# Patient Record
Sex: Female | Born: 1970 | Race: White | Hispanic: No | Marital: Married | Smoking: Never smoker
Health system: Southern US, Academic
[De-identification: ages and names within clinical notes are randomized; demographics above are authoritative.]

## PROBLEM LIST (undated history)

## (undated) DIAGNOSIS — G2581 Restless legs syndrome: Secondary | ICD-10-CM

## (undated) DIAGNOSIS — F329 Major depressive disorder, single episode, unspecified: Secondary | ICD-10-CM

## (undated) HISTORY — DX: Major depressive disorder, single episode, unspecified: F32.9

## (undated) HISTORY — DX: Restless legs syndrome: G25.81

---

## 1993-10-09 ENCOUNTER — Other Ambulatory Visit (HOSPITAL_COMMUNITY): Payer: Self-pay

## 2015-08-21 IMAGING — MG 2D SCREENING MAMMO ONLY
1 series · 4 of 4 positions shown · non-contrast
Comparison: 

------------- REPORT [id number] -------------
TIGER, ALREEM

TIGER, AZETA
Exam:  
Screening digital mammogram with CAD
INDICATION: Annual screening.

[Series 441: R CC · oblique · right · 4 of 6 slices shown]
[im 1/6]
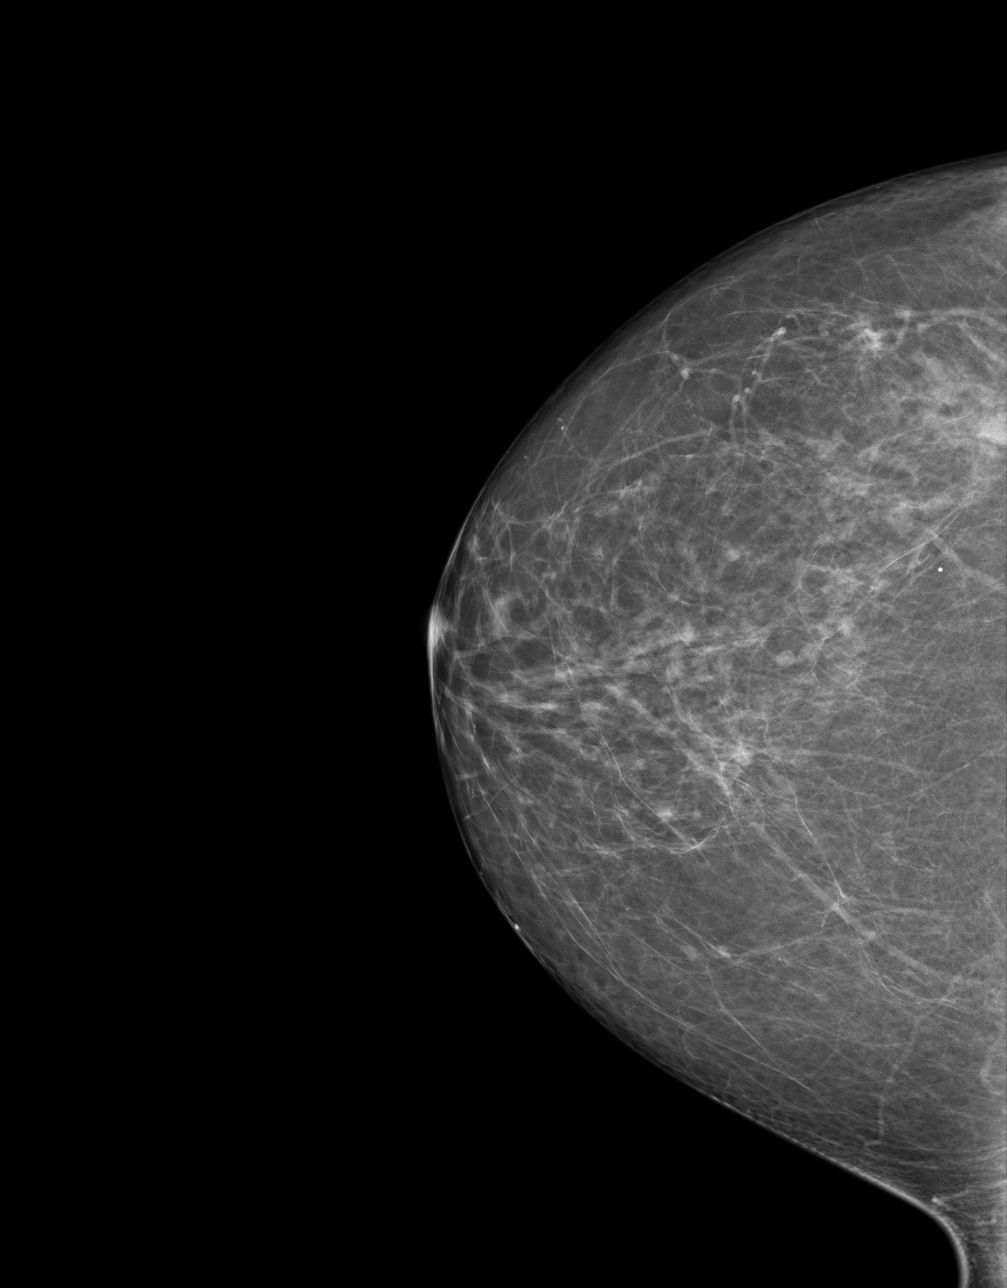
[im 2/6]
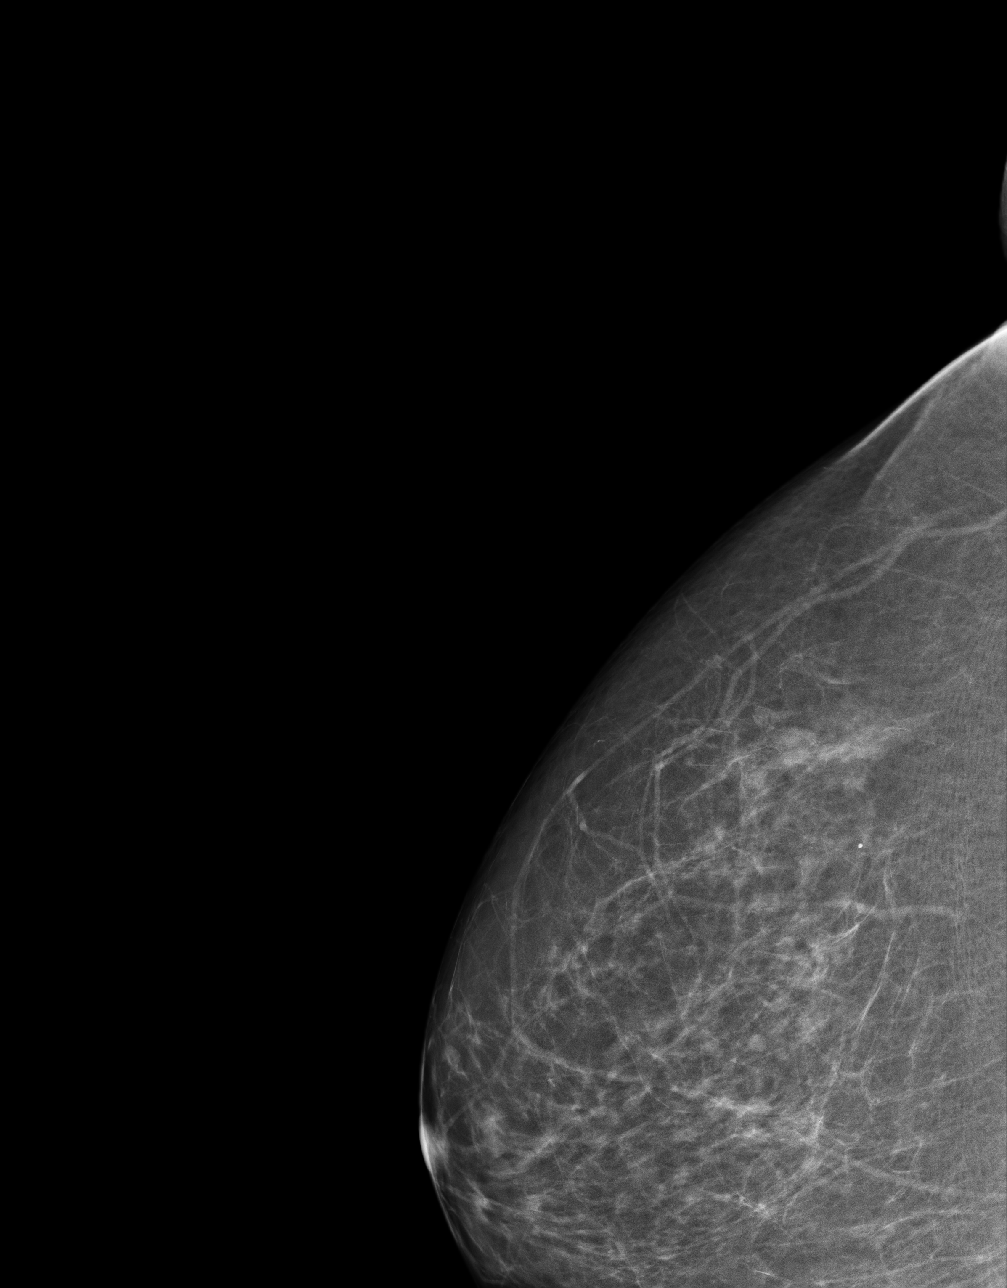
[im 4/6]
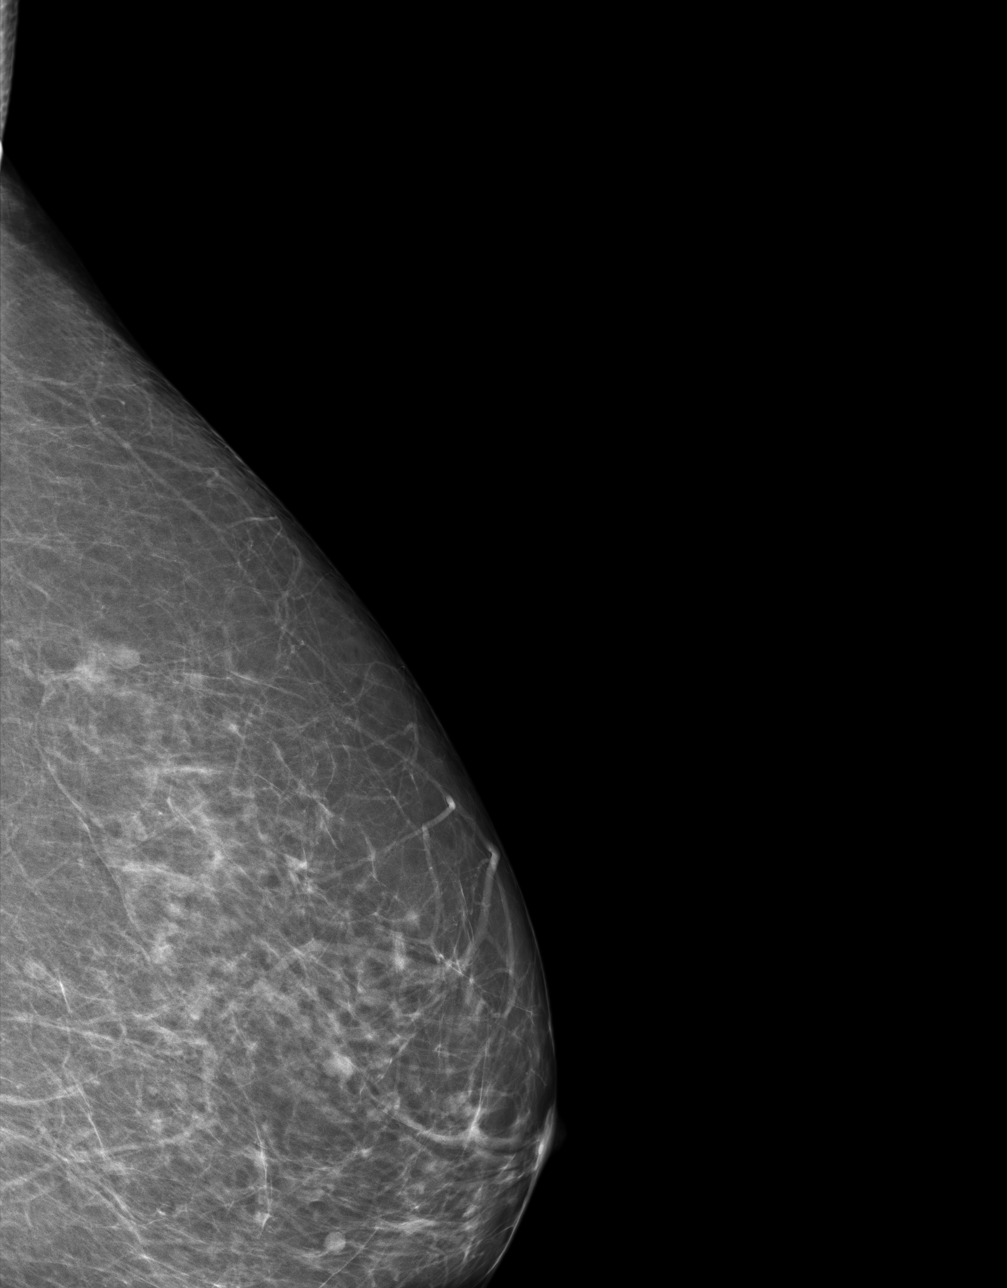
[im 6/6]
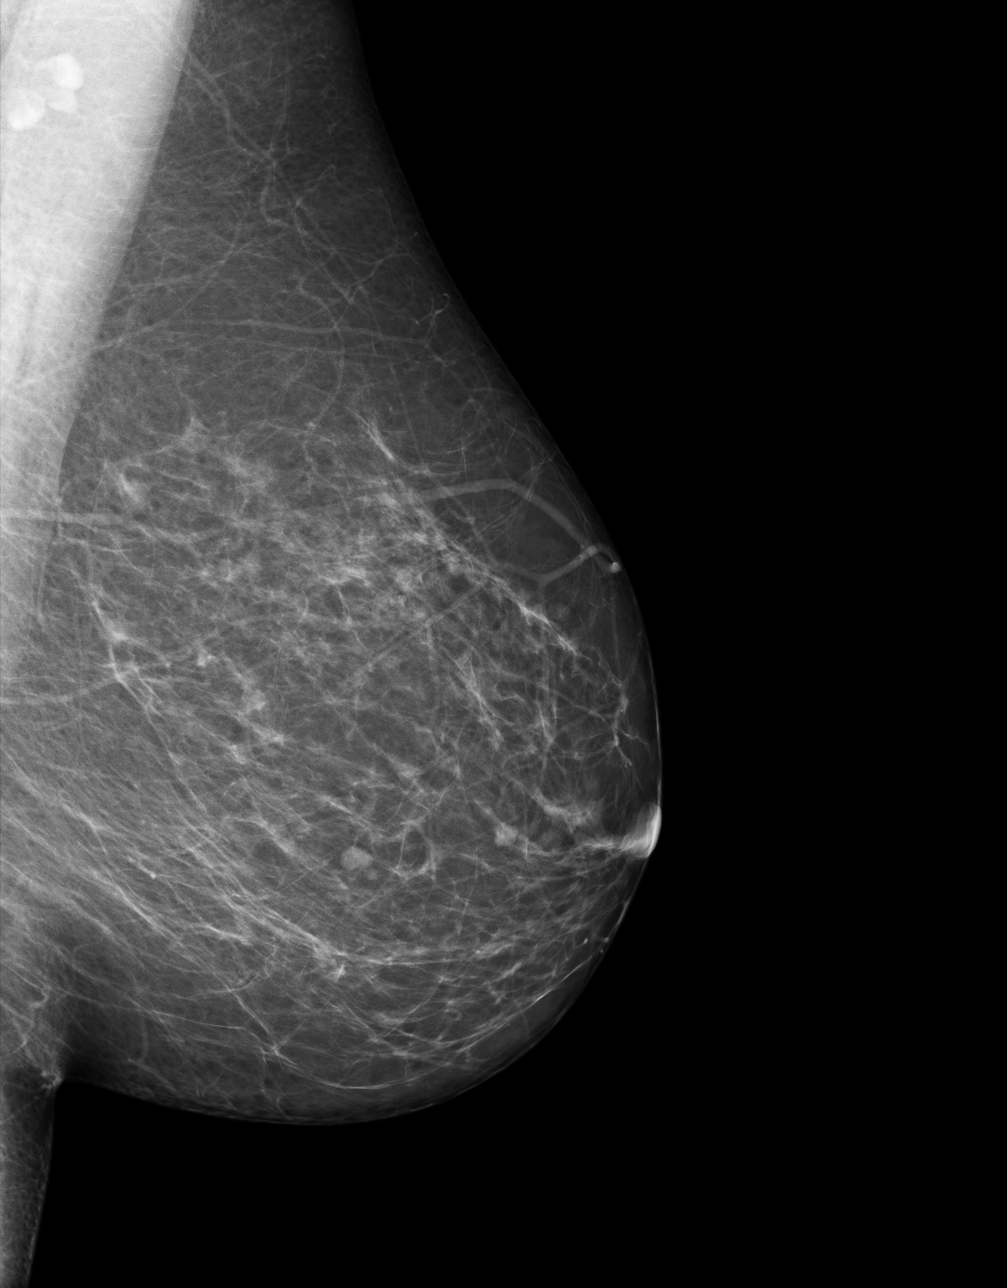

[4 of 4 positions shown; findings below may reference images not displayed]

FINDINGS: Breast parenchyma is heterogeneously dense. Benign appearing nodules within both breasts are again seen. There is no suspicious cluster of microcalcifications, architectural distortion, skin thickening or nipple retraction.
IMPRESSION: 1.
BIRADS 2-Benign findings. Patient has been added in a reminder system with a
target date for the next screening mammography.
2.
DENSITY CODE   C (Heterogeneously dense)
Final Assessment Code:
Bi-Rads 2 

BI-RADS 0
Need additional imaging evaluation
BI-RADS 1
Negative mammogram
BI-RADS 2
Benign finding
BI-RADS 3
Probably benign finding: short-interval follow-up suggested
BI-RADS 4
Suspicious abnormality:  biopsy should be considered
BI-RADS 5
Highly suggestive of malignancy; appropriate action should be taken
BI-RADS 6
Known Biopsy-proven Malignancy  Appropriate action should be taken
NOTE:
In compliance with Federal regulations, the results of this mammogram are being sent to the patient.

------------- REPORT [id number] -------------
[facility]
[address]
Daina Ms.MCCLENDON, TUAN:
We wish to report the following on your recent mammography examination. We are sending a report to your referring physician or other health care provider. 
(       Normal/Negative:
No evidence of cancer.
This statement is mandated by the [place], Department of Health.
Your examination was performed by one of our technologists, who are registered radiological technologists and also specially certified in mammography:
___
Parlak, Edaly (M)
___
Dang, Mcalex (M)

Your mammogram was interpreted by our radiologist.

( 
Sofeine Made, M.D.

(Annual Breast Examination by a physician or other health care provider
(Annual Mammography Screening beginning at age 40
(Monthly Breast Self Examination

## 2018-07-11 IMAGING — MG 3D SCREENING MAMMO BIL W/CAD
5 series · 7 of 24 positions shown · non-contrast
Comparison: 01/10/2017

------------- REPORT [id number] -------------
[facility]
[address]
Daina Ms.MCCLENDON, TUAN:
We wish to report the following on your recent mammography examination. We are sending a report to your referring physician or other health care provider. 
(       Normal/Negative:
No evidence of cancer.
This statement is mandated by the [place], Department of Health.
Your examination was performed by one of our technologists, who are registered radiological technologists and also specially certified in mammography:
___
Parlak, Edaly (M)
Dang, Mcalex (M)

Your mammogram was interpreted by our radiologist.
( 
Sofeine Made, M.D.
(Annual Breast Examination by a physician or other health care provider
(Annual Mammography Screening beginning at age 40
(Monthly Breast Self Examination
------------- REPORT [id number] -------------
SAMUEL GIHAHE, JEAN CLAUDE MANIRAKIZA
EXAM:  3D BILATERAL ANNUAL SCREENING DIGITAL MAMMOGRAM WITH TOMOSYNTHESIS AND CAD
INDICATION: Screening.

[R CC · right · 0.10mm/px · 2 of 2 slices shown]
[im 1/2]
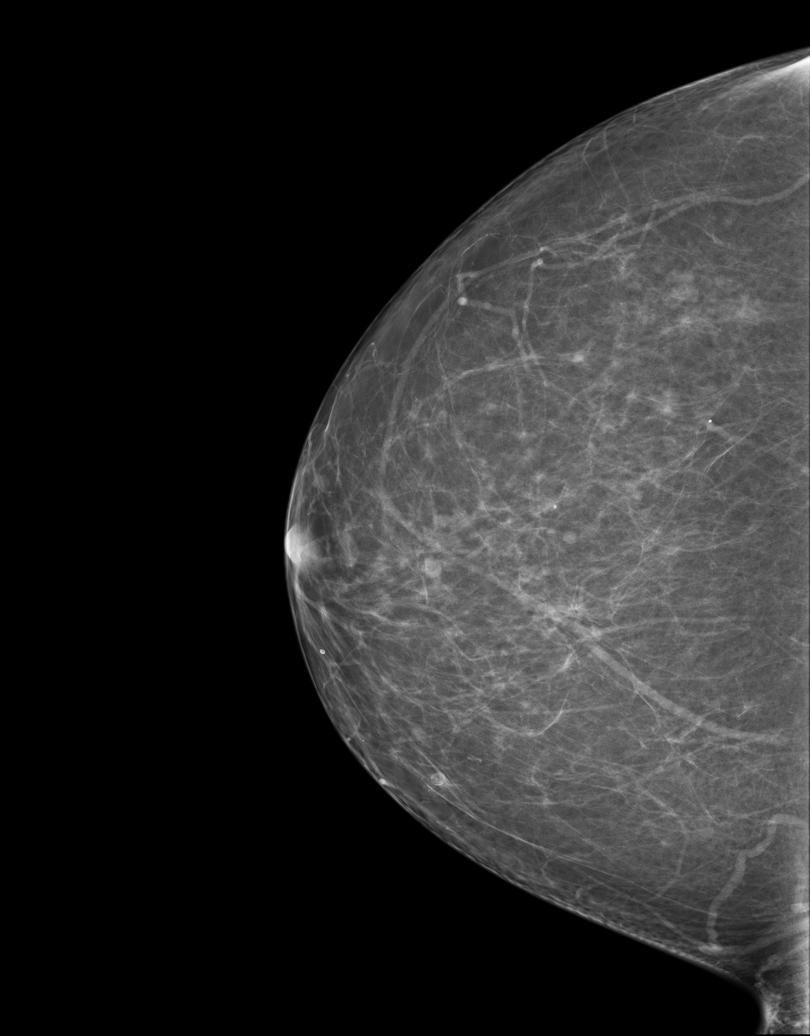
[im 2/2]
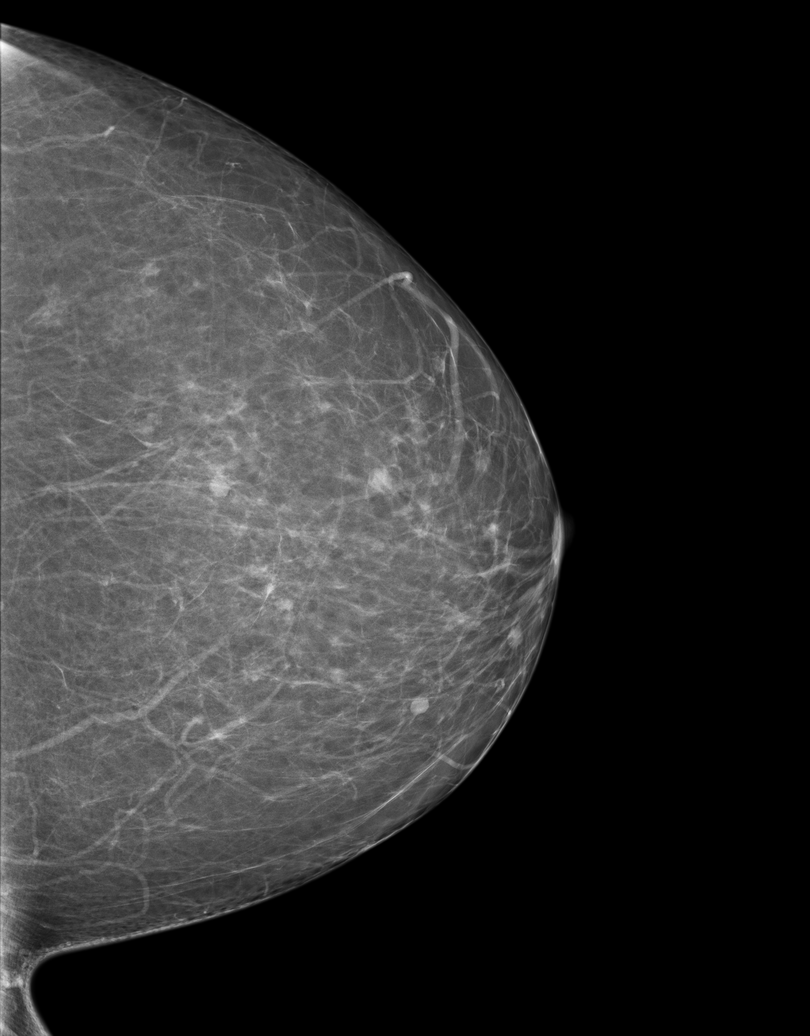

[3D SCREENING MAMMO BIL W/CAD · 2 acquisitions, 2 frames shown (1 of 2)]
[im 1/2]
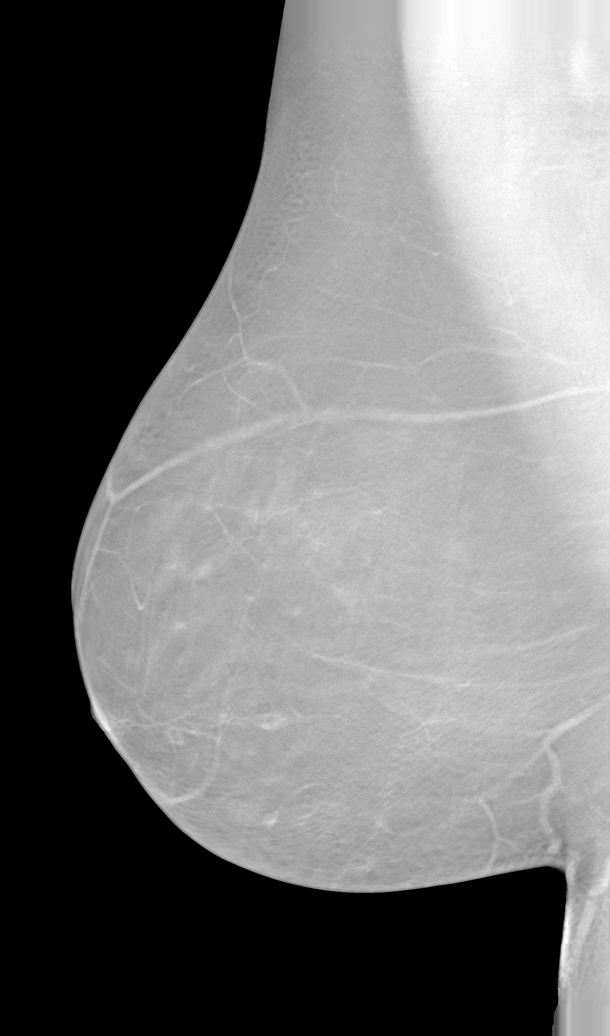
[im 2/2]
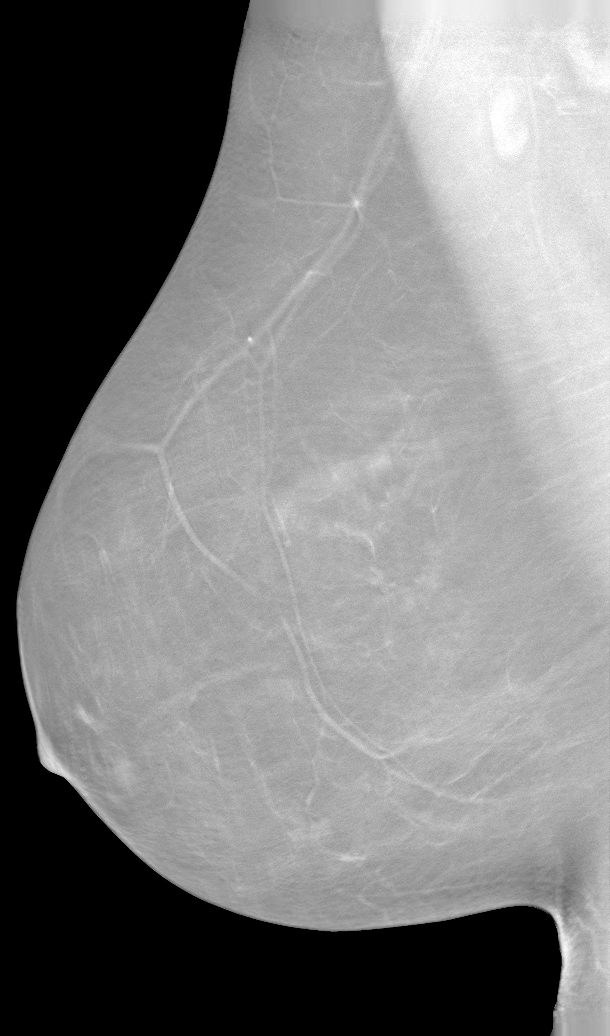

[R]
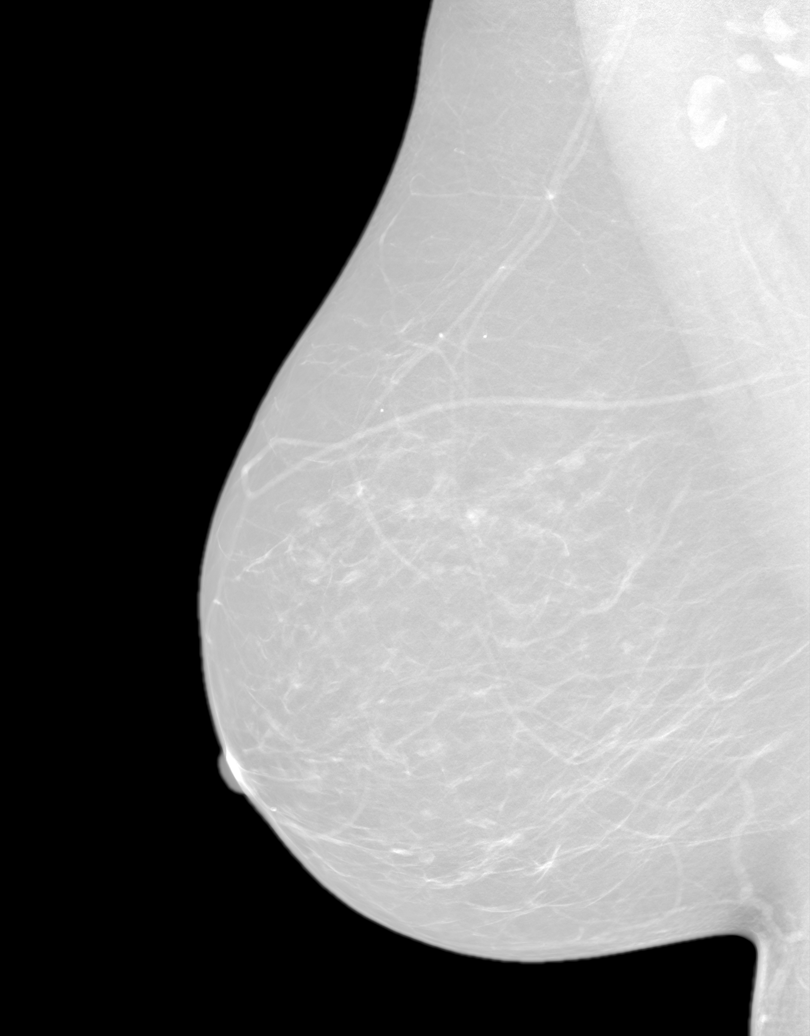

[3D SCREENING MAMMO BIL W/CAD (2 of 2) · tomo slice 15/91.0]
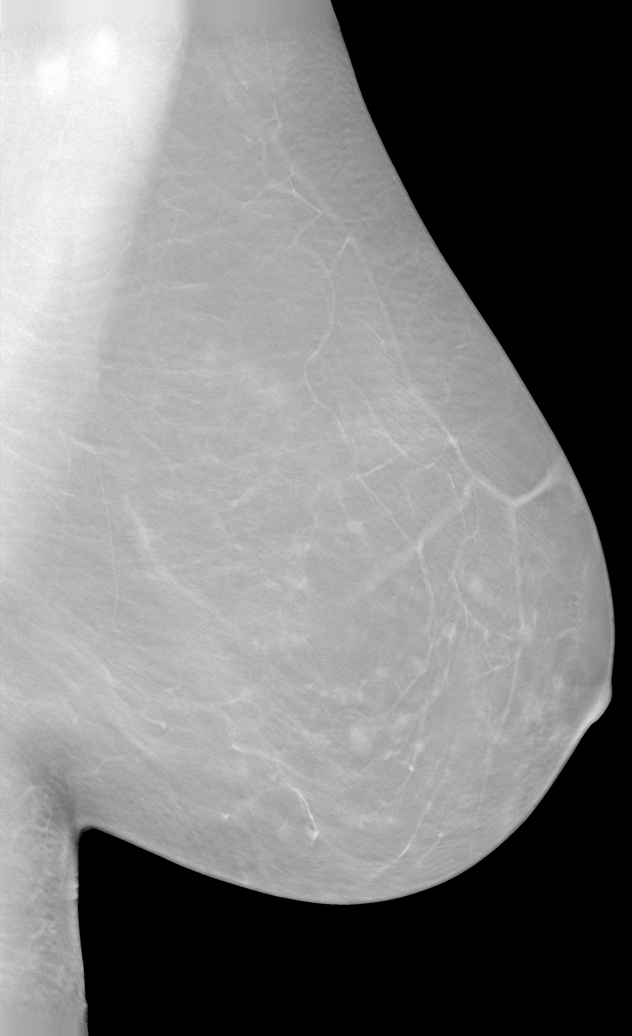

[L]
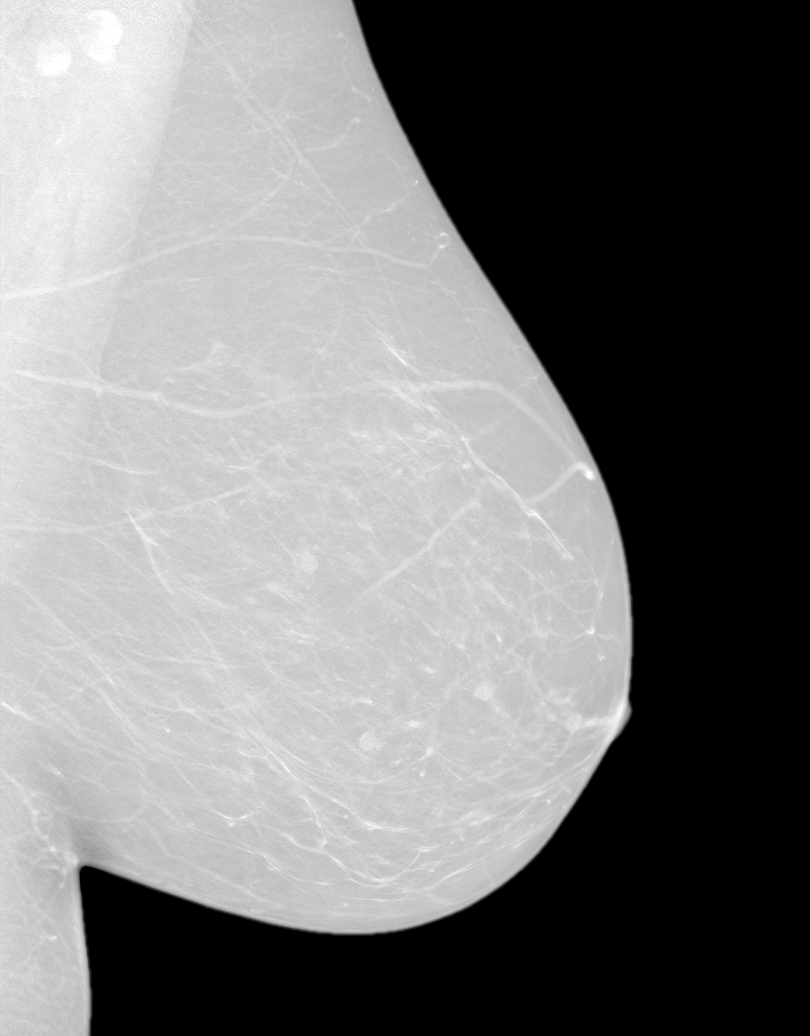

[7 of 24 positions shown; findings below may reference images not displayed]

FINDINGS: There are scattered fibroglandular elements.  There is no mass or suspicious cluster of microcalcifications.   There is no architectural distortion, skin thickening or nipple retraction.
IMPRESSION: 1.  BIRADS 2-Benign findings. Patient has been added in a reminder system with a target date for the next screening mammography.

2.  DENSITY CODE –  B (Scattered areas of fibroglandular density). 

Final Assessment Code:

Bi-Rads 2 

BI-RADS 0
Need additional imaging evaluation

BI-RADS 1
Negative mammogram

BI-RADS 2
Benign finding

BI-RADS 3
Probably benign finding: short-interval follow-up suggested

BI-RADS 4
Suspicious abnormality:  biopsy should be considered

BI-RADS 5
Highly suggestive of malignancy; appropriate action should be taken

BI-RADS 6
Known Biopsy-proven Malignancy – Appropriate action should be taken

NOTE:
In compliance with Federal regulations, the results of this mammogram are being sent to the patient.

## 2019-06-13 IMAGING — CT CT ABDOMEN & PELVIS WITHOUT DYE
2 of 4 series · 17 of 46 positions shown, 19 images · non-contrast
Comparison: None available.

EXAM:  CT ABDOMEN & PELVIS WITHOUT DYE
INDICATION: Severe left-sided back pain for 2 days.
TECHNIQUE: Axial CT imaging of the abdomen and pelvis was performed without oral or intravenous contrast as per renal stone protocol. Images were reviewed in multiple windows and projections. Exam was performed using 1 or more of the following dose reduction techniques: Automated exposure control, adjustment of the mA and/or kV according to patient size, or the use of iterative reconstruction technique.

[cor · coronal · 0.63mm/px · 3 of 118 slices shown]
[im 40/118  soft-tissue]
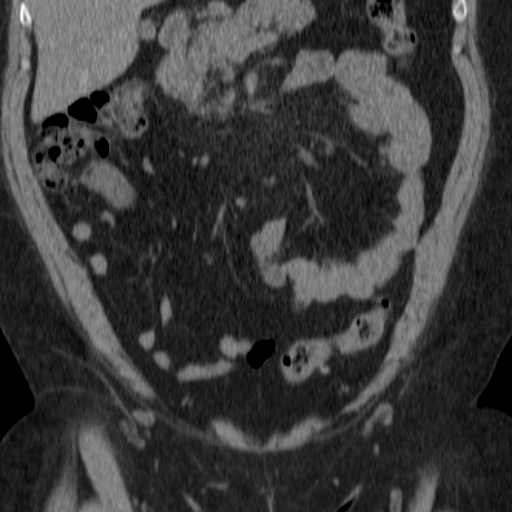
[im 53/118  soft-tissue]
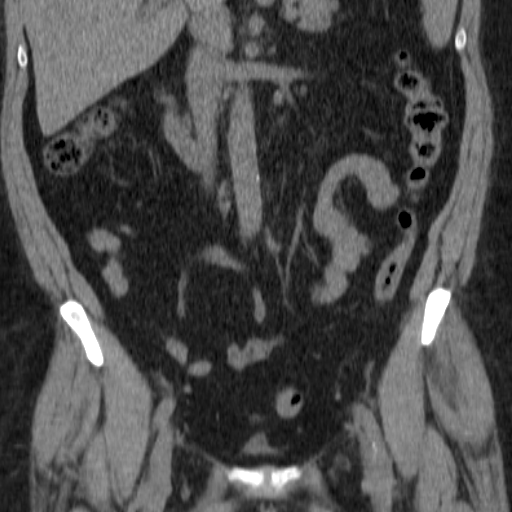
[im 66/118  soft-tissue]
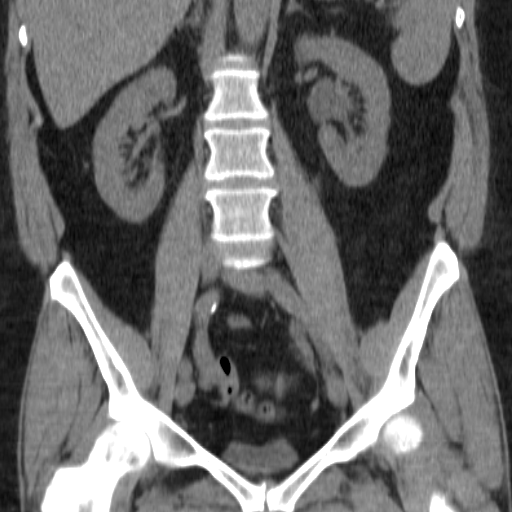

[axial · axial · 0.82mm/px · z∈[+616,+1016]mm · 14 of 226 slices shown, 16 images]
[im 13/226  soft-tissue]
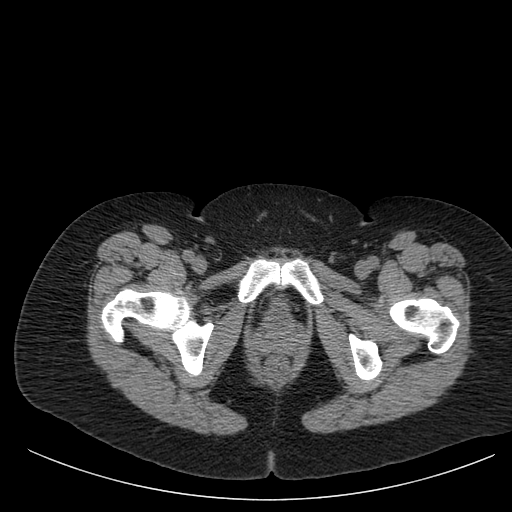
[im 13/226  bone]
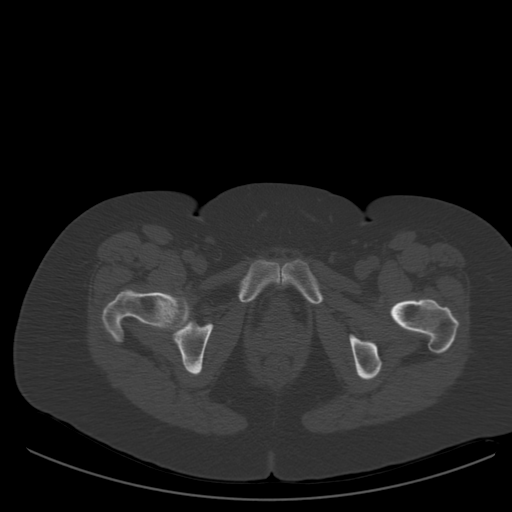
[im 26/226  soft-tissue]
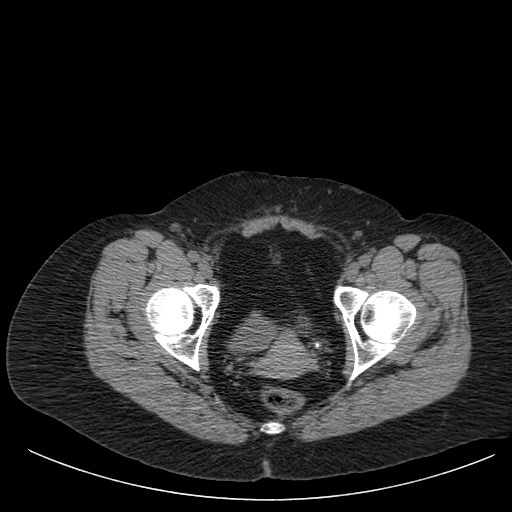
[im 51/226  soft-tissue]
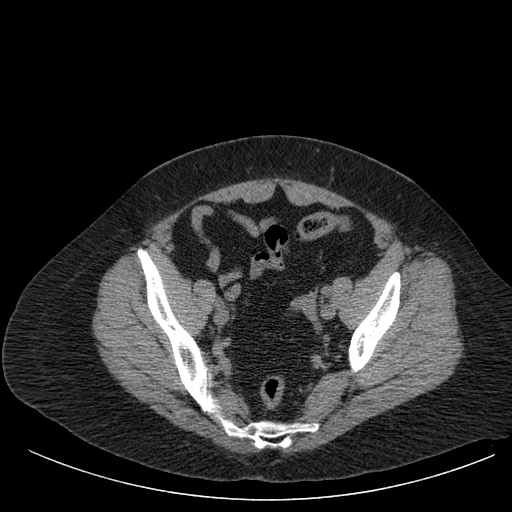
[im 63/226  soft-tissue]
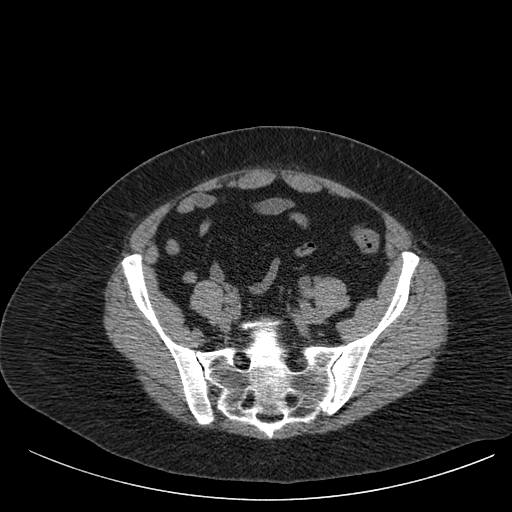
[im 76/226  soft-tissue]
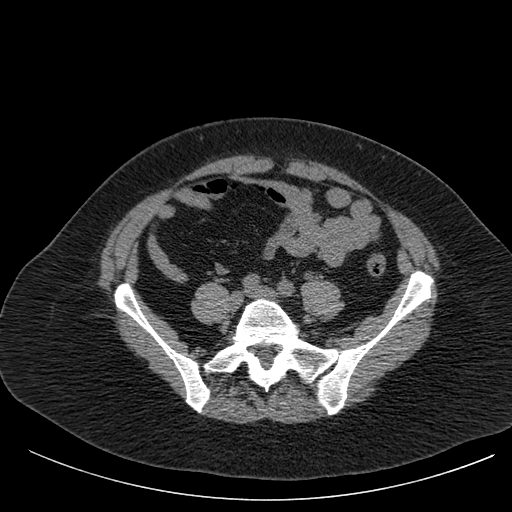
[im 88/226  soft-tissue]
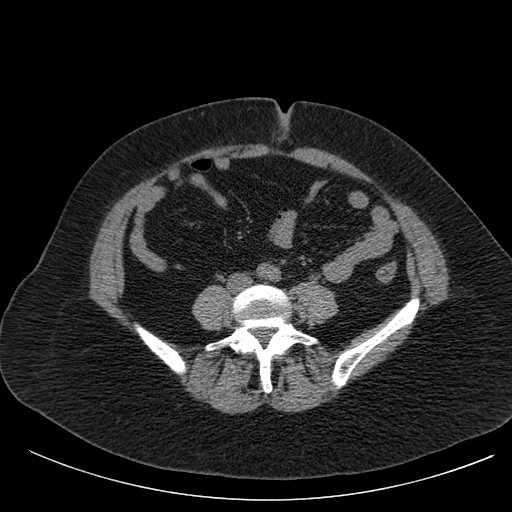
[im 101/226  soft-tissue]
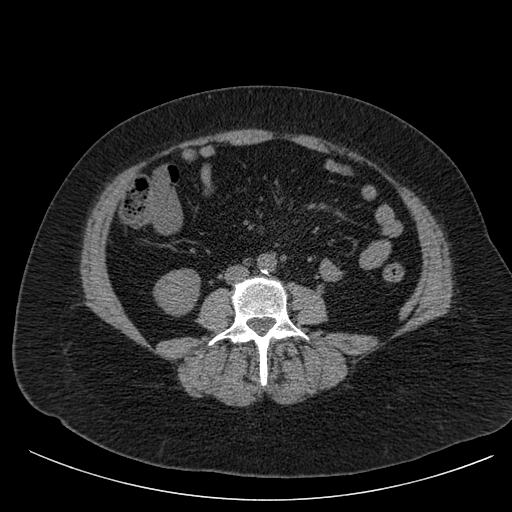
[im 126/226  soft-tissue]
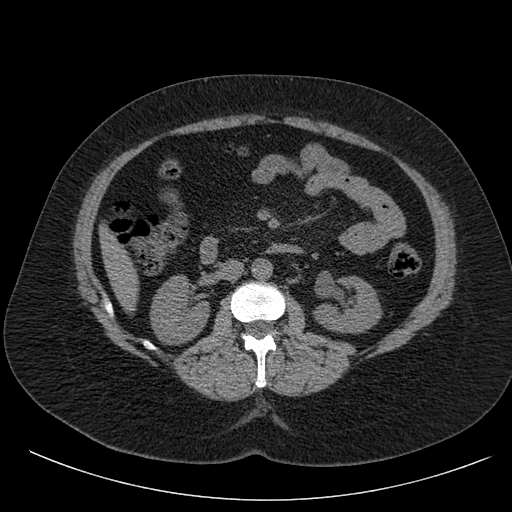
[im 138/226  soft-tissue]
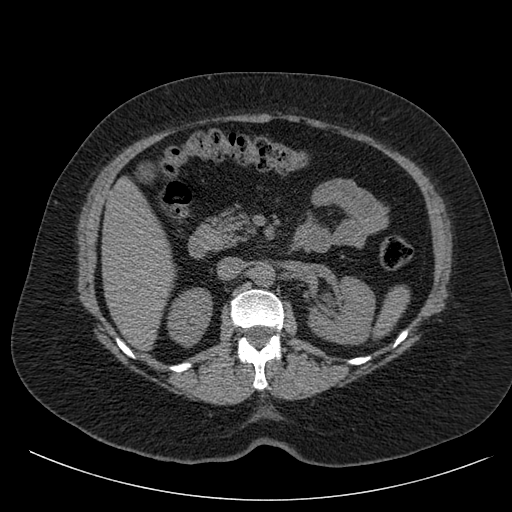
[im 138/226  bone]
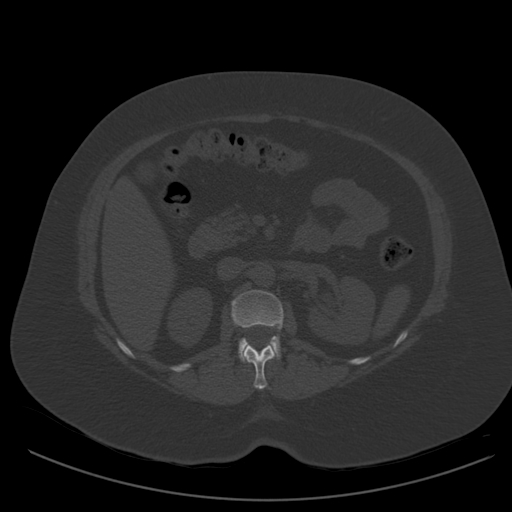
[im 151/226  soft-tissue]
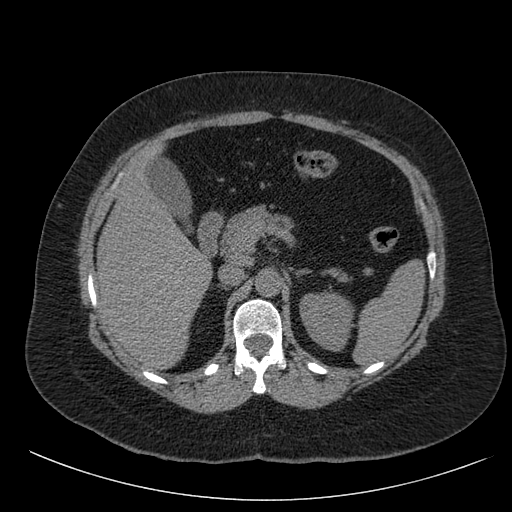
[im 163/226  soft-tissue]
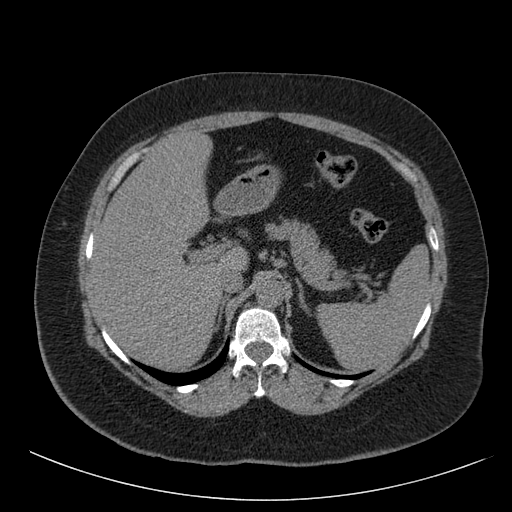
[im 176/226  soft-tissue]
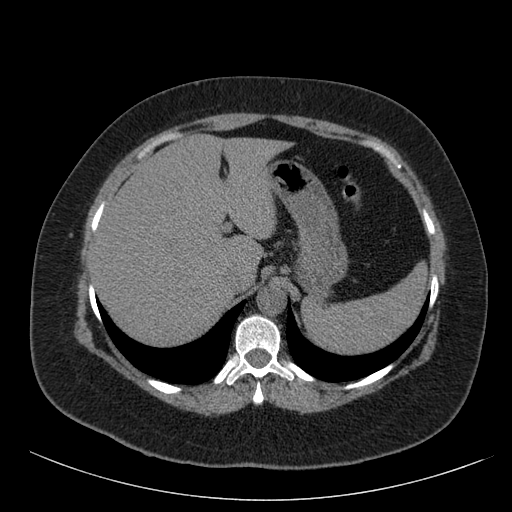
[im 201/226  soft-tissue]
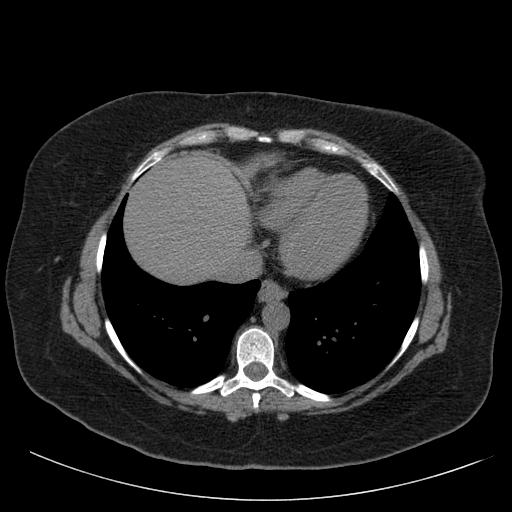
[im 213/226  soft-tissue]
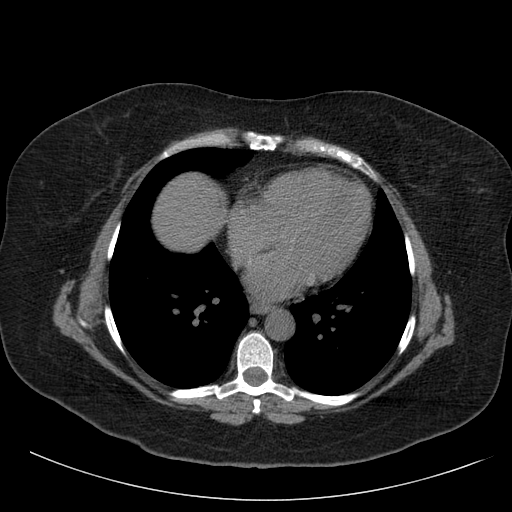

[17 of 46 positions shown; findings below may reference images not displayed]

FINDINGS: Limited visualization of lung bases is unremarkable. There is no pleural or pericardial effusion.

There is a 4.7 mm left distal ureteral calculus resulting in mild obstructive changes. This is very close to the ureterovesical junction. No radiopaque renal calculi are seen. Unenhanced liver, gallbladder, spleen, pancreas, adrenal glands and right kidney are normal.

Bowel loops are normal in course and caliber, there is no obstruction or free air. Appendix is normal. There is no ascites or adenopathy. There are scattered vascular calcifications. There is severe degenerative disc disease at L5-S1 level.
IMPRESSION: A 4.7 mm left distal ureteral calculus resulting in mild obstructive changes.

## 2021-01-09 IMAGING — MG 3D SCREENING MAMMO BIL W/CAD & TOMO
5 series · 7 of 24 positions shown · non-contrast
Comparison: 12/03/2020

------------- REPORT [id number] -------------
[facility]
[address]
Tiger Ms.LAUER, MARY LYNN:
We wish to report the following on your recent mammography examination. We are sending a report to your referring physician or other health care provider. 
(       Normal/Negative:
No evidence of cancer.
This statement is mandated by the [place], Department of Health.
Your examination was performed by one of our technologists, who are registered radiological technologists and also specially certified in mammography:
___
Markland, Marjuan (M)

Your mammogram was interpreted by our radiologist.
( 
Collette Sedman, M.D.
(Annual Breast Examination by a physician or other health care provider
(Annual Mammography Screening beginning at age 40
(Monthly Breast Self Examination
------------- REPORT [id number] -------------
﻿
HERSHMAN, SENAIDA
EXAM:  3D BILATERAL ANNUAL SCREENING DIGITAL MAMMOGRAM WITH CAD AND TOMOSYNTHESIS
INDICATION: Screening.

[R]
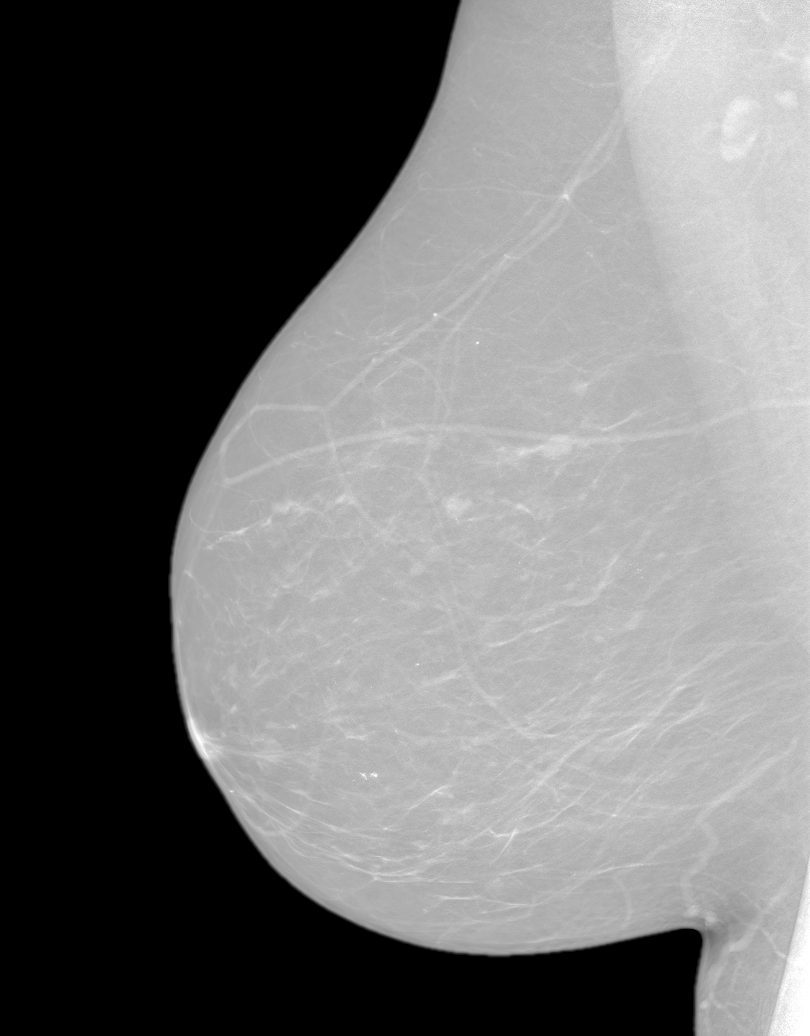

[L]
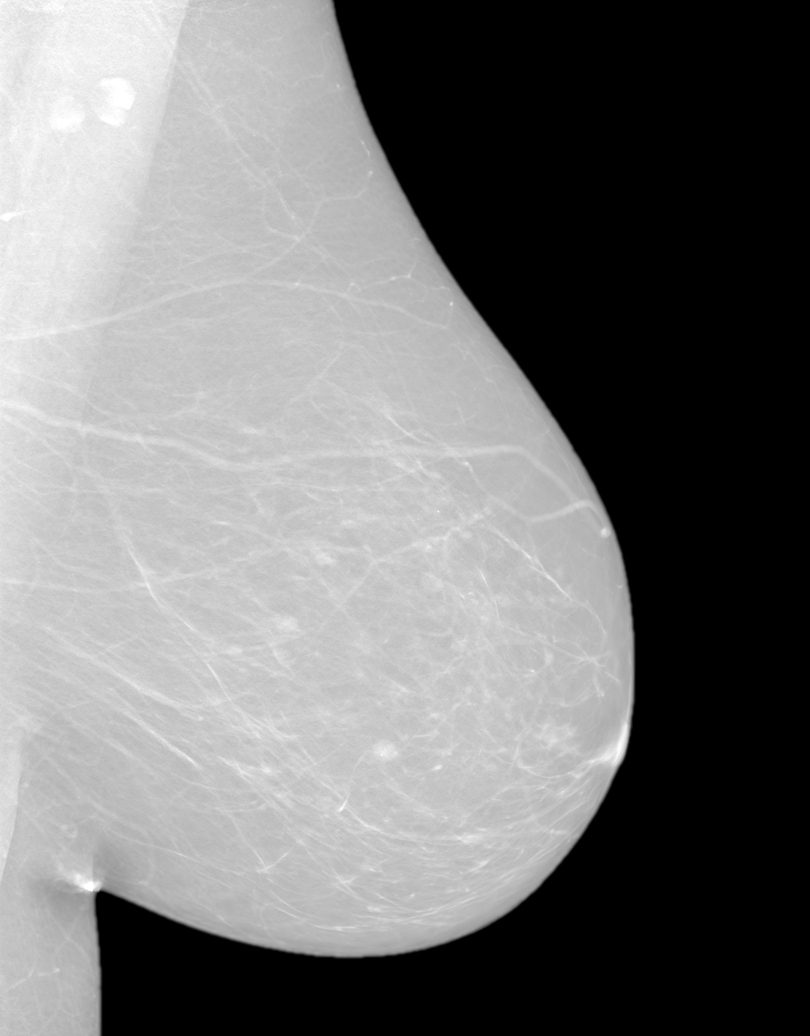

[R CC tomo · right · 0.10mm/px · 2 of 2 slices shown]
[im 1/2]
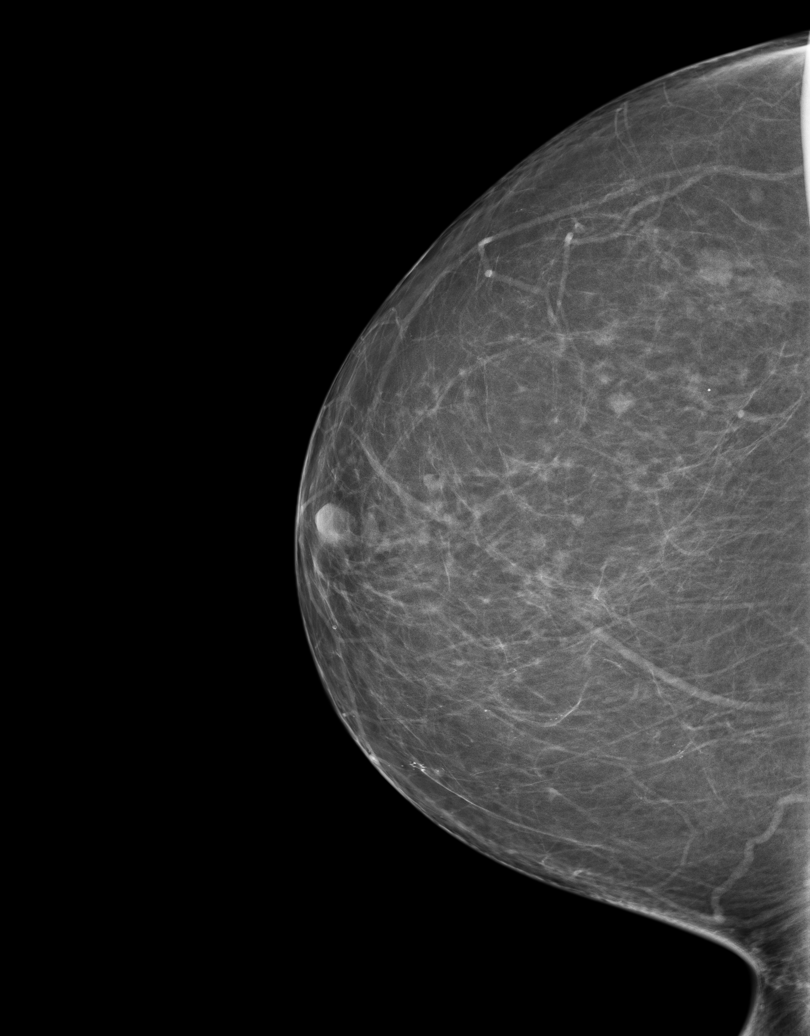
[im 2/2]
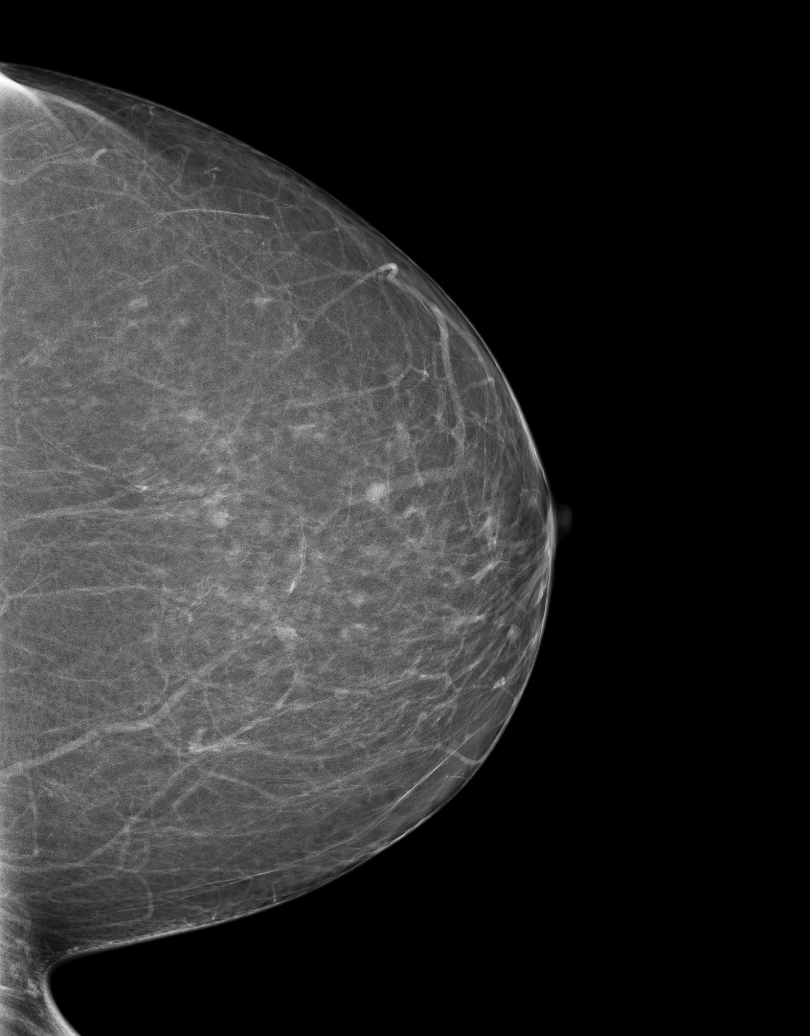

[3D SCREENING MAMMO BIL W/CAD & TOMO tomo · 2 acquisitions, 2 frames shown (1 of 2)]
[im 1/2]
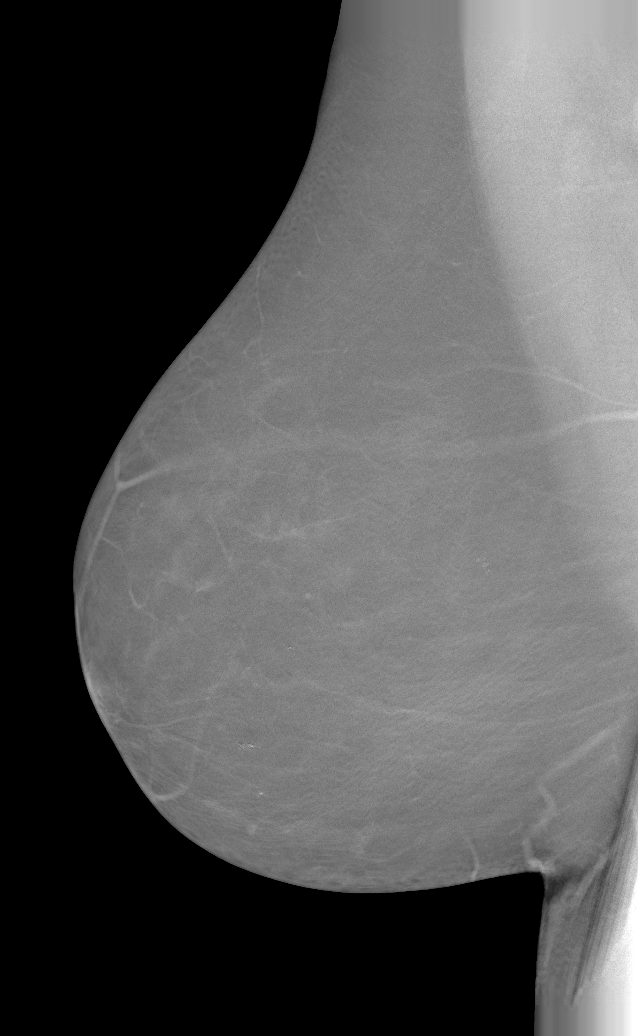
[im 2/2]
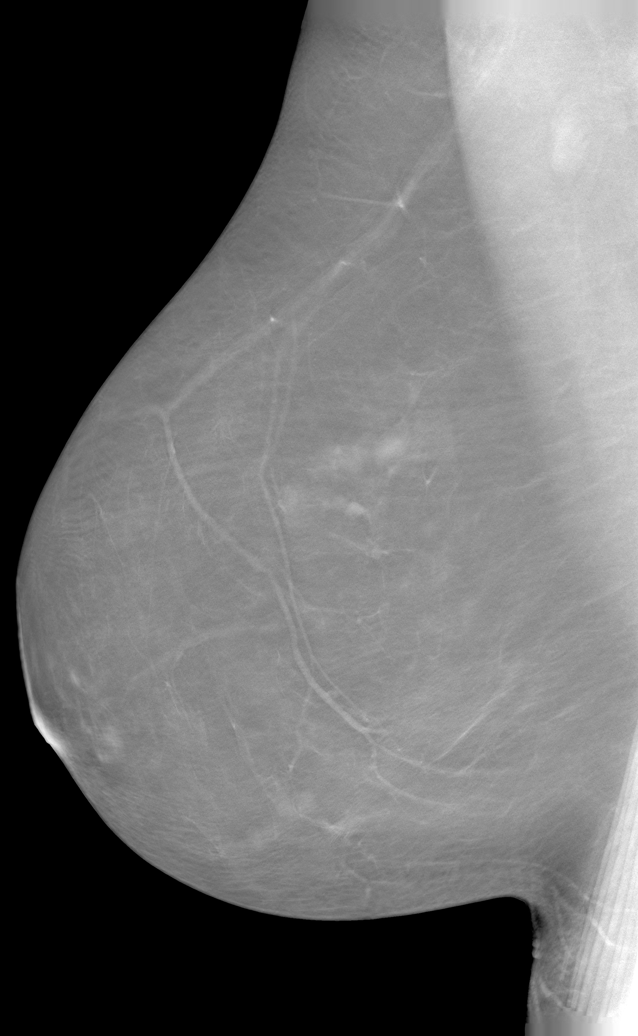

[3D SCREENING MAMMO BIL W/CAD & TOMO tomo (2 of 2) · tomo slice 16/99.0]
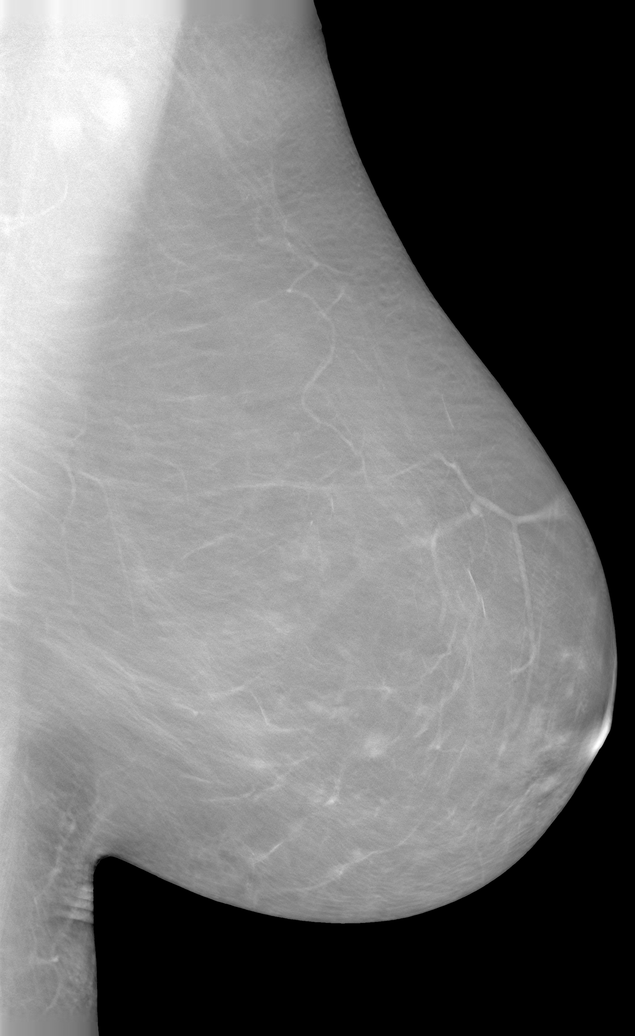

[7 of 24 positions shown; findings below may reference images not displayed]

FINDINGS: There are scattered fibroglandular elements.  There is no mass or suspicious cluster of microcalcifications.   There is no architectural distortion, skin thickening or nipple retraction.
IMPRESSION: 1.  BIRADS 2-Benign findings. Patient has been added in a reminder system with a target date for the next screening mammography.

2.  DENSITY CODE – B (Scattered areas of fibroglandular density). 

Final Assessment Code:

Bi-Rads 2 

BI-RADS 0
 Need additional imaging evaluation.

BI-RADS 1
 Negative mammogram.

BI-RADS 2
 Benign finding.

BI-RADS 3
 Probably benign finding; short-interval follow-up suggested.

BI-RADS 4
 Suspicious abnormality; biopsy should be considered.

BI-RADS 5
 Highly suggestive of malignancy; appropriate action should be taken.

BI-RADS 6
 Known biopsy-proven malignancy; appropriate action should be taken.

NOTE:
In compliance with Federal regulations, the results of this mammogram are being sent to the patient.

## 2021-05-29 ENCOUNTER — Ambulatory Visit (HOSPITAL_COMMUNITY)
Admission: RE | Admit: 2021-05-29 | Discharge: 2021-05-29 | Disposition: A | Discharge status: Home / Self Care | Payer: Self-pay | Source: Ambulatory Visit

## 2021-12-02 ENCOUNTER — Other Ambulatory Visit (HOSPITAL_COMMUNITY): Payer: Self-pay | Admitting: PSYCHIATRY AND NEUROLOGY-NEUROLOGY

## 2021-12-02 DIAGNOSIS — R260 Ataxic gait: Secondary | ICD-10-CM

## 2021-12-05 ENCOUNTER — Ambulatory Visit (HOSPITAL_COMMUNITY)
Admission: RE | Admit: 2021-12-05 | Discharge: 2021-12-05 | Disposition: A | Discharge status: Home / Self Care | Payer: Self-pay | Source: Ambulatory Visit

## 2021-12-05 IMAGING — MR MRI BRAIN W/ IAC W & W/O CONTRAST
11 of 12 series · 41 of 48 positions shown · IV contrast (gadavist)
Comparison: Brain MRI dated 05/29/2021.

﻿EXAM:  MRI.[facility]   MRI BRAIN W/ IAC W & W/O CONTRAST
INDICATION: R27.0.
TECHNIQUE: Multiplanar multisequential MRI of the brain and internal auditory canals was performed without and with 5 mL of Gadavist.

[Series 6: DWI · axial · 5.0mm · 1.35mm/px · z∈[-75,+51]mm · 13 of 88 slices shown (1 of 3)]
[im 1/88]
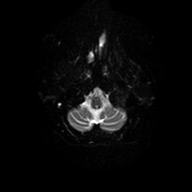
[im 8/88]
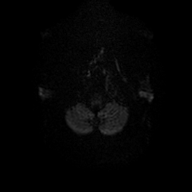
[im 15/88]
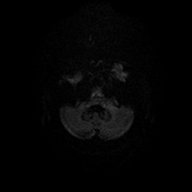
[im 22/88]
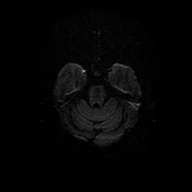
[im 30/88]
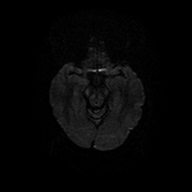
[im 37/88]
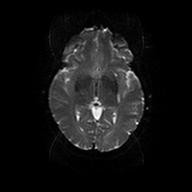
[im 44/88]
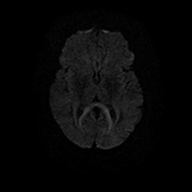
[im 51/88]
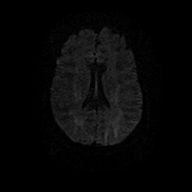
[im 59/88]
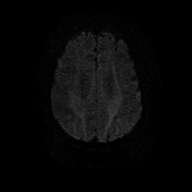
[im 66/88]
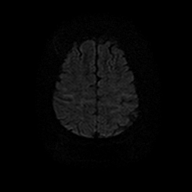
[im 73/88]
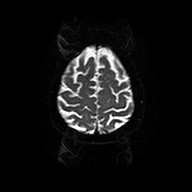
[im 80/88]
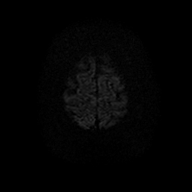
[im 88/88]
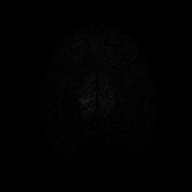

[Series 7: DWI · axial · 5.0mm · 1.35mm/px · z∈[-75,+51]mm · 3 of 22 slices shown (2 of 3)]
[im 1/22]
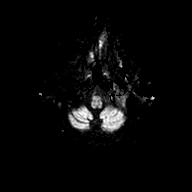
[im 11/22]
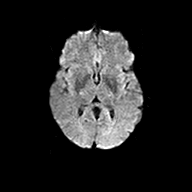
[im 22/22]
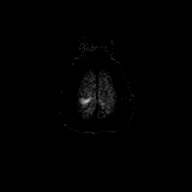

[Series 8: DWI · axial · 5.0mm · 1.35mm/px · z∈[-75,+51]mm · 3 of 22 slices shown (3 of 3)]
[im 1/22]
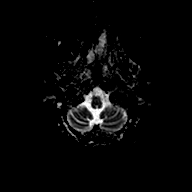
[im 11/22]
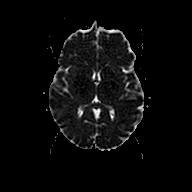
[im 22/22]
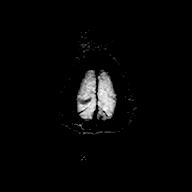

[Series 9: FLAIR · sagittal · 4.0mm · 0.75mm/px · 3 of 26 slices shown (1 of 2)]
[im 1/26]
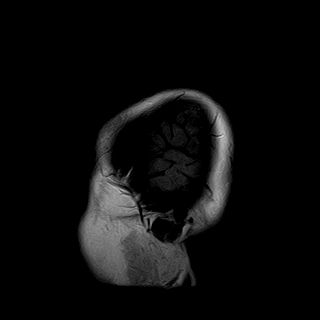
[im 13/26]
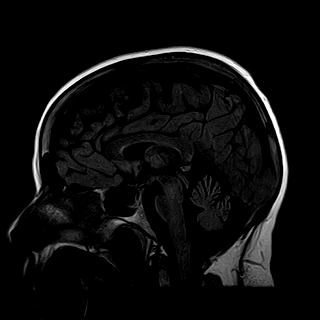
[im 26/26]
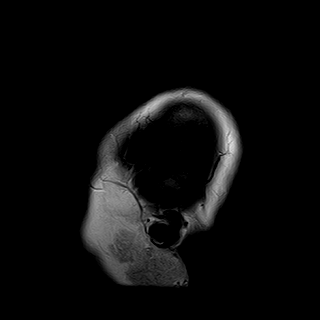

[Series 10: T2 · axial · 4.0mm · 0.43mm/px · z∈[-80,+60]mm · 5 of 36 slices shown]
[im 1/36]
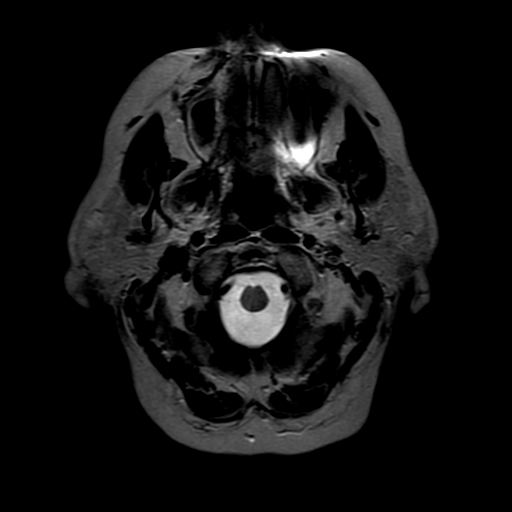
[im 9/36]
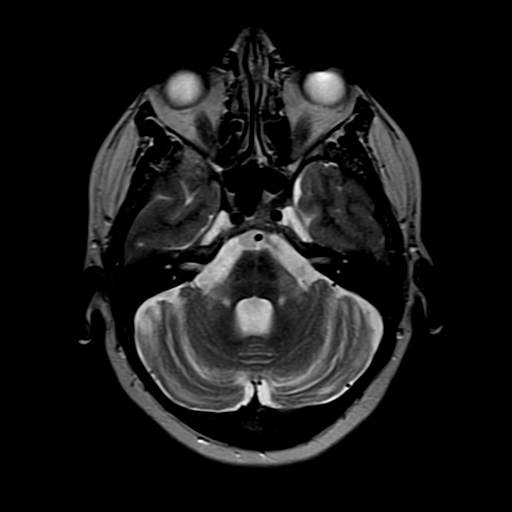
[im 18/36]
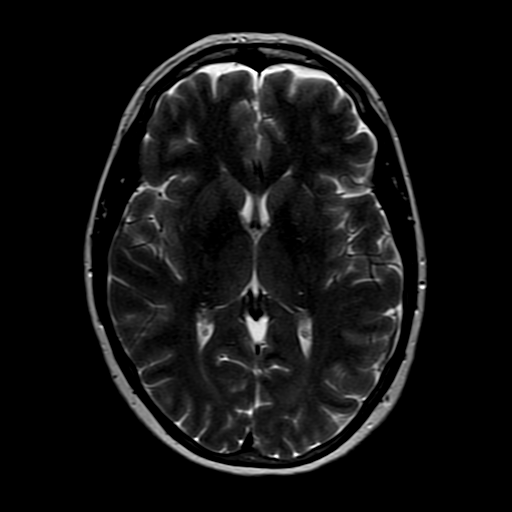
[im 27/36]
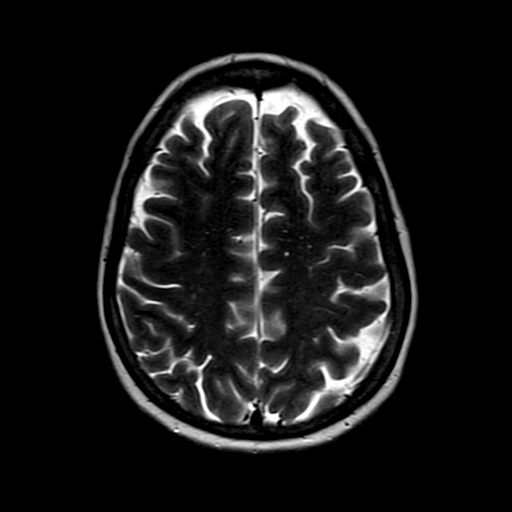
[im 36/36]
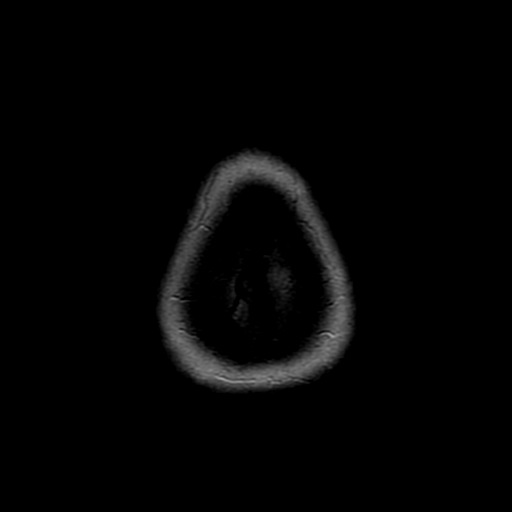

[Series 11: FLAIR · axial · 5.0mm · 0.69mm/px · z∈[-73,+53]mm · 3 of 22 slices shown (2 of 2)]
[im 1/22]
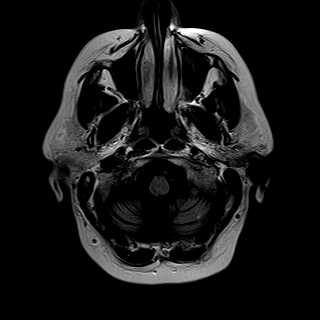
[im 11/22]
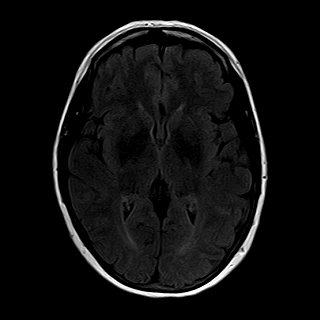
[im 22/22]
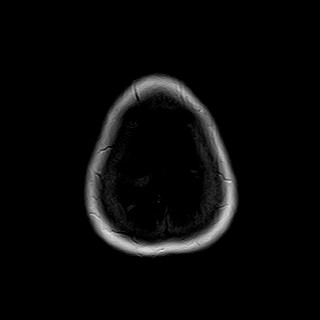

[Series 12: T1 · axial · 5.0mm · 0.69mm/px · z∈[-73,+53]mm · 3 of 22 slices shown (1 of 2)]
[im 1/22]
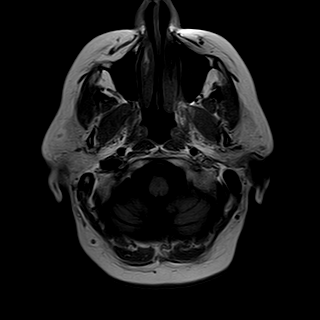
[im 11/22]
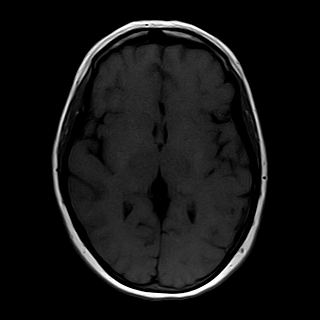
[im 22/22]
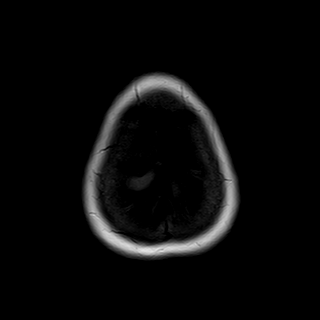

[Series 14: T1 · axial · 3.0mm · 0.47mm/px · 1 of 11 slices shown (2 of 2)]
[im 1/11]
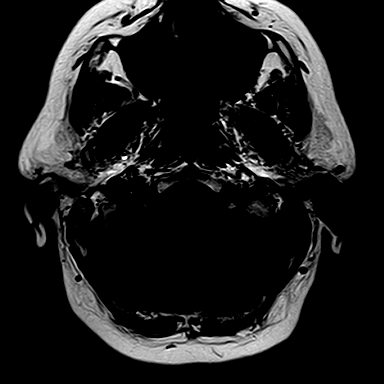

[Series 15: T1 post-contrast · axial · 3.0mm · 0.47mm/px · 1 of 11 slices shown (1 of 3)]
[im 1/11]
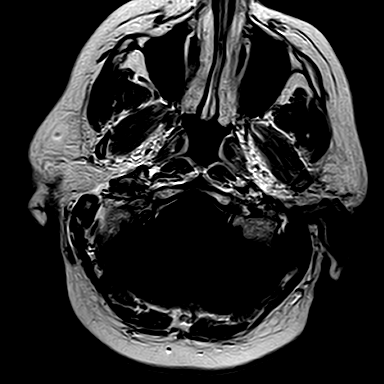

[Series 16: T1 post-contrast · axial · 4.0mm · 0.43mm/px · z∈[-80,+60]mm · 5 of 36 slices shown (2 of 3)]
[im 1/36]
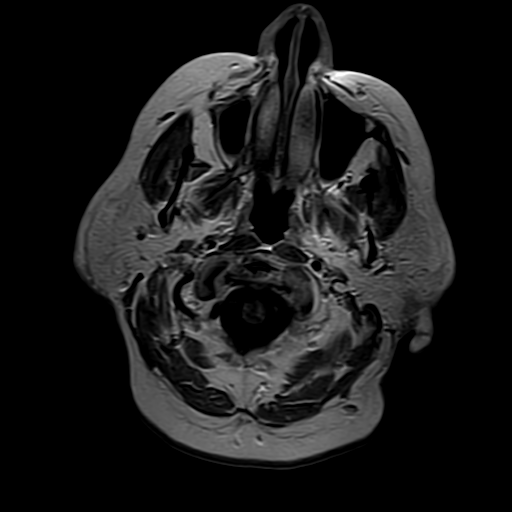
[im 9/36]
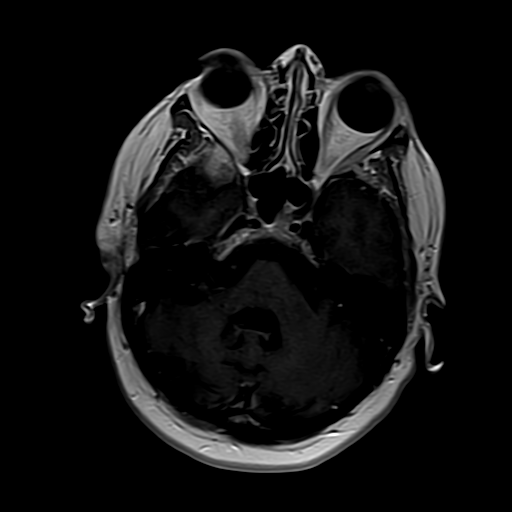
[im 18/36]
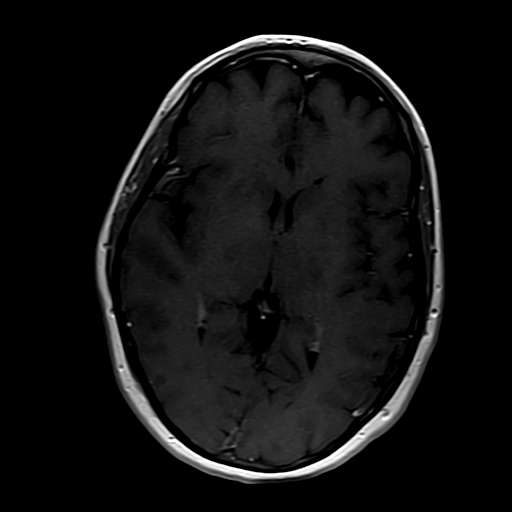
[im 27/36]
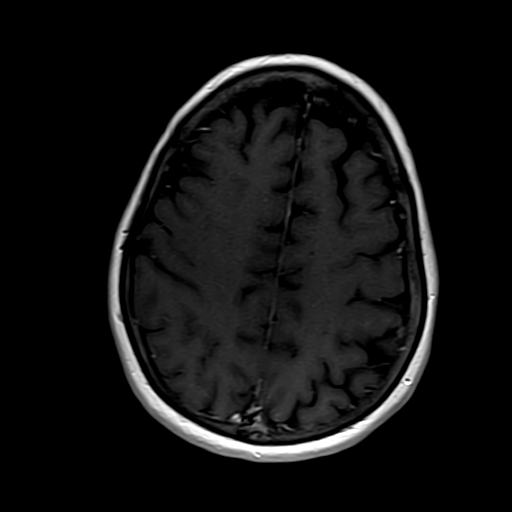
[im 36/36]
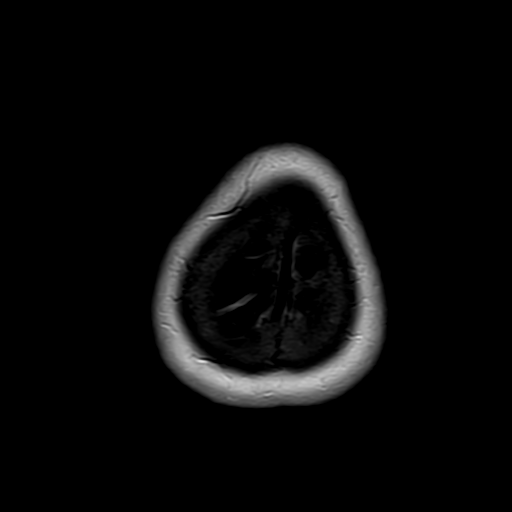

[Series 17: T1 post-contrast · coronal · 3.0mm · 0.47mm/px · 1 of 11 slices shown (3 of 3)]
[im 1/11]
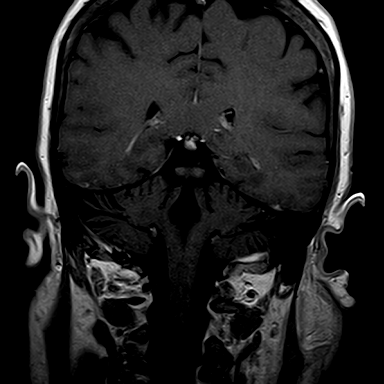

[41 of 48 positions shown; findings below may reference images not displayed]

FINDINGS: Ventricular and sulcal size is normal for the patient's age.  There is significant abnormal T2/FLAIR signal hyperintensity within the pons, medulla and cerebellum.  There is no mass effect, midline shift or intracranial hemorrhage.  There is no evidence of acute infarction. Skull base flow voids and basal cisterns are patent. Sagittal survey of midline structures is unremarkable. Following intravenous contrast administration, there is no abnormal parenchymal or leptomeningeal enhancement.  There are no extra-axial fluid collections.  Visualized paranasal sinuses, mastoid air cells and orbital contents are unremarkable.
IMPRESSION: 1. No acute intracranial abnormality.  

2. Significant abnormal T2/FLAIR signal hyperintensity within the pons, medulla and cerebellum without abnormal enhancement.  This is concerning for a neurodegenerative disorder.  Continued close clinical follow-up is recommended.

## 2022-01-04 ENCOUNTER — Other Ambulatory Visit: Payer: Self-pay

## 2022-01-06 ENCOUNTER — Encounter (INDEPENDENT_AMBULATORY_CARE_PROVIDER_SITE_OTHER): Payer: Self-pay | Admitting: Neurology

## 2022-01-06 ENCOUNTER — Other Ambulatory Visit: Payer: Self-pay

## 2022-01-06 ENCOUNTER — Ambulatory Visit: Payer: 59 | Attending: Neurology | Admitting: Neurology

## 2022-01-06 ENCOUNTER — Telehealth (INDEPENDENT_AMBULATORY_CARE_PROVIDER_SITE_OTHER): Payer: Self-pay | Admitting: Neurology

## 2022-01-06 VITALS — BP 143/88 | HR 79 | Temp 98.2°F | Ht 65.0 in | Wt 207.9 lb

## 2022-01-06 DIAGNOSIS — R41842 Visuospatial deficit: Secondary | ICD-10-CM | POA: Insufficient documentation

## 2022-01-06 DIAGNOSIS — R4189 Other symptoms and signs involving cognitive functions and awareness: Secondary | ICD-10-CM | POA: Insufficient documentation

## 2022-01-06 DIAGNOSIS — R260 Ataxic gait: Secondary | ICD-10-CM | POA: Insufficient documentation

## 2022-01-06 NOTE — Progress Notes (Deleted)
Attending Addendum:     Patient with 2 years of progressive worsening visuospatial difficulties. Although initially referred to me for ataxia, does not actually have ataxia on exam. Instead, she appears to have coordination problems due to a visuospatial processing deficit. She has good strength, no sensory deficit to pinprick or proprioception, however, her gait is antalgic with a wide base and stiff arm posture and she has significant fall anxiety. Unable to stand with feet together and eyes closed, unable to perform tandem walking. Has PBA during exam. Had MRI brain in December 2022 (on [facility]) that was overall normal without significant FLAIR changes but there was cerebellar atrophy that I appreciated in this scan. Had repeat MRI brain in June 2023 which showed significant FLAIR changes, particularly in the brainstem, cerebellum and posterior aspect of the cortex, which would explain the visuospatial processing issues. Her MOCA was 20/30, she lost points for recall, visuospatial, fluency, and orientation (thought it was the 17th but it was the 18th).

## 2022-01-06 NOTE — Telephone Encounter (Signed)
Hello:    I wanted to let you know I ran your case by my colleague and she agrees with what we had discussed in clinic: the next step would be to do a lumbar puncture/spinal tap to look at your cerebrospinal fluid (CSF), repeat an MRI brain scan, and potentially do a trial of a medication we use for autoimmune processes called IVIG. In addition, I have ordered a CT of your chest, abdomen, and pelvis to check if there is anything that could be contributing to the findings we see on your brain scan. You will be called to schedule the MRI brain, CT CAP, and neuropsych testing.     Please let me know:  1.If you would like me to place an order to have the spinal tap done   2.If you would prefer to do an elective hospital admission to coordinate the workup above and do the IVIG treatment trial     My colleague would be happy to see you in clinic, so we will get that scheduled as well. Please let me know if you have questions in the meantime.     Best,  JF

## 2022-01-06 NOTE — H&P (Signed)
Date: 01/06/2022  Name: [name]  MRN: [id number]  AGE: 51 y.o.  REFERRING PHYSICIAN: Dorinda Hill, MD  [address]  [address]  [place],  [place] [address]    PCP: Ferdinand Lango, MD    CC: Ataxic gait    History Obtained From: patient and husband    HPI: This is a 24 y.o. year old female with PMH significant for depression and restless leg syndrome who presents for evaluation of ataxia    Patient had COVID last August (2022) and afterwards started having minor falls and problems with balance and depth preception. Did not notice changes at first. Symptoms became a more evident after choking on food and falling 4 months ago. Handwriting has worsened, hand jerks make it hard for her to write. Has stopped driving due to depth perceptions Words are slurring and word finding difficulties. Cannot stand up by herself, needs support. Falls weekly, no head injuries. Has started to affect work but has been off work for the summer. Roaring in both ears, "like tinnitus constantly."  Has not effected overall hearing. Last several months, has started to drop things due to depth perception problems.No history of scoliosis, retinopathy, or neuropathy. Increase in urination, but no incontinence.    Having ankle weights on feet help her feel more "stable." Has to wear tennis shoes, will trip and fall in sandals.     Increased fatigue, will have to rest after taking showers. Has to take 1 hour rest between tasks. Past month fatigue has worsened significantly. Restless legs keep her awake, which is not new to her. Will act out dreams, punching and screaming- has had this issue for years, but has worsened last several months.     Inappropriate emotional responses started 2 year prior. Will laugh at inappropriate times and having crying spells throughout the days. Lost mother 2/1.5 years ago.     No head traumas, car accidents, or concussion.     Past Medical History: Depression     Past Medical History:   Diagnosis Date   . MDD  (major depressive disorder)    . Restless leg syndrome          Medications: sertraline (ZOLOFT) 50 mg Oral Tablet, Take 1 Tablet (50 mg total) by mouth Once a day    No facility-administered medications prior to visit.        Allergies:   Allergies   Allergen Reactions   . Penicillins Itching, Rash and Swelling       Family History:   Family Medical History:    None     Mother: heart problems   Father: heart problems  No family history neurological disease or seizures      Surgical History: Oral Surgery 12/2021        Social History:   Social History     Socioeconomic History   . Marital status: Married       Review of Systems   Constitutional- No fever   Eyes- No visual change   ENT- b/l tinnitus   Cv- no chest pain   Resp- No shortness of breath   Gi- no diarrhea   Gu- increased urinary frequency    Ms- no arthritis   Skin- no rash   Psych- emotional outburst   Heme- no nodes    Physical Exam:    BP (!) 143/88   Pulse 79   Temp 36.8 C (98.2 F)   Ht 1.651 m (5\' 5" )  Wt 94.3 kg (207 lb 14.4 oz)   SpO2 100%   BMI 34.60 kg/m         Appearance: Appropriately dressed, several crying spells throughout interview  Orientation: awake, alert, and oriented x3  Mental status:  Knowledge: appropriate  Language: no aphasia  Speech: no dysarthria  Cranial nerves:   2: No visual defect on confrontation   3,4,6: EOMI, no nystagmus   5: facial sensation intact   7: no facial asymmetry   8: hearing grossly intact   9,10: palate symmetric   11: good shoulder shrug   12: tongue midline  Gait: Unstable, wide-based gait, not able to tandem walk. Unable to stand with feet together and eyes closed  Coordination: no ataxia with infer to nose testing   Sensory: intact, symmetric to pinprick, light touch, vibration, and joint position  Muscle Tone: Normal    Muscle Exam:   Deltoid 5  Biceps 5  Triceps 5  Wrist Extension 5  Wrist Flexion 5  Interossei 5  APB 5    Iliopsoas 5  Quads 5  Hamstrings 5  Ankle dorsi flexion 5  Ankle  plantar flexion 5  Ankle eversion 5  Ankle inversion 5    Reflexes: slightly hyper reflexive     MOCA was 20/30    Outside records: See neuro referral in media tab  -12/05/2021: MRI Brain w/ IAC w/ and w/o contrast: Significant abnormal T2/FLAIR signal hyperintensity within the pons, medulla, and cerebellum without abnormal enhancement. 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      ICD-10-CM    1. Ataxic gait  R26.0         Orders Placed This Encounter   . PARANEOPLASTIC AB EVAL W/REFL TITER/LB, BASIC   . VITAMIN E, SERUM     * No order type specified *     Assessment/plan: This is a 52 y.o. year old female with PMH significant for depression and restless leg syndrome who presents for evaluation of ataxic gait that developed 1 year prior. Over the past year, patient has experienced progressive loss of depth perception and balance and increased fatigue. She had prior workup with MRI of spine and brain. MRI of brain in 11/2021 showed significant abnormal T2/FLAIR signal hyperintensity within the pons medulla and cerebellum which was not seen in previous MRIs. During exam, patient demonstrated cerebellar findings. Patient was advised that the next diagnostic step would be a lumbar puncture, but she declined at this time. Patient agreed to further MRI imaging for Memory protocol and would have more focused imaging of the cerebellum. It was discussed that patient could be admitted to the hospital voluntarily for MRI, lumbar puncture, and IVIG infusion in the case the cause is autoimmune/paraneoplastic. She did not want to be hospitalized at this time but was advised this might be the best option if symptoms worsen.    Plan  - Further MRI with Memory protocol to be performed with focus on cerebellum   -Referred for neuropysch testing  -Collect Vit. E (serum) and paraneoplastic panel   -Patient dicussed with Dr. Elesa Massed who is willing to take over her care and per Dr. Jolyn Nap advice CT of chest, abdomen, and pelvis  was ordered for paraneoplastic workup   - Elective hospitalization discussed with patient if symptoms acutely worsen. Also discussed that lumbar puncture may be neccessary in the future for further diagnosis   Percell Boston, MD, PGY-1 01/06/2022 12:57    [facility]  I saw and examined the patient.  I reviewed the resident's note.  I agree with the findings and plan of care as documented in the resident's note.  Any exceptions/additions are edited/noted.    Patient with 2 years of progressive worsening visuospatial difficulties. Although initially referred to me for ataxia, does not actually have ataxia with FNF or mirror trace on exam. Instead, she appears to have coordination problems due to a visuospatial processing deficit. She has good strength, no sensory deficit to pinprick or proprioception, however, her gait is antalgic with a wide base and stiff arm posture and she has significant fall anxiety. Unable to stand with feet together and eyes closed, unable to perform tandem walking. Has PBA during exam. Had MRI brain in December 2022 (on [facility]) that was overall normal without significant FLAIR changes but there was cerebellar atrophy that I appreciated in this scan. Had repeat MRI brain in June 2023 which showed significant FLAIR changes, particularly in the brainstem, cerebellum and posterior aspect of the cortex, which would explain the visuospatial processing issues. Her MOCA was 20/30, she lost points for recall, visuospatial, fluency, and orientation (thought it was the 17th but it was the 18th). It is still unclear at this point what is causing the FLAIR changes and cerebellar atrophy, but autoimmune and paraneoplastic processes are high on the differential.     Discussed at length with patient and husband: would highly recommend a LP to evaluate her CSF. Patient declined this as she has had difficulty even tolerating an EMG and is concerned about being able to tolerate the LP. Given  that there was such rapid progression of radiographic findings within 6 months and her symptoms have significantly worsened in the last month, would like to start with repeating MRI brain imaging with special sequences dedicated to memory. Will place referral for neuropsych testing, and obtained serum paraneoplastic panel. Will also get CT CAP to assess for occult malignancy/paraneoplastic, but lower suspicion given the longer timeline.     Offered patient elective admission to expedite workup including MRI brain w/wo contrast (memory protocol), CT CAP, LP and a trial of IVIG, but patient declined at this time. Advised if she wishes to do this to please give me a call back so that we could set this up.     On the day of the encounter, a total of 90 minutes was spent on this patient encounter, including review of historical information, examination, documentation, and post-visit activities.       Ferdinand Lango, MD  Movement Disorders  Assistant Professor of Neurology

## 2022-01-07 ENCOUNTER — Encounter (HOSPITAL_COMMUNITY): Payer: Self-pay

## 2022-01-08 ENCOUNTER — Encounter (INDEPENDENT_AMBULATORY_CARE_PROVIDER_SITE_OTHER): Payer: Self-pay

## 2022-01-08 NOTE — Progress Notes (Signed)
Patient sent MyChart message and left message by phone to contact the Neuropsychology Clinic to schedule their referred evaluation.     [facility]  [facility]   [address]  [place], [place]  [address]  Tel: [phone]

## 2022-01-16 ENCOUNTER — Encounter (INDEPENDENT_AMBULATORY_CARE_PROVIDER_SITE_OTHER): Payer: Self-pay | Admitting: Neurology

## 2022-01-16 NOTE — Nursing Note (Signed)
Lab orders faxed to [facility] at number provided per request.

## 2022-01-26 ENCOUNTER — Encounter (INDEPENDENT_AMBULATORY_CARE_PROVIDER_SITE_OTHER): Payer: Self-pay

## 2022-01-26 ENCOUNTER — Ambulatory Visit: Payer: 59 | Attending: Neurology | Admitting: Neurology

## 2022-01-26 DIAGNOSIS — R9389 Abnormal findings on diagnostic imaging of other specified body structures: Secondary | ICD-10-CM | POA: Insufficient documentation

## 2022-01-26 NOTE — H&P (Signed)
[facility]   [place] [facility] of Neurology      Operated by [facility]  Outpatient Telemedicine H&P  Date:  01/26/2022  Name: [name]  MRN: [id number]  Age:  51 y.o.  Referring Physician:No referring provider defined for this encounter.    Consult: Yes    PCP:  Ferdinand Lango, MD    CC:  No chief complaint on file.      TELEMEDICINE DOCUMENTATION:    Patient Location:  MyChart video visit from home address: [address]  [place] [address]    Patient/family aware of provider location:  yes  Patient/family consent for telemedicine:  yes  Examination observed and performed by:  Rosalyn Gess, MD      History obtained from: patient, review of medical record     SUBJECTIVE  HPI  X1 year: Walking declinig, muscles weak, falls, can get up stairs, but difficult to get back down, walking is shuffling, feels like she is barely picking her legs up. Spatial judgement/depth perception, no driving since June. Driving was making her anxious.  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  -Words slur a little bit.  -Has to hold onto bar in the shower.  -Easily fatigued.   -No night sweats, no unexplained weight loss.   -Acts out her dreams.  -Daughter has noticed her waking up screaming/jumping out of bed.    Pertinent studies:  -MRI brain: See screenshots below. Bilateral cerebellar/cerebellar peduncle/brainstem FLAIR signal changes. No enhancement.    Past Medical History  Past Medical History:   Diagnosis Date    MDD (major depressive disorder)     Restless leg syndrome        Medications  No outpatient medications have been marked as taking for the 01/26/22 encounter (Appointment) with Johnice Riebe, Shawna Orleans, MD.   Zoloft 50 mg      Allergies  Allergies   Allergen Reactions    Penicillins Itching, Rash and Swelling     Family History  Family Medical History:    None     No FH of neurologic issues.    Surgical History:  Tubal ligation      Social History  Social History     Socioeconomic History     Marital status: Married   Occupational History    Occupation: Ecologist   No smoking. Hardly any EtOH. No other drugs. 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    Review of Systems  Aside from above, no pertinent positive review of systems.    OBJECTIVE  Objective: (limited exam via tele)  General: Appears well, no distress.  Neurologic Exam:  Ophthalomscopic: Unable to evaluate over tele.  Mental status:  Level of Consciousness: Alert.  Orientations: Alert and oriented x3.  Memory, Registration, Recall, and Following of commands is normal to conversation.  AttentionsAttention and Concentration are normal.  Knowledge: Good.  Language: Normal.  Speech: Normal.  Cranial nerves: Eyes midline, face symmetric.  Muscle tone: Unable to test via tele.  Muscle exam: Moves bilateral UE against gravity.  Reflexes: Unable to test via tele.  Sensory: Unable to test via tele.  Coordination: Unable to fully assess via tele, no obvious tremor.  Gait: Did not ambulate during this encounter          IMAGING & REPORTS  Outside records: See neuro referral in media tab  -12/05/2021: MRI Brain w/ IAC w/ and w/o contrast: Significant abnormal T2/FLAIR signal hyperintensity within the pons,  medulla, and cerebellum without abnormal enhancement. 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    Independent Review of Images and Reports  MRI Brain: Dated 11/2021: Per personal review on synapse. There is significant cerebellar atrophy with diffuse hyperintensity on Flair. Review of prior MRI in 05/2021 reveals similar atrophy of the cerebellum.           ASSESSMENT & PLAN    ICD-10-CM    1. Abnormal MRI  R93.89         Clinical history and imaging definitely raise concern for an autoimmune etiology for her symptoms. Repeat MRI and neuropsych testing are scheduled for later this month. She had her paraneoplastic panel and vitamin E drawn at [facility], will request records. Discussed that pending above she may need LP and trial of IVIG.  Will arrange follow up once above completed.    Rosalyn Gess, MD  Assistant Professor, Neurology

## 2022-01-27 ENCOUNTER — Encounter (INDEPENDENT_AMBULATORY_CARE_PROVIDER_SITE_OTHER): Payer: Self-pay | Admitting: Neurology

## 2022-01-28 ENCOUNTER — Encounter (INDEPENDENT_AMBULATORY_CARE_PROVIDER_SITE_OTHER): Payer: Self-pay | Admitting: Neurology

## 2022-01-28 NOTE — Telephone Encounter (Signed)
From: [name]  To: Ferdinand Lango, MD  Sent: 01/27/2022 9:43 PM EDT  Subject: Aram Beecham ~ Thank you!    Thanks, again, for consulting with Dr Elesa Massed about my case. I really am looking forward to finding a diagnosis and hopefully a treatment plan.   Did you get the results from your order for the bloodwork?

## 2022-01-28 NOTE — Telephone Encounter (Signed)
Yes, I received the records. It looks like the vitamin E levels were in the range of normal and the paraneoplastic panel did not show any antibodies in the blood. Given your symptoms and the findings on the MRI, I still think it is reasonable to pursue a lumbar puncture (spinal tap). It looks like you have neuropsych testing and repeat MRI Brain scheduled for 8/16. Would you like to complete that first, or would you like me to place a referral to get the lumbar puncture completed? Alternatively, I can get you scheduled for an elective hospital admission in which we do the lumbar puncture and a trial of IVIG. Let me know your preference.     Best,  JF

## 2022-02-02 ENCOUNTER — Ambulatory Visit (HOSPITAL_COMMUNITY)
Admission: RE | Admit: 2022-02-02 | Discharge: 2022-02-02 | Disposition: A | Discharge status: Home / Self Care | Payer: Self-pay | Source: Ambulatory Visit

## 2022-02-02 IMAGING — CT CT ABDOMEN & PELVIS WITH CONTRAST
2 of 4 series · 15 of 46 positions shown, 17 images · IV contrast (ultravist)
Comparison: CT abdomen and pelvis dated 10/04/2021..
COMPARISON: CT abdomen and pelvis dated 10/04/2021.

------------- REPORT [id number] -------------
﻿EXAM:  CT THORAX WITH CONTRAST,CT ABDOMEN & PELVIS WITH CONTRAST
INDICATION: 51-year-old female with unsteady gait, bilateral lower extremity weakness, slurred speech.  Clinical concern for possible paraneoplastic syndrome.  Rule out occult malignancy..  Smoking history not available.
TECHNIQUE: CT was performed after oral contrast and 100 mL Ultravist 320 and reviewed in multiple projections. Radiation dose 7012 M Gy cm.. Images were reviewed in multiple windows and projections. Exam was performed using 1 or more of the following dose reduction techniques: Automated exposure control, adjustment of the mA and/or kV according to patient size, or the use of iterative reconstruction technique.
INDICATION: 51-year-old female with unsteady gait, bilateral lower extremity weakness, slurred speech.  Clinical concern for possible paraneoplastic syndrome.  Rule out occult malignancy.  Smoking history not available.
TECHNIQUE: CT was performed after oral contrast and 100 mL Ultravist 320 and reviewed in multiple projections. Radiation dose 1130 mGy cm.  Images were reviewed in multiple windows and projections. Exam was performed using 1 or more of the following dose reduction techniques: Automated exposure control, adjustment of the mA and/or kV according to patient size, or the use of iterative reconstruction technique.

[ax post · axial · 0.81mm/px · z∈[-593,-146]mm · 12 of 169 slices shown, 14 images]
[im 10/169  soft-tissue]
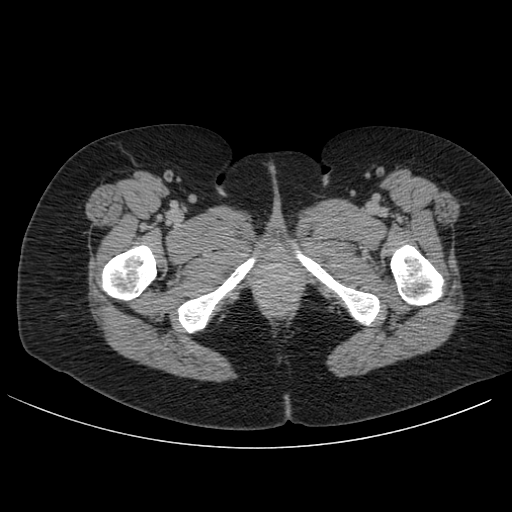
[im 10/169  bone]
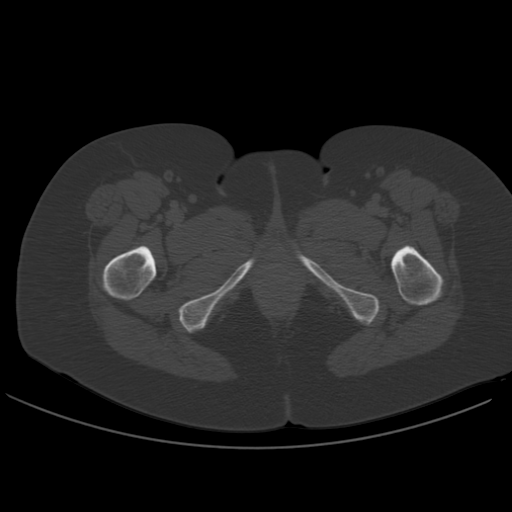
[im 29/169  soft-tissue]
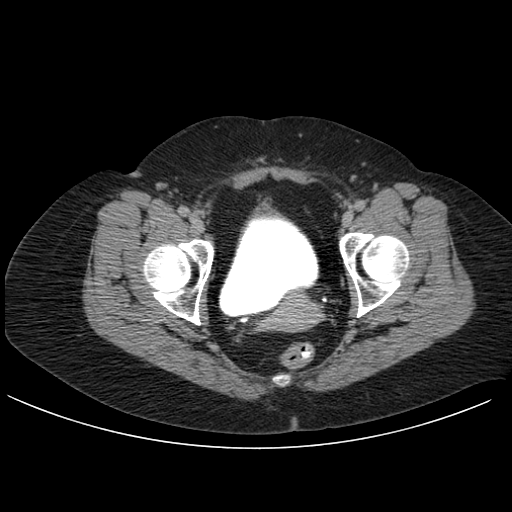
[im 38/169  soft-tissue]
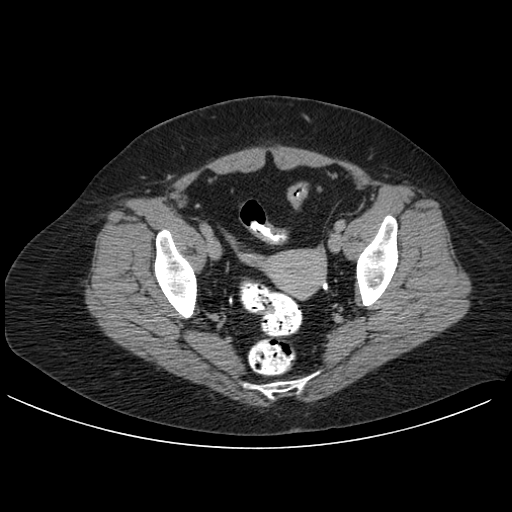
[im 47/169  soft-tissue]
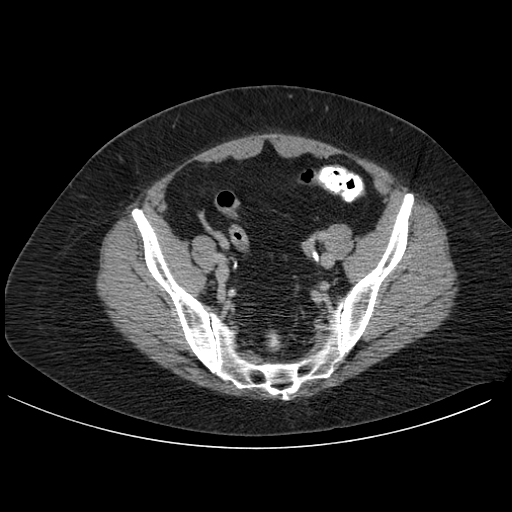
[im 66/169  soft-tissue]
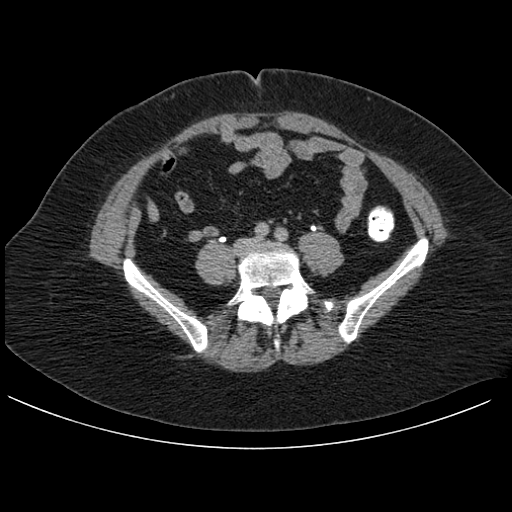
[im 75/169  soft-tissue]
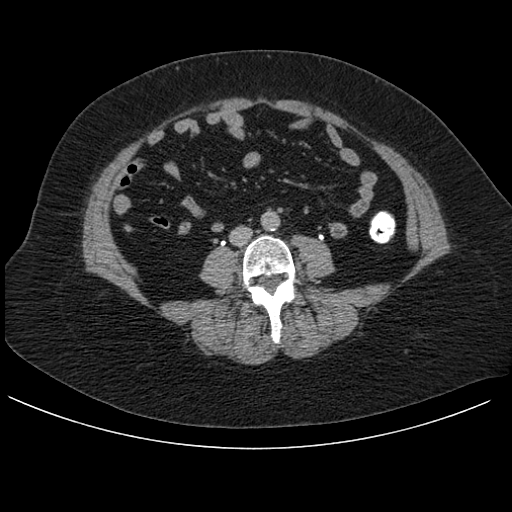
[im 94/169  soft-tissue]
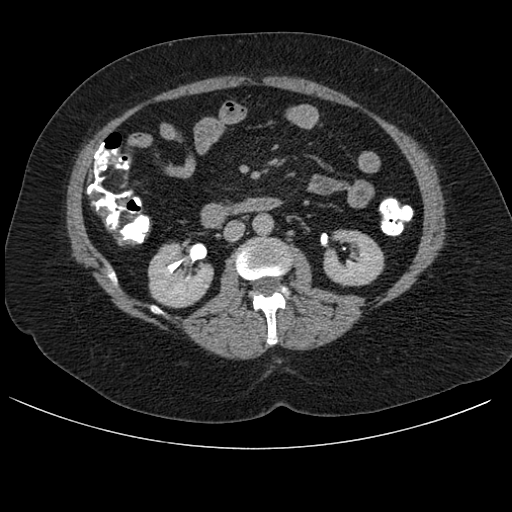
[im 103/169  soft-tissue]
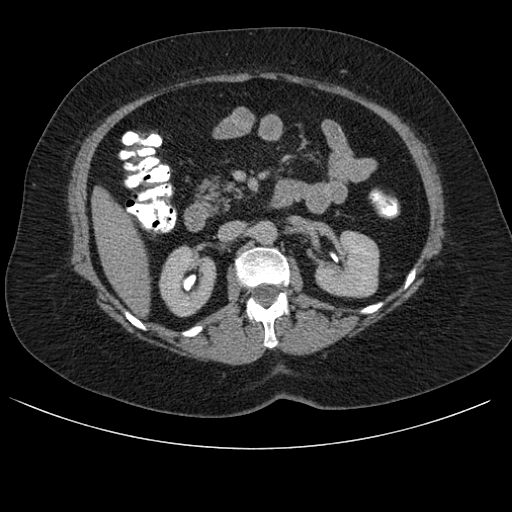
[im 122/169  soft-tissue]
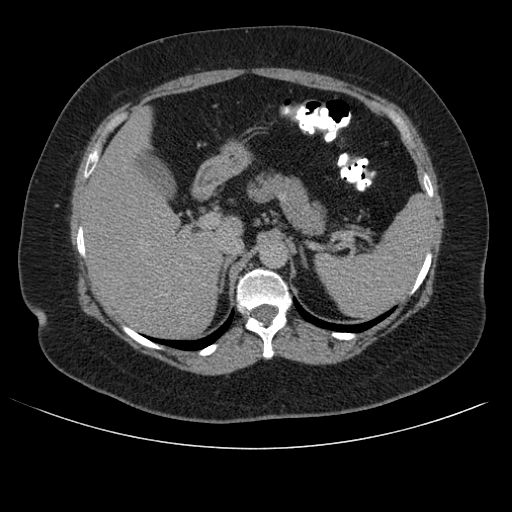
[im 122/169  bone]
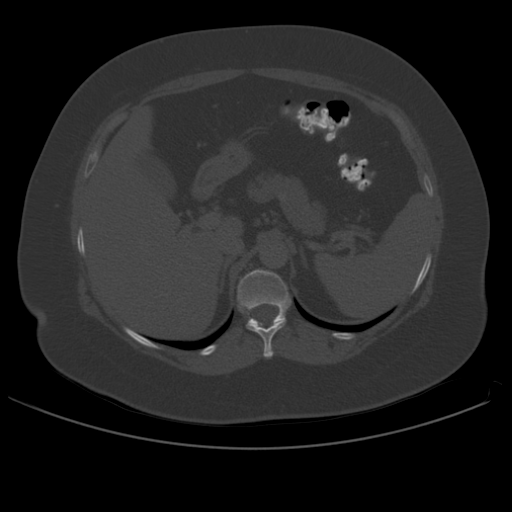
[im 131/169  soft-tissue]
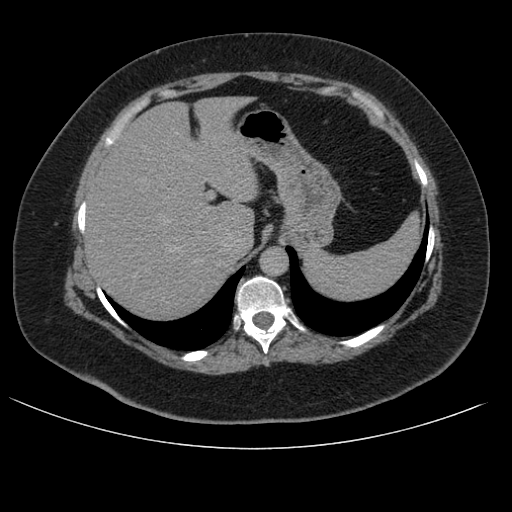
[im 141/169  soft-tissue]
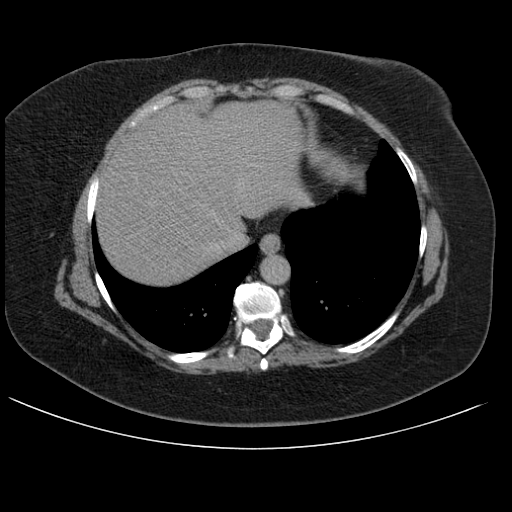
[im 159/169  soft-tissue]
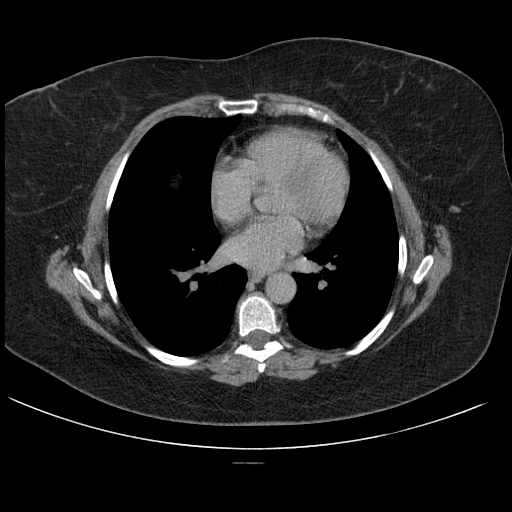

[cor · coronal · 1.16mm/px · 3 of 90 slices shown]
[im 30/90  soft-tissue]
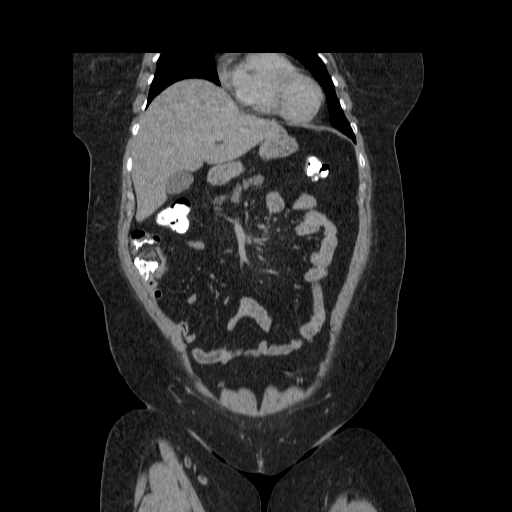
[im 40/90  soft-tissue]
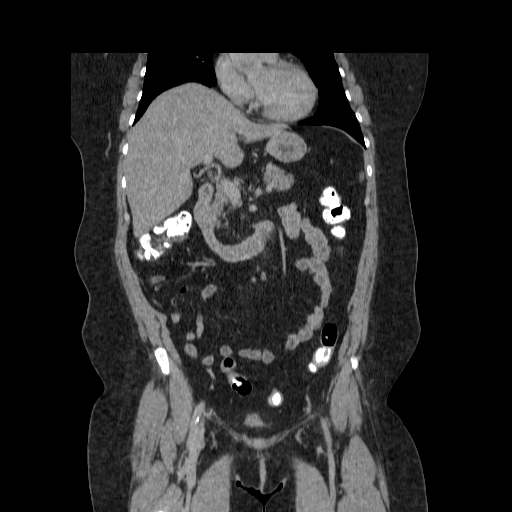
[im 50/90  soft-tissue]
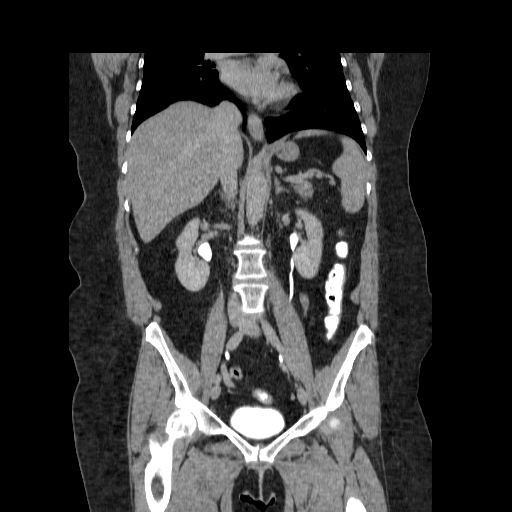

[15 of 46 positions shown; findings below may reference images not displayed]

FINDINGS: No focal lesions of supraclavicular fossa. No focal abnormalities of the chest wall and axilla are seen.  No evidence of pulmonary mass or nodule are seen.  No hilar or mediastinal adenopathy.  No effusion.  No focal abnormalities of thoracic spine sternum and ribs.

Below the diaphragm, mild hepatomegaly with fatty liver.  Normal size spleen.  Portal vein and hepatic veins are patent.  The gallbladder, bile ducts, pancreas do not show any focal abnormalities.  The adrenal glands are normal.  Kidneys do not show cystic or solid space-occupying mass or obstruction.  No retroperitoneal adenopathy or pelvic adenopathy. 

Normal postmenopausal size uterus and ovaries.  No focal bone changes of lumbar spine and pelvic bones other than significant degenerative disc disease at L5-S1 level.
IMPRESSION: 1.  No focal pulmonary lesions. No lymphadenopathy or effusion in the chest.

2. Mild hepatomegaly with fatty liver.

3. No abdominal or pelvic lymphadenopathy. No evidence of ascites.

4. No focal skeletal lesions of thoracic spine lumbosacral spine and pelvis other than degenerative disc disease at L5-S1 level.

------------- REPORT [id number] -------------
﻿EXAM:  CT THORAX WITH CONTRAST,CT ABDOMEN & PELVIS WITH CONTRAST
FINDINGS: No focal lesions of supraclavicular fossa. No focal abnormalities of the chest wall and axillae are seen.  No evidence of pulmonary mass or nodule is seen.  No hilar or mediastinal adenopathy.  No effusion.  No focal abnormalities of thoracic spine, sternum and ribs.

Below the diaphragm, mild hepatomegaly with fatty liver.  Normal size spleen.  Portal vein and hepatic veins are patent.  The gallbladder, bile ducts, pancreas do not show any focal abnormalities.  The adrenal glands are normal.  Kidneys do not show cystic or solid space-occupying mass or obstruction.  No retroperitoneal adenopathy or pelvic adenopathy. 

Normal postmenopausal size uterus and ovaries.  No focal bone changes of lumbar spine and pelvic bones other than significant degenerative disc disease at L5-S1 level.
IMPRESSION: 1. No focal pulmonary lesions. No lymphadenopathy or effusion in the chest.

2. Mild hepatomegaly with fatty liver.

3. No abdominal or pelvic lymphadenopathy. No evidence of ascites.

4. No focal skeletal lesions of thoracic spine, lumbosacral spine and pelvis other than degenerative disc disease at L5-S1 level.

## 2022-02-02 IMAGING — CT CT THORAX WITH CONTRAST
2 of 4 series · 14 of 36 positions shown, 17 images · IV contrast (CONTRAST)
Comparison: CT abdomen and pelvis dated 08/16/2021..
COMPARISON: CT abdomen and pelvis dated 08/16/2021.

------------- REPORT [id number] -------------
﻿EXAM:  CT THORAX WITH CONTRAST,CT ABDOMEN & PELVIS WITH CONTRAST
INDICATION: 51-year-old female with unsteady gait, bilateral lower extremity weakness, slurred speech.  Clinical concern for possible paraneoplastic syndrome.  Rule out occult malignancy..  Smoking history not available.
TECHNIQUE: CT was performed after oral contrast and 100 mL Ultravist 320 and reviewed in multiple projections. Radiation dose 7012 M Gy cm.. Images were reviewed in multiple windows and projections. Exam was performed using 1 or more of the following dose reduction techniques: Automated exposure control, adjustment of the mA and/or kV according to patient size, or the use of iterative reconstruction technique.
INDICATION: 51-year-old female with unsteady gait, bilateral lower extremity weakness, slurred speech.  Clinical concern for possible paraneoplastic syndrome.  Rule out occult malignancy.  Smoking history not available.
TECHNIQUE: CT was performed after oral contrast and 100 mL Ultravist 320 and reviewed in multiple projections. Radiation dose 1130 mGy cm.  Images were reviewed in multiple windows and projections. Exam was performed using 1 or more of the following dose reduction techniques: Automated exposure control, adjustment of the mA and/or kV according to patient size, or the use of iterative reconstruction technique.

[ax post · axial · 0.73mm/px · z∈[-272,+10]mm · 11 of 114 slices shown, 14 images]
[im 10/114  mediastinal]
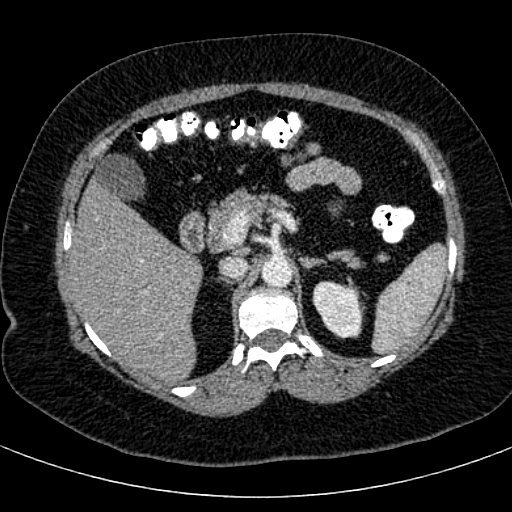
[im 10/114  lung]
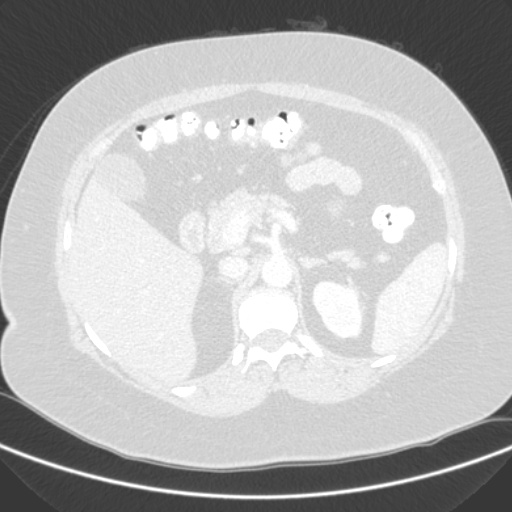
[im 19/114  lung]
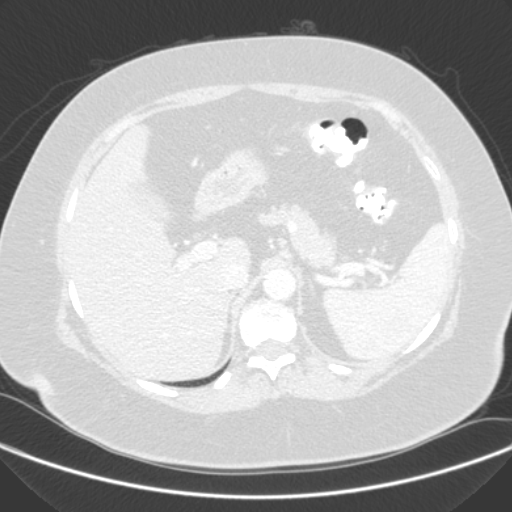
[im 29/114  lung]
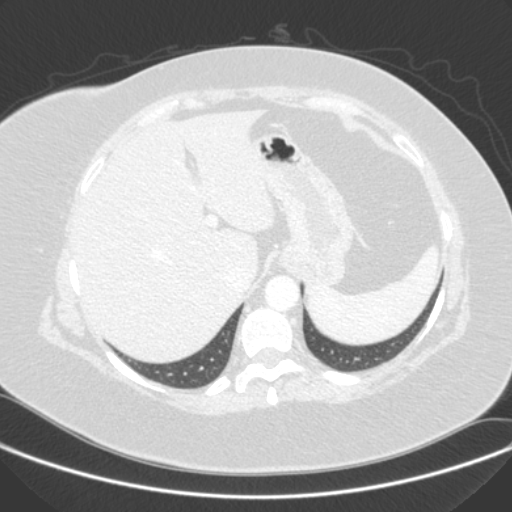
[im 38/114  lung]
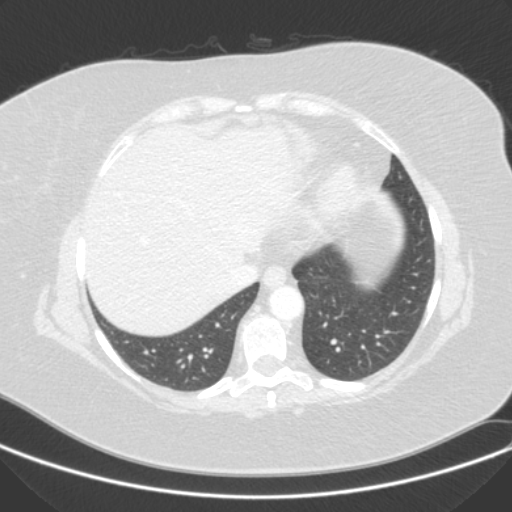
[im 48/114  mediastinal]
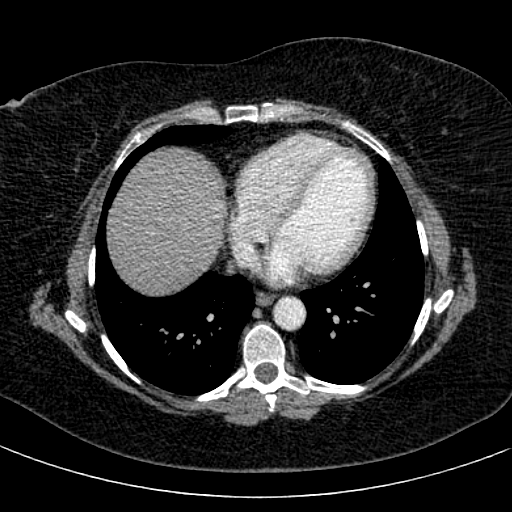
[im 48/114  lung]
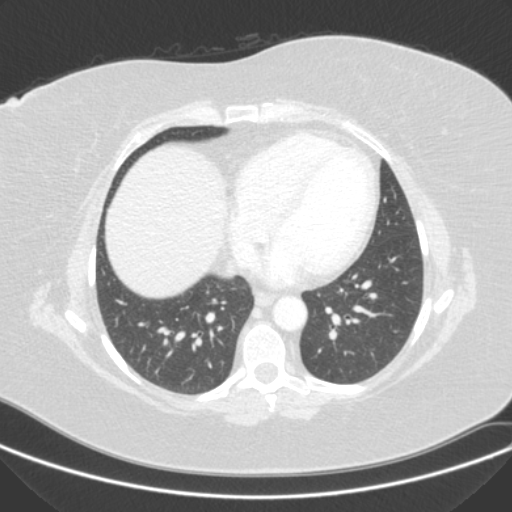
[im 57/114  lung]
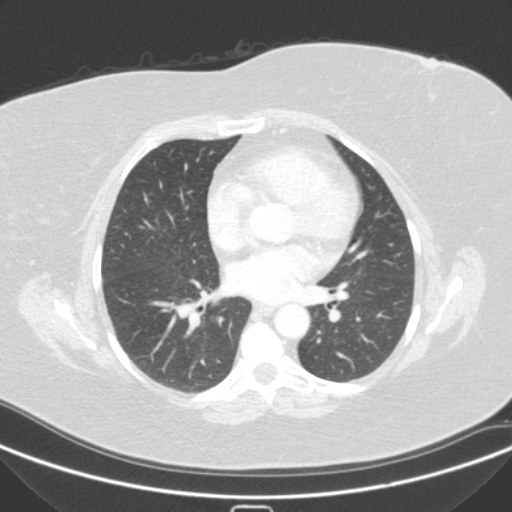
[im 66/114  lung]
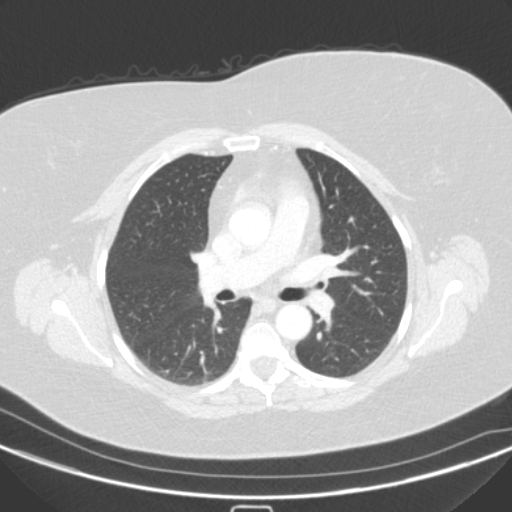
[im 76/114  lung]
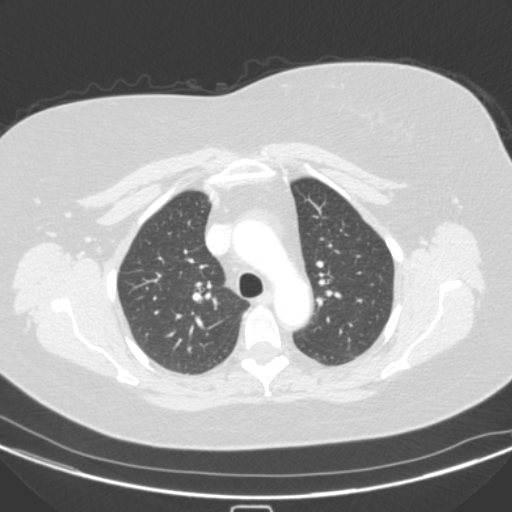
[im 85/114  mediastinal]
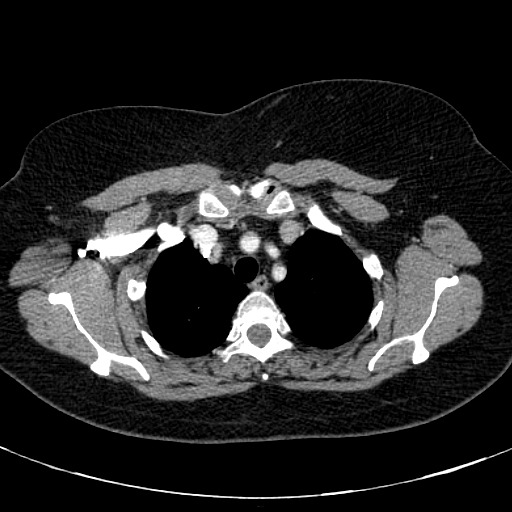
[im 85/114  lung]
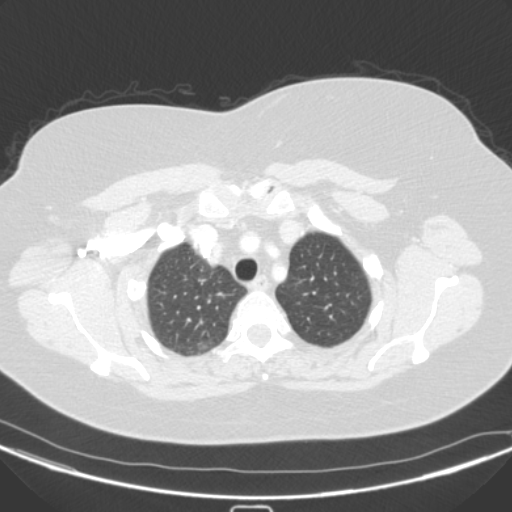
[im 95/114  lung]
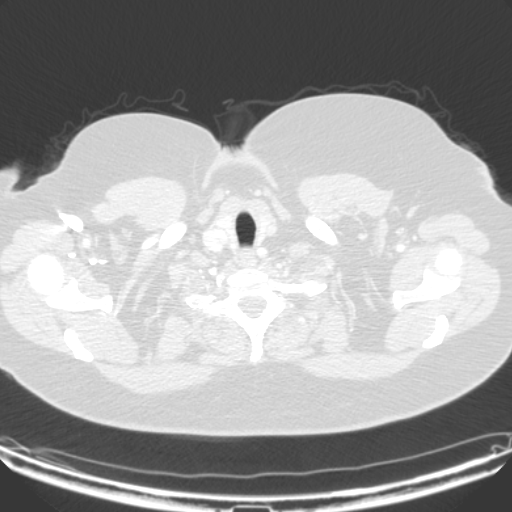
[im 104/114  lung]
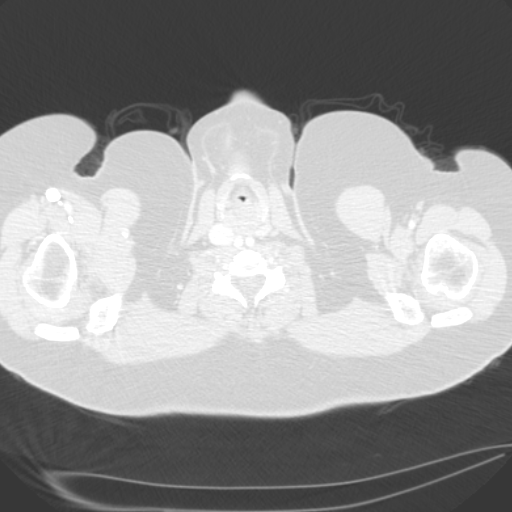

[cor · coronal · 0.89mm/px · 3 of 89 slices shown]
[im 18/89  lung]
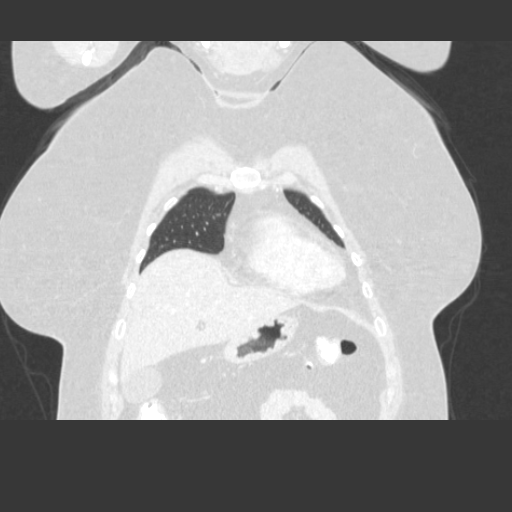
[im 36/89  lung]
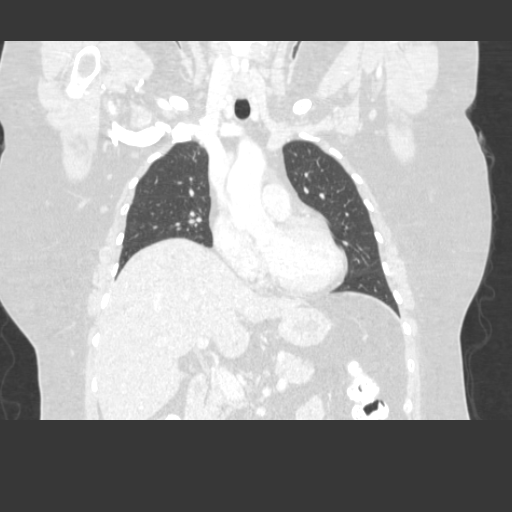
[im 53/89  lung]
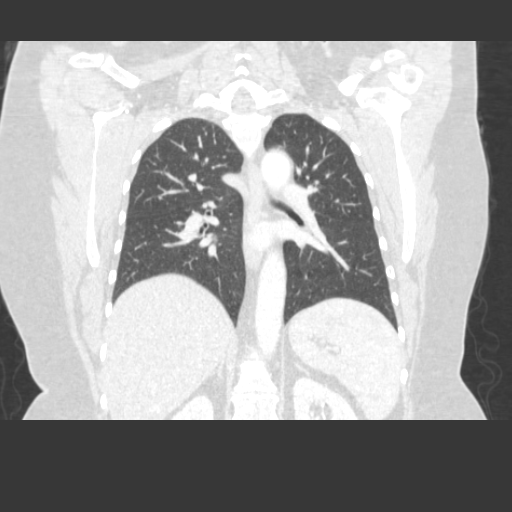

[14 of 36 positions shown; findings below may reference images not displayed]

FINDINGS: No focal lesions of supraclavicular fossa. No focal abnormalities of the chest wall and axilla are seen.  No evidence of pulmonary mass or nodule are seen.  No hilar or mediastinal adenopathy.  No effusion.  No focal abnormalities of thoracic spine sternum and ribs.

Below the diaphragm, mild hepatomegaly with fatty liver.  Normal size spleen.  Portal vein and hepatic veins are patent.  The gallbladder, bile ducts, pancreas do not show any focal abnormalities.  The adrenal glands are normal.  Kidneys do not show cystic or solid space-occupying mass or obstruction.  No retroperitoneal adenopathy or pelvic adenopathy. 

Normal postmenopausal size uterus and ovaries.  No focal bone changes of lumbar spine and pelvic bones other than significant degenerative disc disease at L5-S1 level.
IMPRESSION: 1.  No focal pulmonary lesions. No lymphadenopathy or effusion in the chest.

2. Mild hepatomegaly with fatty liver.

3. No abdominal or pelvic lymphadenopathy. No evidence of ascites.

4. No focal skeletal lesions of thoracic spine lumbosacral spine and pelvis other than degenerative disc disease at L5-S1 level.

------------- REPORT [id number] -------------
﻿EXAM:  CT THORAX WITH CONTRAST,CT ABDOMEN & PELVIS WITH CONTRAST
FINDINGS: No focal lesions of supraclavicular fossa. No focal abnormalities of the chest wall and axillae are seen.  No evidence of pulmonary mass or nodule is seen.  No hilar or mediastinal adenopathy.  No effusion.  No focal abnormalities of thoracic spine, sternum and ribs.

Below the diaphragm, mild hepatomegaly with fatty liver.  Normal size spleen.  Portal vein and hepatic veins are patent.  The gallbladder, bile ducts, pancreas do not show any focal abnormalities.  The adrenal glands are normal.  Kidneys do not show cystic or solid space-occupying mass or obstruction.  No retroperitoneal adenopathy or pelvic adenopathy. 

Normal postmenopausal size uterus and ovaries.  No focal bone changes of lumbar spine and pelvic bones other than significant degenerative disc disease at L5-S1 level.
IMPRESSION: 1. No focal pulmonary lesions. No lymphadenopathy or effusion in the chest.

2. Mild hepatomegaly with fatty liver.

3. No abdominal or pelvic lymphadenopathy. No evidence of ascites.

4. No focal skeletal lesions of thoracic spine, lumbosacral spine and pelvis other than degenerative disc disease at L5-S1 level.

## 2022-02-03 ENCOUNTER — Encounter (INDEPENDENT_AMBULATORY_CARE_PROVIDER_SITE_OTHER): Payer: Self-pay

## 2022-02-04 ENCOUNTER — Ambulatory Visit: Payer: 59 | Attending: Neurology | Admitting: Clinical Neuropsychologist

## 2022-02-04 ENCOUNTER — Inpatient Hospital Stay (INDEPENDENT_AMBULATORY_CARE_PROVIDER_SITE_OTHER)
Admission: RE | Admit: 2022-02-04 | Discharge: 2022-02-04 | Disposition: A | Discharge status: Home / Self Care | Payer: 59 | Source: Ambulatory Visit

## 2022-02-04 ENCOUNTER — Other Ambulatory Visit: Payer: Self-pay

## 2022-02-04 ENCOUNTER — Ambulatory Visit (HOSPITAL_COMMUNITY): Payer: Self-pay

## 2022-02-04 DIAGNOSIS — G3184 Mild cognitive impairment, so stated: Secondary | ICD-10-CM | POA: Insufficient documentation

## 2022-02-04 DIAGNOSIS — R41842 Visuospatial deficit: Secondary | ICD-10-CM | POA: Insufficient documentation

## 2022-02-04 DIAGNOSIS — G4752 REM sleep behavior disorder: Secondary | ICD-10-CM | POA: Insufficient documentation

## 2022-02-04 DIAGNOSIS — R419 Unspecified symptoms and signs involving cognitive functions and awareness: Secondary | ICD-10-CM

## 2022-02-04 DIAGNOSIS — R4189 Other symptoms and signs involving cognitive functions and awareness: Secondary | ICD-10-CM | POA: Insufficient documentation

## 2022-02-04 DIAGNOSIS — G2581 Restless legs syndrome: Secondary | ICD-10-CM | POA: Insufficient documentation

## 2022-02-04 MED ORDER — GADOBUTROL 1 MMOL/ML (604.72 MG/ML) INTRAVENOUS SOLUTION
9.0000 mL | INTRAVENOUS | Status: DC
Start: 2022-02-04 — End: 2022-02-04

## 2022-02-05 NOTE — Telephone Encounter (Signed)
Report from 02/02/22 CT Thorax with contrast and CT abdomen and pelvis with contrast completed at [facility] uploaded to patient's media.  Attempted to call [facility] to request images be pushed to grid or disc mailed but unable to speak to someone or leave message.

## 2022-02-06 ENCOUNTER — Encounter (INDEPENDENT_AMBULATORY_CARE_PROVIDER_SITE_OTHER): Payer: Self-pay | Admitting: Neurology

## 2022-02-13 ENCOUNTER — Telehealth (INDEPENDENT_AMBULATORY_CARE_PROVIDER_SITE_OTHER): Payer: Self-pay | Admitting: Neurology

## 2022-02-13 NOTE — Telephone Encounter (Signed)
-----   Message from Ferdinand Lango, MD sent at 02/12/2022  4:53 PM EDT -----  Regarding: RE: MRI  Contact: [phone]  I think the easiest thing to do is to admit her to the hospital and do the full workup, including MRI, LP, and a trial of IVIG. That way she wouldn't have to drive back and forth. It's up to her.   ----- Message -----  From: Carylon Perches, RN  Sent: 02/12/2022   4:16 PM EDT  To: Ferdinand Lango, MD  Subject: FW: MRI                                          Jessica,  Reading your note, I get the impression that she needs to come here for MRI. Please let me know.  Barkley Bruns   ----- Message -----  From: [name]  Sent: 02/11/2022   2:21 PM EDT  To: [facility]  Subject: MRI                                              I don't know because I've never taken Valium before. I can try it again with the Valium up there.    It is about 550 miles round trip and this will make the 3rd time  without  a definite diagnosis or treatment.  (Please understand that I'm not trying to be difficult,  but it is very hard on me to travel that distance for a repeat MRI.)

## 2022-02-13 NOTE — Telephone Encounter (Signed)
Attempted to call patient to discuss option of hospital admission to complete MRI, LP, and trial of IVIG. Left VM to call clinic.

## 2022-02-13 NOTE — Telephone Encounter (Signed)
Patient returned call. Told her that Dr Daisey Must would like to admit her to [facility] to have MRI, lumbar puncture (LP), and a trial of IVIG. Patient asks if she can discuss with her husband. Told patient this is fine. Pt to call back on Monday.

## 2022-02-16 ENCOUNTER — Telehealth (INDEPENDENT_AMBULATORY_CARE_PROVIDER_SITE_OTHER): Payer: Self-pay | Admitting: Neurology

## 2022-02-16 ENCOUNTER — Other Ambulatory Visit: Payer: Self-pay

## 2022-02-16 ENCOUNTER — Inpatient Hospital Stay (HOSPITAL_COMMUNITY): Payer: 59

## 2022-02-16 ENCOUNTER — Encounter (HOSPITAL_COMMUNITY): Payer: Self-pay

## 2022-02-16 ENCOUNTER — Inpatient Hospital Stay
Admission: EM | Admit: 2022-02-16 | Discharge: 2022-02-17 | DRG: 057 | Disposition: A | Discharge status: Home / Self Care | Payer: 59 | Attending: Neurology | Admitting: Neurology

## 2022-02-16 DIAGNOSIS — Z9181 History of falling: Secondary | ICD-10-CM

## 2022-02-16 DIAGNOSIS — Z79899 Other long term (current) drug therapy: Secondary | ICD-10-CM

## 2022-02-16 DIAGNOSIS — G319 Degenerative disease of nervous system, unspecified: Principal | ICD-10-CM

## 2022-02-16 DIAGNOSIS — F419 Anxiety disorder, unspecified: Secondary | ICD-10-CM | POA: Diagnosis present

## 2022-02-16 DIAGNOSIS — F482 Pseudobulbar affect: Secondary | ICD-10-CM

## 2022-02-16 DIAGNOSIS — Z91199 Patient's noncompliance with other medical treatment and regimen due to unspecified reason: Secondary | ICD-10-CM

## 2022-02-16 DIAGNOSIS — R413 Other amnesia: Secondary | ICD-10-CM

## 2022-02-16 DIAGNOSIS — M6281 Muscle weakness (generalized): Secondary | ICD-10-CM

## 2022-02-16 DIAGNOSIS — F329 Major depressive disorder, single episode, unspecified: Secondary | ICD-10-CM | POA: Diagnosis present

## 2022-02-16 DIAGNOSIS — Z8616 Personal history of COVID-19: Secondary | ICD-10-CM

## 2022-02-16 DIAGNOSIS — G2581 Restless legs syndrome: Secondary | ICD-10-CM | POA: Diagnosis present

## 2022-02-16 DIAGNOSIS — R2681 Unsteadiness on feet: Secondary | ICD-10-CM

## 2022-02-16 DIAGNOSIS — Z5320 Procedure and treatment not carried out because of patient's decision for unspecified reasons: Secondary | ICD-10-CM | POA: Diagnosis present

## 2022-02-16 DIAGNOSIS — G9340 Encephalopathy, unspecified: Secondary | ICD-10-CM

## 2022-02-16 MED ORDER — SERTRALINE 50 MG TABLET
50.0000 mg | ORAL_TABLET | Freq: Every day | ORAL | Status: DC
Start: 2022-02-16 — End: 2022-02-17
  Administered 2022-02-16: 50 mg via ORAL
  Filled 2022-02-16: qty 1

## 2022-02-16 MED ORDER — ONDANSETRON HCL (PF) 4 MG/2 ML INJECTION SOLUTION
4.0000 mg | Freq: Four times a day (QID) | INTRAMUSCULAR | Status: DC | PRN
Start: 2022-02-16 — End: 2022-02-17

## 2022-02-16 MED ORDER — ACETAMINOPHEN 325 MG TABLET
650.0000 mg | ORAL_TABLET | ORAL | Status: DC | PRN
Start: 2022-02-16 — End: 2022-02-17

## 2022-02-16 MED ORDER — GADOBUTROL 1 MMOL/ML (604.72 MG/ML) INTRAVENOUS SOLUTION
9.5000 mL | INTRAVENOUS | Status: AC
Start: 2022-02-16 — End: 2022-02-16
  Administered 2022-02-16: 9.5 mL via INTRAVENOUS

## 2022-02-16 MED ORDER — DIAZEPAM 5 MG/ML INJECTION SYRINGE
INJECTION | INTRAMUSCULAR | Status: AC
Start: 2022-02-16 — End: 2022-02-16
  Filled 2022-02-16: qty 2

## 2022-02-16 MED ORDER — ENOXAPARIN 40 MG/0.4 ML SUBCUTANEOUS SYRINGE
40.0000 mg | INJECTION | SUBCUTANEOUS | Status: DC
Start: 2022-02-16 — End: 2022-02-17
  Administered 2022-02-16: 40 mg via SUBCUTANEOUS
  Filled 2022-02-16: qty 0.4

## 2022-02-16 MED ORDER — DIAZEPAM 5 MG/ML INJECTION SYRINGE
5.0000 mg | INJECTION | INTRAMUSCULAR | Status: DC
Start: 2022-02-16 — End: 2022-02-17
  Administered 2022-02-16: 5 mg via INTRAVENOUS
  Filled 2022-02-16: qty 2

## 2022-02-16 NOTE — ED Nurses Note (Signed)
Pt here for further work up for autoimmune per neurology.

## 2022-02-16 NOTE — ED Nurses Note (Signed)
Report called to [address] RN. No questions or concerns at this time.

## 2022-02-16 NOTE — Nurses Notes (Signed)
Pt arrived to 1023 via stretcher. Walked to bed with assistance of transport.

## 2022-02-16 NOTE — Telephone Encounter (Signed)
Call received from patient's husband Dorinda Hill who says they are at the ED so that patient can have testing that Dr Daisey Must wants done. Doreen Salvage that Dr Daisey Must intended to schedule patient for direct admit and that Dr Daisey Must is not here this week. Will call ED and call husband back to confirm patient should stay in ED.  Called and spoke to Dr Joya San, neurology resident on call. Dr Sara Chu checked and called back to confirm patient will be admitted for MRI, LP, and IVIG trial.  Called patient's husband back to update regarding above.

## 2022-02-16 NOTE — Nurses Notes (Signed)
MRI called and asked for pre-meds to be ordered. Service paged and called back, orders for valium placed for MRI.

## 2022-02-16 NOTE — H&P (Signed)
[facility]   Neurology H&P     [name], [name], 51 y.o. female  Date of Admission:  02/16/2022  Date of Birth:  [birth year]    PCP: Lindsey Grout, MD    Information obtained from: patient and history reviewed via medical record  Chief Complaint:  Encephalopathy    HPI:   This is a 51 y.o. year old female with PMH significant for depression and restless leg syndrome who presents as a direct admit from [facility] for MRI and LP for further evaluation of unsteady gait.     Main concerns upon presenting to [facility] ED  - Loss of balance when walking - Started about 1.5 years ago. Reports unsteadiness with walking with a little wide based gait. Reports associated 2 minor falls with no head injury this year. States for both falls, she felt dizzy with spinning sensation after standing up and tripped, falling forward. Reports it worse especially when taking a shower, needs to hold on to bar.  Spinning sensation when gets up to walk, with associated tinnitus lasts for couple of minutes    - Coordination - Started about 1 year ago. Reports issues with depth perception (feels things are closer to her than it really is) and as a result stopped driving in June 6754. Has history of acting out in dreams but reports its been worse this past year.    - Issues with cognitive especially short term memory loss issues. Reports onset around May 2023. MOCA 20/30 on 01/06/22.    Denies any associated bowel/bladder incontinence, weakness of extremities, numbness, or vision changes/blurry vision. Also denies any recent infection but reports having COVID 1 year ago. Denies any weight loss.      Recent Neurology Encounters  01/26/22 telemedicine encounter with Dr Elesa Massed  X1 year: Walking declining, muscles weak, falls, can get up stairs, but difficult to get back down, walking is shuffling, feels like she is barely picking her legs up. Spatial judgement/depth perception, no driving since June. Driving was making her anxious.  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  -Words slur a little bit.  -Has to hold onto bar in the shower.  -Easily fatigued.   -No night sweats, no unexplained weight loss.   -Acts out her dreams.  -Daughter has noticed her waking up screaming/jumping out of bed.  Pertinent studies:  -MRI brain: See screenshots below. Bilateral cerebellar/cerebellar peduncle/brainstem FLAIR signal changes. No enhancement.  Clinical history and imaging definitely raise concern for an autoimmune etiology for her symptoms. Repeat MRI and neuropsych testing are scheduled for later this month. She had her paraneoplastic panel and vitamin E drawn at [facility], will request records. Discussed that pending above she may need LP and trial of IVIG. Will arrange follow up once above completed.     01/06/22 office visit with Dr Daisey Must  Patient with 2 years of progressive worsening visuospatial difficulties. Although initially referred to me for ataxia, does not actually have ataxia with FNF or mirror trace on exam. Instead, she appears to have coordination problems due to a visuospatial processing deficit. She has good strength, no sensory deficit to pinprick or proprioception, however, her gait is antalgic with a wide base and stiff arm posture and she has significant fall anxiety. Unable to stand with feet together and eyes closed, unable to perform tandem walking. Has PBA during exam. Had MRI brain in December 2022 (on Synapse) that was overall normal without significant  FLAIR changes but there was cerebellar atrophy that I appreciated in this scan. Had repeat MRI brain in June 2023 which showed significant FLAIR changes, particularly in the brainstem, cerebellum and posterior aspect of the cortex, which would explain the visuospatial processing issues. Her MOCA was 20/30, she lost points for recall, visuospatial, fluency, and orientation (thought it was the 17th but it was the 18th). It is still unclear at this point  what is causing the FLAIR changes and cerebellar atrophy, but autoimmune and paraneoplastic processes are high on the differential.   Discussed at length with patient and husband: would highly recommend a LP to evaluate her CSF. Patient declined this as she has had difficulty even tolerating an EMG and is concerned about being able to tolerate the LP. Given that there was such rapid progression of radiographic findings within 6 months and her symptoms have significantly worsened in the last month, would like to start with repeating MRI brain imaging with special sequences dedicated to memory. Will place referral for neuropsych testing, and obtained serum paraneoplastic panel. Will also get CT CAP to assess for occult malignancy/paraneoplastic, but lower suspicion given the longer timeline.      Offered patient elective admission to expedite workup including MRI brain w/wo contrast (memory protocol), CT CAP, LP and a trial of IVIG, but patient declined at this time. Advised if she wishes to do this to please give me a call back so that we could set this up.   MOCA was 20/30 during that encounter.    Admission Source:  Clinic referral  Advance Directives:  None-Discussed  Hospice involvement prior to admission?  Not applicable    Location (of pain): Quality (character of pain) Severity (minimal, mild, severe, scale or 1-10) Duration (how long has pain/sx present) Timing (when does pain/sx occur)  Context (activity at/before onset) Modifying Factors (what makes pain/sx  Better/worse) Associate Sign/Sx (what accompanies main pain/sx)    Past Medical History:   Diagnosis Date    MDD (major depressive disorder)     Restless leg syndrome        Medications Prior to Admission       Prescriptions    sertraline (ZOLOFT) 50 mg Oral Tablet    Take 1 Tablet (50 mg total) by mouth Once a day          Allergies   Allergen Reactions    Penicillins Itching, Rash and Swelling     Social History     Tobacco Use    Smoking status: Not  on file    Smokeless tobacco: Not on file   Substance Use Topics    Alcohol use: Not on file     Past Family History: None    ROS: Other than ROS in the HPI, all other systems were negative.    Exam:  Temperature: 36.2 C (97.2 F)  Heart Rate: 80  BP (Non-Invasive): 120/81  Respiratory Rate: 18  SpO2: 98 %  Appearance: Normal  Orientation: awake, alert, and oriented x3  Mental status:  Knowledge: appropriate  Language: no aphasia  Speech: no dysarthria  Cranial nerves:                2: No visual defect on confrontation                3,4,6: EOMI, no nystagmus                5: facial sensation intact  7: no facial asymmetry                8: hearing grossly intact                9,10: palate symmetric                11: good shoulder shrug                12: tongue midline    Gait, Coordination, and Reflexes:   Gait: Truncal Ataxia present; Unstable, wide-based gait, not able to tandem walk or walk on heels/toes. No symmetry of balance when standing/ Unable to stand with feet together and eyes closed  Coordination: No Limb Ataxia: Finger to nose: Normal and Heel to shin: Normal    Sensory: intact, symmetric to pinprick, light touch, vibration, and joint position  Muscle tone: WNL, No rigidity  Muscle exam  Arm Right Left Leg Right Left   Deltoid 5/5 5/5 Iliopsoas 5/5 5/5   Biceps 5/5 5/5 Quads 5/5 5/5   Triceps 5/5 5/5 Hamstrings 5/5 5/5   Wrist Extension 5/5 5/5 Ankle Dorsi Flexion 5/5 5/5   Wrist Flexion 5/5 5/5 Ankle Plantar Flexion 5/5 5/5   Interossei 5/5 5/5 Ankle Eversion 5/5 5/5   APB 5/5 5/5 Ankle Inversion 5/5 5/5     Reflexes   RJ BJ TJ KJ AJ Plantars Hoffman's   Right 2+ 2+ 2+ 2+ 2+ Downgoing Not present   Left 2+ 2+ 2+ 2+ 2+ Downgoing Not present       Labs:    I have reviewed all lab results.    Review of reports and notes reveal:   MRI brain in June 2023 which showed significant FLAIR changes, particularly in the brainstem, cerebellum and posterior aspect of the  cortex,      Assessment/Plan:  There are no active hospital problems to display for this patient.  This is a 51 y.o. year old female with PMH significant for depression and restless leg syndrome who presents as a direct admit from [facility] for MRI and LP for further evaluation of worsening unsteady gait for over a year, visuospatial processing deficit and recent short term memory loss. MRI brain in June 2023 showed significant FLAIR changes, particularly in the brainstem, cerebellum and posterior aspect of the cortex. Admitted to Neurology for further workup.        Worsening Visio-spatial Difficulties and Unsteady Gait  Clinical history (progressive worsening visuospatial difficulties, her gait is antalgic with a wide base and stiff arm posture and she has significant fall anxiety) and imaging (MRI brain in June 2023 which showed significant FLAIR changes and cerebellar atrophy, particularly in the brainstem, cerebellum and posterior aspect of the cortex, which would explain the visuospatial processing issues) raise concern for an autoimmune vs paraneoplastic processes      - Repeat MRI ordered; pending  - CT C/A/P ordered; pending   - Neuropsych testing done.   - Plan for LP         - Cell count, glucose, protein, gram stain with culture, Fungal culture, VDRL, HSV1/HSV2 PCR, Lyme AB, CSF ACE, VZV, CMV, enterovirus, Myelin basic protein, cryptococcal antigen, Flow cytometry, CSF cytopathology,           - Paraneoplastic panel, CSF oligoclonal bands levels.           - Will obtain opening pressure and save remaining CSF at -70 degrees.    - May consider Autoimmune Encephalopathy work-up if  other work up negative and no improvement:          ANNA-1 (hu), ANNA-2 (Ri), ANNA-3, PCA-2, Amphiphysin IgG, CRMP-5 IgG, Ma1, Ma2, GAD65 IgG, AGNA (SOX1), VGKC, NMDA, GABA-A, GABA-B, AMPA, mGluR5, Glycine receptor, IgLON5   - May need trial of IVIG depending on CSF analysis/results.         DNR Status this admission:  Full  Code  Palliative/Supportive Care consulted?  no  Hospice Consulted?  Not applicable    Current Comorbid Conditions - Neurology H&P  Coma (GCS less than 8): Coma -Not applicable  TIA not applicable    DVT/PE Prophylaxis: Enoxaparin    Elissa Lovett, MD      I saw and examined the patient.  I reviewed the resident's note.  I agree with the findings and plan of care as documented in the resident's note.  Any exceptions/additions are edited/noted.    Georgina Peer, MD

## 2022-02-16 NOTE — ED Provider Notes (Signed)
[name] - Emergency Department  ED Primary Provider Note  History of Present Illness   Chief Complaint   Patient presents with    Neurologic Problem     pt states she is to be admitted to neurology for an mri and spinal tap. she was instructed to come to er     [name] is a 51 y.o. female who had concerns including Neurologic Problem.  Arrival: The patient arrived by Car    Patient presents to the Emergency Department with neurologic problem. Patient reports following with Dr. Daisey Must in neurology who reportedly advised her to come to [facility] ED for admission to neuro service for an MRI, LP, and trial of IVIG infusion. Per spouse, patient follows with neurology d/t an ongoing but undiagnosed encephalopathy issue. Patient endorses symptoms of weakness, unsteady gait, and confusion for the 1.5 years that have been progressively worsening. She indicates her symptoms have eliminated her ability to work and drive. Per patient, she had a brain MRI in Dec 2022 and June 2023 with changes shown in that time frame.  Per charting, no additional pertinent PMH or PSH.  View charting for a full list of medications which does not include anticoagulants.       History provided by:  Patient and spouse    Review of Systems   Pertinent positive and negative ROS as per HPI.  Historical Data   History Reviewed This Encounter: Medical History  Surgical History  Family History  Social History    Physical Exam   ED Triage Vitals [02/16/22 0933]   BP (Non-Invasive) 120/81   Heart Rate 80   Respiratory Rate 18   Temperature 36.2 C (97.2 F)   SpO2 98 %   Weight 98.1 kg (216 lb 4.3 oz)   Height 1.651 m (5\' 5" )     Physical Exam  Vitals and nursing note reviewed.   Constitutional:       General: She is not in acute distress.     Appearance: Normal appearance. She is not ill-appearing, toxic-appearing or diaphoretic.   Cardiovascular:      Rate and Rhythm: Normal rate.   Pulmonary:      Effort: Pulmonary effort is  normal.   Musculoskeletal:      Left lower leg: No edema.   Neurological:      General: No focal deficit present.      Mental Status: She is alert and oriented to person, place, and time.   Psychiatric:         Mood and Affect: Mood normal.         Behavior: Behavior normal.     Patient Data   Labs Ordered/Reviewed - No data to display  No orders to display     Medical Decision Making        Medical Decision Making  Amount and/or Complexity of Data Reviewed  Labs: ordered.  Radiology: ordered.    Risk  Parenteral controlled substances.  Decision regarding hospitalization.    Patient is a 51 year old female with a history of encephalopathy who presents for neurology admission for MRI, LP, IVIG infusion.  Patient denies any new symptoms at this time.  On presentation, hemodynamically stable, afebrile.    Admitted to the neurology service.         Medications Administered in the ED   sertraline (ZOLOFT) tablet (has no administration in time range)   enoxaparin PF (LOVENOX) 40 mg/0.4 mL SubQ injection (has no administration in  time range)   acetaminophen (TYLENOL) tablet (has no administration in time range)   ondansetron (ZOFRAN) 2 mg/mL injection (has no administration in time range)     Clinical Impression   Encephalopathy (Primary)       Disposition: Admitted       I am scribing for, and in the presence of, Parke Poisson, MD, for services provided on 02/16/2022.  Gust Rung, SCRIBE   Gust Rung, SCRIBE  02/16/2022, 09:44    I personally performed the services described in this documentation, as scribed  in my presence, and it is both accurate  and complete.    Parke Poisson, MD

## 2022-02-16 NOTE — Neuropsychology Assessment (Addendum)
NEUROPSYCHOLOGICAL EVALUATION     PATIENT NAME: [name] NUMBER: [id number]  DATE OF SERVICE: 02/04/2022  DATE OF BIRTH: [birth year]  Time in:  0840                 Time out:  1215     CPT/CODE:  82993 = 1 unit - Clinical Interview (Interview 0840/0930)  [phone] = 0 unit - Test Administration/Data Gathering (30-minute code - HS - 0940/1215 + 120 minutes scoring)  96139 = 0 units- Add on units of Test Administration/Data Gathering (30-minute code - Technician)  [phone] = 1 unit - Neuropsychology Testing Evaluation Physicist, medical) Services (Medical Record Review 25 minutes, Report writing 90 minutes)  96133 = 1 units - Add on Neuropsychology Testing Evaluation Physicist, medical) Services (60-minute increments)     REFERRAL INFORMATION:  [name] is a 51 y.o. right-handed, white, cisgender, woman, with 16 years of education, referred by [facility] for an evaluation of their cognitive and behavioral function in the context of ataxic gait with a past medical history of Restless Leg Syndrome and depression. Medical records did not include a previous neuropsychological evaluation.     Collateral Sources of Information:              Don - Husband     PRESENTING CONCERNS:  COGNITIVE HISTORY:  Memory:  She denies significant memory changes.  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  She feels like her thinking is not as clear. She began noticing these changes in December 2022. Currently, she resigned from work.  Speech/Language:  Word slurring, more difficulty with pronunciation.  Word-finding difficulties began June 2023. She did not notice it during the school year prior to the summer.  Executive functioning:  Since December 2022, her planning and organizing abilities have progressively worsened.  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 She feels more overwhelmed in those moments as well and described school as stress.  Visuospatial:  Problems with depth perception. She had an incident of trying to place the microwave dish back in the microwave and misjudging the distance, shattering the dish onto the ground. She often misjudges distances.  Driving is terrible. Last drove in June 2023.  Typical drive to work was 45 minutes, but it was taking her 1.5 hours in June because she was very cautious while driving. She would often pull over to let people around her. She was also driving down the middle of the road because she felt she would go off the road to either the left or the right.  Began to feel unsafe while driving. Passing someone, she felt like she was going to hit them, or they were going to hit her.  Parking, she is sideways and 5 feet back from where she should be parked.  Processing Speed:  She still thinks quickly. Single tasks are not a problem cognitively.     EMOTIONAL HISTORY:  Depression:  History of depression.  Significant stressors in the past two years. As of 01/20/2022, had to resign from work due to physical changes that began earlier in the year.  In 2022, her mother passed away due to lung cancer. They lived across the road from each other, were very close. Tearful when discussing stressors.  Anxiety:  She was always a Product/process development scientist.  Currently feels unsafe due to physical changes. For the last month and a half, won't go out of the house without her husband.  She is overwhelmed easily.  Irritability/agitation:  Began to notice increase in irritability in December 2022. Since she resigned from work in August of this year, aggravation levels are better. She still feels quick to  get aggravated. In the past, she had a lot of patience.             Impulse Control: Denied.  Psychosis/mania: Denied.  Abuse: Denied  Suicidal/Homicidal ideation:  When questioned directly, the patient denied current or historical suicidal or homicidal ideation, plan, or intent.  Psychiatric Hospitalizations: Denied  Talk Therapy: Denied  Sleep:  Always had sleep problems. Struggles with restless leg syndrome.  The last time sleep was relatively normal was many years. Never been normal in 30 years.  Before school ended, she began sleeping constantly and had prominent fatigue. She was basically only awake to go to school and would nap directly after getting home. Her sleep schedule became altered and she began only sleeping from 4 AM - 12 PM. This reduced over time and she estimates obtaining 2-3 hours of sleep nightly.  Acting out her dreams nightly. Talking, screaming, yelling in her sleep. She has no memory of these sleep disturbances. They happen every night. No longer sleeps in the same room as her husband due to constant interruptions.  Appetite:  Described appetite as "good". Denied changes.  Pain:  The patient denied debilitating levels of pain.     PHYSICAL HISTORY  Physical:  Since physical changes began, more easily tearful. The crying is really when she is talking about the changes. She is feeling sad as it happens.  Gait/balance:  Medical Record Review from Neurology (01/06/2022): "Patient with 2 years of progressive worsening visuospatial difficulties. Although initially referred to me for ataxia, does not actually have ataxia with FNF or mirror trace on exam. Instead, she appears to have coordination problems due to a visuospatial processing deficit. She has good strength, no sensory deficit to pinprick or proprioception, however, her gait is antalgic with a wide base and stiff arm posture and she has significant fall anxiety. Unable to stand with feet together and eyes closed, unable to perform  tandem walking. Has PBA during exam."  Noticed balance problems approximately 1 year ago, in December 2022 after she had COVID-19. She noticed that she became ill and a week later she began having dizziness and balance problems.  She can go up steps but not down steps. Her legs feel very heavy. These are steps she was previously great at completing.  This gait change has physically worsened across time. She now holds onto her husband during ambulation.  Falls with bruises but no head injuries.  When she chews, her jaw muscles get tired and she is unable to finish eating. She has to take breaks while eating in order to finish a meal.  Her shoulders are weak. She can't do her entire shower routine because she is unable to hold her hands above her head. She will take breaks in order to finish her routine (e.g., between washing her hair and brushing her hair).  She can't wash her hair without feeling like she is going to fall backwards, worse with eyes  closed.  Tremor/stiffness/rigidity:  No tremors. No rigidity.  Incontinence: Denied.  Vision/Hearing:  Wears contacts. Prescriptions is up to date.  Optometrist did not note any problems or issues.  Tinnitus. All the time, constant in ears. Becomes frustrating in a really quiet room as it is quite loud.  Taste/Smell: Denied changes.     ADLS: Functional Description: IADLS: Functional Description:   Bathing Independent Shopping Only orders deliveries, does not go out to shop.   Selecting Appropriate Attire Independent Basic Housework/Maintenance Independent, takes her all day. Sweeping, mopping, vacuum she feels like she will fall. Cannot clean up high or down low..   Putting On Clothes Gets really fatigued. She has to dress then take a rest to do her hair. Laundry Independent, it is on the middle floor.   Feeding When she is eating, her jaw gets tired and she has to take a rest. Then come back and finish chewing. Using Communication Devices Independent   Grooming, Trouble  brushing her hair as her shoulders get weak. Managing Transportation/Driving D/C in June due to VS difficulties.   Oral Hygiene Independent Cooking/Preparing Meals Independent   Toileting/Maintaining Continence #1: Independent  #2: Independent Managing Medications Independent   Walking/Transferring Holds onto her husband. Cannot really go out without him. Managing Finances Independent   Climbing Stairs Trouble with stairs. For the last year she has only been living on the middle floor of their three story home due to inability to do stairs.        MEDICAL HISTORY:         The patient denied a history of birth complications, difficulty achieving their developmental milestones or childhood illnesses/surgeries.         The patient denied a history of ischemic/hemorrhagic stroke, traumatic brain injury, or seizures.     Past Medical History:   Diagnosis Date    MDD (major depressive disorder)      Restless leg syndrome        Surgical history:  1.     Tubal ligation 19 years ago.  2.     Tummy tuck at age 64.  3.     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     Family medical history included:         MI - father         Lung Cancer - mother         No dementia.     NEUROIMAGING:  Brain MRI w&w/o Contrast (05/29/2021): "Unremarkable Exam"     MRI Cervical Spine w/o Contrast (09/29/2021): "Mild degenerative changes at C6-7"     MRI Brain w&w/o Contrast (12/05/2021): "Significant abnormal T2FLAIR signal hyperintensity within the pons, medulla, and cerebellum without abnormal enhancement. 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 Continued close clinical follow-up is recommended."    Medical Record Review from Neurology (01/06/2022): "Had MRI brain in December 2022 (on Synapse) that was overall normal without significant FLAIR changes but there was cerebellar atrophy that I appreciated in this scan. Had repeat MRI brain in June 2023 which showed significant FLAIR changes, particularly in the  brainstem, cerebellum and posterior aspect of the cortex, which would explain the visuospatial processing issues. Her MOCA was 20/30, she lost points for recall, visuospatial, fluency, and orientation (thought it was the 17th but it was the 18th). It is still unclear at this point what is causing the FLAIR changes and cerebellar atrophy, but autoimmune and paraneoplastic processes are high on the differential.  Discussed at length with patient and husband: would highly recommend a LP to evaluate her CSF. Patient declined this as she has had difficulty even tolerating an EMG and is concerned about being able to tolerate the LP. Given that there was such rapid progression of radiographic findings within 6 months and her symptoms have significantly worsened in the last month, would like to start with repeating MRI brain imaging with special sequences dedicated to memory. Will place referral for neuropsych testing, and obtained serum paraneoplastic panel. Will also get CT CAP to assess for occult malignancy/paraneoplastic, but lower suspicion given the longer timeline. Offered patient elective admission to expedite workup including MRI brain w/wo contrast (memory protocol), CT CAP, LP and a trial of IVIG, but patient declined at this time. Advised if she wishes to do this to please give me a call back so that we could set this up."     SUBSTANCE USE HISTORY:  Per patient:  Alcohol: Denied  Cannabis: Denied  Cocaine/Crack: Denied  Opiates/Opioids: Denied  Amphetamines: Denied  Barbiturates: Denied  Hallucinogens: Denied  IV Drug Use: Denied  Prescription Medication Overuse: Denied  Tobacco Products: Denied     MEDICATIONS:  Current Outpatient Medications   Medication Sig    sertraline (ZOLOFT) 50 mg Oral Tablet Take 1 Tablet (50 mg total) by mouth Once a day           SOCIAL HISTORY:  Per patient:  Education:  16 years of formal education (standard high school diploma, [employer]).  Denied a history of learning  difficulties, attention problems, or behavioral problems.  Military service:  Denied.  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  Relationships:  Married to husband for 30 years  4 children, 1 step-daughter and 3 biological children.  19, 21, 28, 35 ages and live locally. Good relationships with everyone.  19 yo granddaughter stays with them every other weekend.  Maintains friendship with several long-term friends who also come and visit and provide support.     BEHAVIORAL OBSERVATIONS:  Orientation: Alert and grossly oriented to person, place, time, and situation  Grooming & hygiene: Intact  Gait: Unable to assess fully due to the patient's instability. As she became unsteady without assistance, she held onto her husband while ambulating.  Motor functioning: Ataxic presentation in gross and fine motor functioning. While completing fine motor tasks that involve drawing, difficulty controlling movements, resulting in the creation of additional lines on the paper that were not intended. No significant resting, postural, or action tremor.  Speech: Normal volume, rate, and rhythm but slurred at times.  Receptive language: No difficulties understanding conversation  Affect: Stable for the majority of the testing session. Frequently tearful during clinical interview, especially when discussing physical changes and their impact on functioning.  Mood: Appeared anxious  Social comportment: Normal eye contact and range of facial expression  Thought process: Logical and goal-directed  Thought content: Appropriate to topic of discussion  Vision: Corrected  Hearing: WNL     NEUROBEHAVIORAL STATUS EXAM FINDINGS:  1.     Gait is cautious; cannot stand unassisted  2.     When her eyes are closed in her chair, she becomes unsteady  3.     Mild right grip strength with mimic  4.     Slow finger-to-nose, becomes ataxic at intention  5.     Mild right slowness on rapid  alternating hand movements  6.  Mild right slowness on rapid alternating finger movements  7.     Incorrect crossed hands on right left orientation, self-corrected  8.     1-2 perseveration on loop drawings within the task itself  9.     More than 2 perseverations on ramparts, to the task itself  10.  Mild constructional problems on cross construction drawing  11.  Inattention within the majority of lines on line bisection, required redirection and clarification  12.  Motor sequencing spontaneous correct with self-correction on initial  13.  Intermittent BL stimulus bound on go/no-go  14.  Impulsive on commands, 3-step midline only  15.  Verbal recall = 1/3 (1/3 CC; 3/3 FC); 1/3 (1/3 CC; 3/3 FC); 2/3 (3/3 CC)     2020 Neuropsychology Consensus  QUALITATIVE DESCRIPTOR GUIDELINES:  Standard Score            Percentile                    Score Label  >130                            >98                              Exceptionally high score  120-129                       91-97                           Above average score  110-119                       75-90                           High average score  90-109                         25-74                           Average score  80-89                           9-24                             Low average score  70-79                           2-8                              Below average score  <70                              <2                                Exceptionally low score     TEST RESULTS:    EXAM A: 02/04/2022  Raw SS %tile Interp   COGNITIVE STATUS                   Montreal Cognitive Assessment   25/30   MCI            PREMORBID FUNCTIONING                  Word Reading (WRAT-4) BLUE 61 96 39 Avg            ATTENTION & WORKING MEMORY                  Digit Span (WAIS-IV)   Low Avg   Forward   Low Avg   Backward   8 9 37 Avg   Sequencing   Low Avg            VISUAL-SPATIAL                  Judgment of Line  Orientation* RBANS 13 82 12 Low Avg   Rey-O (Copy Only)*   21  ?1 Excep Low            LANGUAGE                  [place] Naming Test*   57 96 39 WNL   FAS   37 86 18 Low Avg   Animal Naming   20 91 27 Avg            MEMORY & LEARNING                  TOMM      WNL            [place] Verbal Learning Task-II STD       Trial 1   5 Z -1.0 16 Low Avg   Trial 2   6 Z -1.5 6 Below Avg   Trial 3   5 Z -2.5 1 Excep Low   Trial 4   6 Z -3.0 0.3 Excep Low   Trial 5   6 Z -3.0 0.3 Excep Low   Total Recall   28 T 27 1 Excep Low   Short Delay Free Recall   5 Z -2.0 2 Below Avg   Short Delay Cued Recall   8 Z -1.5 6 Below Avg   Long Delay Free Recall   5 Z -2.5 1 Excep Low   Long Delay Cued Recall   7 Z -2.0 2 Below Avg   Recognition Discriminability   0.9 Z -2.5 1 Excep Low   Forced Choice Recognition   100%                    Brief Visuospatial Memory Test-R FM-1       Total Recall   13 70 2 Below Avg   Delayed Recall   3 57 0.5 Excep Low   Discrimination Index*   5  [11-16] Low Avg            EXECUTIVE FUNCTIONING                   Trail Making Test: M         TMT-M 1 (Part-A)   57'' 64 1 Excep Low   TMT-M 2   37''      TMT-M 3   36''      TMT-M 4  46''      TMT-M 5   34''      TMT-M Delay   43''      Trail Making Test: Part-B   80'' 85 16 Low Avg                 [place] Card Sorting Test STD       Categories Completed*   3  [6-10] Mild Imp   Failures to Maintain Set*   2  [>16] Avg   Total Errors   56 74 4 Below Avg   Perseverative Responses   44 73 4 Below Avg                 Stroop Color-Word Test         Word Score   71 T 25 1 Excep Low   Color Score   53 T 30 2 Below Avg   Color-Word Score   25 T 34 5 Below Avg   Interference Score    T 46 34 Avg            MOOD & PERSONALITY                  Geriatric Depression Scale   11      Functional Activities Questionnaire   6   WNL                 Notes:         *Tests with non-normally distributed normative data            IMPRESSIONS:  [name] is a 51 y.o.  right-handed, white, cisgender, woman, with 16 years of education, referred by [facility] for an evaluation of their cognitive and behavioral function in the context of ataxic gait with a past medical history of Restless Leg Syndrome and depression.     Regarding cognition, in the context of average premorbid functioning, Ms. Florio's profile displayed performances below expectations on measures of auditory/visual learning/memory, processing speed, complex visual spatial planning, and visual problem solving through feedback. In regard to memory functioning, she was noted to have difficulty encoding, consolidating, and retrieving both verbal and visual information. On a verbal memory test, she was not helped by cues or a yes/no paradigm with performance being impacted by a high number of intrusive errors of false positives. Though she evidenced similar encoding difficulties on a visual memory test, she did appear to be helped by a yes/no paradigm, as she accurately recognized 5 out of 6 stimuli. Executive functioning performances were variable such that her performances on a measure of complex attention and switching was in the low average range while her performances on a measure of inhibitory control was in the below average range. Further, on a test of novel problem solving, she obtained 3/6 categories indicating mild impairment, and she made a high number of errors and perseverative responses. Finally, though her visuospatial abilities were estimated in the low average range, she had significant difficulty planning and organizing her drawing of a complex figure. Her working [employer] fast streamer and language abilities were generally intact. Of importance, upper extremity motor difficulties impacted tasks of drawing (e.g., complex figure had large amplitude tremor interference) and graphomotor processing speed measures. Her cognitive profile generally aligned with her subjectively worsening cognitive  complaints of executive function, complex attention, and visual spatial changes since 05/2021.  From an emotional distress perspective, the patient reported a decline in her mood following the  onset of gait and balance problems and was often tearful when discussing these changes. She has also experienced the death of her mother in the past year and noted this as a significant stressor. 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 Her husband agreed that she is now afraid to go anywhere without him and simply will not leave the house. She completes all of her shopping online and receives deliveries. She noted that the physical changes she has experienced have limited her to one story of her house as she cannot navigate the stairs without risk of injury due to weakness and frequent falls. She no longer feels safe to drive due to significant impairment, misjudging distances, driving in the middle of the road, and often feeling like she may hit another car due to getting too close. However, from a cognitive standpoint, she and her husband agree she remains independent on all ADLs/IADLs with all limitations being a direct result of her motor difficulties (e.g., she often has to take breaks due to fatigue while bathing or dressing). From a social support perspective, though her husband provides the majority of support with multiple activities of daily living, she also noted receiving emotional support from her adult children and friends.     Taken together, the patient's current  neurocognitive profile is characterized by variable attention and processing speed, some executive dysfunction, and impaired memory. Given that she and her husband agree that her decline in functioning is a result of physical, not cognitive changes, she does not currently meet criteria for a major neurocognitive disorder. 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 Though her presentation and previous imaging is consistent with possible cerebellar ataxia, her most recent MRI was several months ago and her current cognitive profile indicates possible cortical/subcortical involvement. With that said, cerebellar changes have also been known to cause a similar profile. In addition, her REM sleep behaviors could be concerning for a Parkinson-Plus syndrome. It is recommended that she obtain updated neuroimaging  and additional diagnostic testing to further inform etiology. While emotional distress is not causing this problem, it certainly exacerbates her difficulties, so simultaneously continuing her physical work up and attending support for her emotional functioning are recommended.     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     It is recommended that the patient follow-up with their providers regarding the results of this evaluation.     2.     Follow-up with their neurologist is recommended. Given the evidence on assessment of neurocognitive impairment and balance/gait problems but currently unclear etiology, a full workup including labs and additional neuroimaging appears warranted, though future care is deferred to her medical team.     3.     The patient's current gait/balance and mobility issues will require consistent monitoring and close supervision to avoid  additional falls and injury.  Physical and/or occupational therapy evaluations are recommended to provide appropriate assistance in the context of current limitations. The patient and her husband are encouraged to consult with her treating providers regarding developing a plan for assisting with general physical health.    4.    While the patient's current cognitive difficulties in everyday life were relatively minimal, the progressive nature of their difficulties was concerning for a progressive neurocognitive disorder.  It was recommended that the patient and their family put their care wishes into writing in the anticipation of possible decline and the need for increasing supervision at all levels was necessary.  Someday this could include monitoring for medication compliance, as well as assistance with general health and nutrition.  Supervision regarding their daily routine could become necessary.  Monitoring for changes in ADLS/IADLS was counseled.  We recommend supervision with regard to financial, medical, and legal decisions.  We recommend DPOA and Guardianship issues be resolved.     5.     The patient has already discontinued driving due to safety concerns. At this point, this appears to be the safest course of action.     6.      Given her levels of emotional distress, this patient may benefit from a few sessions of psychotherapy to develop adaptive coping strategies to help manage stressors. Therapy can often have a positive impact on overall quality of life. Ladoris GeneAngelo Giolzetti, Psy.D. is a Counsellorclinical psychologist at [facility] who specializes in older adult populations and can be contacted at: [phone].     7. To compensate for general cognitive difficulties, the following recommendations may help maximize the patient's functioning:  a. Increased structure in their environment.  b. Breaking lengthier and more time-consuming tasks into smaller segments, completing each one before moving onto  the next.  c. Minimizing competing stimuli/distractions in order to maximize their attentional capacity.  d. Write down and organize information to-be-remembered.  e. Utilize a daily planner/calendar. Create daily, medium range (weeks - months), and long range (yearly or more) goals with behaviorally observable steps that will display their successful completion.   f.  Ask for repetition of important information and repeat it back to ensure completeness and accuracy. Have the patient repeat back important information to determine their level of understanding and comprehension.  g. Implement a Journalist, newspaperpillbox organizer checked by a trusted loved one.  h. For older adults attempting to stay mentally active, the [employer] offers free elective courses. [email or web address]     8.    Healthy diet, physical activity, and stress management strategies are recommended, as healthy lifestyle factors are important for physical, emotional, and cognitive wellbeing. Healthy diet with many vegetables, lean proteins, and healthy fats is recommended. Approximately 30 minutes of low-impact physical exercise five days per week is recommended (if approved by medical providers), as physical activity is protective against cognitive changes over time and can help with stress management, improving mood, and improving sleep. Finally, mediation and/or yoga can help to reduce stress. Yoga and meditation videos are available on YouTube. The patient may search the App Store on iPhone or Android devices for apps such as Headspace or Calm that provide guided meditation and progressive muscle relaxation exercises.     9.     Comprehensive feedback regarding the results of this neuropsychological evaluation will be provided for the patient as scheduled. A re-evaluation in 13 to 18 months was recommended. Should the patient experience drastic change or worsening of cognitive problems a re-evaluation would be beneficial in  that their neuropsychological  functioning at that time can be directly compared to the current evaluation. Diagnoses and treatment recommendations can be updated as warranted.        The Neuropsychology Consult Team would like to thank you for allowing [place] to participate in this patient's care. If you have questions after reading the results and impressions of the neuropsychological evaluation, please do not hesitate to contact this provider, Teena Irani. Divit Stipp, Ph.D. (EXT. [phone]).     DICTATED REPORT:  The patient was informed of the purpose of the visit and the limits of confidentiality. Oral informed consent for assessment procedures was provided and agreed upon by the patient. Medical record review, clinical interview, and assessment were completed.     Lindsey Crumbly Marilynne Drivers, MS  Psychology Intern/Supervised Psychologist    Ongoing supervision was provided by Teena Irani. Hilario Quarry, Ph.D., ABPP-CN.    Note signed in Dr. Corlis Leak absence.  The case was discussed with the intern and report was read and edited as indicated and I agree with the above.     Delton Prairie, Psy.D., ABPP-CN  Associate Professor    [facility] Medicine and Psychiatry    Teena Irani. Hilario Quarry, Ph.D., ABPP-CN  Board Certified Clinical Neuropsychologist  Associate Professor    [facility] Medicine and Psychiatry

## 2022-02-17 ENCOUNTER — Encounter (HOSPITAL_COMMUNITY): Payer: Self-pay | Admitting: Neurology

## 2022-02-17 DIAGNOSIS — G9389 Other specified disorders of brain: Secondary | ICD-10-CM

## 2022-02-17 LAB — BASIC METABOLIC PANEL
ANION GAP: 6 mmol/L (ref 4–13)
BUN/CREA RATIO: 17 (ref 6–22)
BUN: 12 mg/dL (ref 8–25)
CALCIUM: 8.5 mg/dL (ref 8.5–10.0)
CHLORIDE: 109 mmol/L (ref 96–111)
CO2 TOTAL: 24 mmol/L (ref 22–30)
CREATININE: 0.71 mg/dL (ref 0.60–1.05)
ESTIMATED GFR: >90 mL/min/BSA (ref >=60)
GLUCOSE: 106 mg/dL (ref 65–125)
POTASSIUM: 4.2 mmol/L (ref 3.5–5.1)
SODIUM: 139 mmol/L (ref 136–145)

## 2022-02-17 LAB — CBC WITH DIFF
BASOPHIL #: <0.1 10*3/uL (ref <=0.20)
BASOPHIL %: 0 %
EOSINOPHIL #: 0.26 10*3/uL (ref <=0.50)
EOSINOPHIL %: 4 %
HCT: 39 % (ref 34.8–46.0)
HGB: 13.1 g/dL (ref 11.5–16.0)
IMMATURE GRANULOCYTE #: <0.1 10*3/uL (ref <0.10)
IMMATURE GRANULOCYTE %: 0 % (ref 0–1)
LYMPHOCYTE #: 1.91 10*3/uL (ref 1.00–4.80)
LYMPHOCYTE %: 32 %
MCH: 28.5 pg (ref 26.0–32.0)
MCHC: 33.6 g/dL (ref 31.0–35.5)
MCV: 85 fL (ref 78.0–100.0)
MONOCYTE #: 0.5 10*3/uL (ref 0.20–1.10)
MONOCYTE %: 8 %
NEUTROPHIL #: 3.29 10*3/uL (ref 1.50–7.70)
NEUTROPHIL %: 56 %
RBC: 4.59 10*6/uL (ref 3.85–5.22)
RDW-CV: 13.8 % (ref 11.5–15.5)
WBC: 6 10*3/uL (ref 3.7–11.0)

## 2022-02-17 LAB — LIPID PANEL
CHOL/HDL RATIO: 4.8
CHOLESTEROL: 178 mg/dL (ref 100–200)
HDL CHOL: 37 mg/dL — ABNORMAL LOW (ref >=50)
LDL CALC: 114 mg/dL — ABNORMAL HIGH (ref <100)
NON-HDL: 141 mg/dL (ref <=190)
TRIGLYCERIDES: 149 mg/dL (ref <150)
VLDL CALC: 25 mg/dL (ref <30)

## 2022-02-17 LAB — MAGNESIUM: MAGNESIUM: 2.1 mg/dL (ref 1.8–2.6)

## 2022-02-17 LAB — CREATINE KINASE (CK), TOTAL, SERUM: CREATINE KINASE: 55 U/L (ref 25–190)

## 2022-02-17 LAB — AST (SGOT): AST (SGOT): 28 U/L (ref 8–45)

## 2022-02-17 LAB — PHOSPHORUS: PHOSPHORUS: 4 mg/dL (ref 2.4–4.7)

## 2022-02-17 LAB — ALT (SGPT): ALT (SGPT): 28 U/L — ABNORMAL HIGH (ref 8–22)

## 2022-02-17 MED ORDER — DIAZEPAM 5 MG/ML INJECTION SYRINGE
5.0000 mg | INJECTION | Freq: Once | INTRAMUSCULAR | Status: AC | PRN
Start: 2022-02-17 — End: 2022-02-17
  Administered 2022-02-17: 5 mg via INTRAVENOUS

## 2022-02-17 NOTE — Care Management Notes (Signed)
[facility]  Care Management Initial Evaluation    Patient Name: [name]  Date of Birth: 14-Feb-1971  Sex: female  Date/Time of Admission: 02/16/2022  9:35 AM  Room/Bed: 23/A  Payor: PEIA / Plan: PEIA/UMR / Product Type: Non Managed Care /   Primary Care Providers:  Oneal Grout, MD, MD (General)    Pharmacy Info:   Preferred Pharmacy       [facility] - [place], [place] - [address]    [address] [place] [address]    Phone: [phone] Fax: [phone]    Hours: Not open 24 hours          Emergency Contact Info:   Extended Emergency Contact Information  Primary Emergency Contact: Ellender,DONALD  Address: [address]           [place], [place] [address] [place]  Mobile Phone: [phone]  Relation: Husband    History:   [name] is a 51 y.o., female, admitted with neurodegenerative disorder     Height/Weight: 165.1 cm (5\' 5" ) / 99.9 kg (220 lb 3.8 oz)     LOS: 1 day   Admitting Diagnosis: Neurodegenerative disorder (CMS HCC) [G31.9]    Assessment:      02/17/22 1456   Assessment Details   Assessment Type Admission   Date of Care Management Update 02/17/22   Date of Next DCP Update 02/20/22   Readmission   Is this a readmission? No   Insurance Information/Type   Insurance type Commercial   Employment/Financial   Patient has Prescription Coverage?  Yes        Name of Insurance Coverage for Medications PEIA   Financial Concerns none   Living Environment   Select an age group to open "lives with" row.  Adult   Lives With spouse   Living Arrangements house   Able to Return to Prior Arrangements yes   Home Safety   Home Assessment: No Problems Identified   Home Accessibility no concerns;stairs to enter home   Care Management Plan   Discharge Planning Status initial meeting   Projected Discharge Date 02/18/22   Discharge plan discussed with: Patient   CM will evaluate for rehabilitation potential no   Discharge Needs Assessment    Outpatient/Agency/Support Group Needs other (see comments)  (TBD)   Equipment Currently Used at Home none   Equipment Needed After Discharge none   Discharge Facility/Level of Care Needs Home (Patient/Family Member/other)(code 1)   Transportation Available family or friend will provide   Referral Information   Admission Type inpatient   Address Verified verified-no changes   Arrived From home or self-care   ADVANCE DIRECTIVES   Does the Patient have an Advance Directive? No, Information Offered and Refused   Home Main Entrance   Number of Stairs, Main Entrance two     GENEAL HUEBERT admitted with neurodegenerative disorder. Pt to have Lab/imaging, MRI and Neuro checks. Assessment completed at bedside with patient. Reviewed Care Managers role with discharge planning  Verified address phone and contacts. Pt lives in a  house with her husband, [name] There are two steps to enter the home and no steps within the home. Transportation is [name]. Confirmed PCP, pharmacy and insurance. Pt does prescription coverage. Pt has no DME Pt is not active with HH services. There is no MPOA/HCS on file.   Pt does not use oxygen at baseline.         Will follow  for d/c planning.       Discharge Plan:  Home (Patient/Family Member/other) (code 1)      The patient will continue to be evaluated for developing discharge needs.     Case Manager: Jacky Kindle, SOCIAL WORKER  Phone: [phone]

## 2022-02-17 NOTE — Procedures (Signed)
Procedure Date:  02/17/2022  Time:  1400  Procedure: Lumbar Puncture  Diagnosis:  Unsteady Gait  Indication:  Diagnostic Tap    Description: After informed consent a surgical time out was performed to confirm correct patient and procedure. Patient was placed in right decubitus position; the L4-L5 lumbar cistern was palpated and identified The site was identified and prepped in the usual sterile fashion. The skin was prepped with chlorhexidine and 1% lidocaine was used.     Pt requested for Valium 5mg  twice, but was still anxious and could not tolerate procedure after a needle poke through skin. Procedure was aborted per patient's request, and needle wasn't advanced into space for csf.      , MD 02/17/2022, 18:08

## 2022-02-17 NOTE — Nurses Notes (Signed)
Discharge orders received. Home care and follow-up appointments reviewed with pt. and family. No new prescriptions. No questions at this time. IV removed, cath tip intact, sight WNL. No assistive devices. Tele pac removed and tele unit informed. Pt transported off floor in wheelchair accompanied by family

## 2022-02-17 NOTE — Ancillary Notes (Signed)
[facility]  Spiritual Care Note    Patient Name:  [name]  Date of Encounter:   02/17/2022    I was asked to visit with Ms. [name] and her husband regarding some anxiety she may be feeling due a recent diagnosis and the test she needs to have done today. We discussed her health decline over the the last two years and how this has impacted her. I facilitated some story telling and gave her some grounding techniques to calm lower her anxiety. I offered to be with them during the procedure as well and told them that spiritual care was available 24/7 at their request. I closed with prayer and they both thanked me.        02/17/22 0845   Clinical Encounter Type   Reason for Visit Patient/Person/Family Request   Referral From Unit Clerk   Visited With Patient;Spouse   Patient Spiritual Encounters   Spiritual Needs/Issues Anxiety;Fear   Spiritual/Coping Resources Beliefs in God/Sacred/Higher Purpose;Loved/supported by family   Coping Explored/supported faith and beliefs;Facilitated story telling;Identified coping/spiritual resources   Ritual Prayer   Other Support Services Provided Non-anxious presence;Anxiety management   Spiritual Care outcomes with Patient   Spiritual/Emotional Processing  Spiritual Care relationship established    Patient Coping More hopeful   Time of Encounters   Start Time 0845   Stop Time 0915   Duration (minutes) 30 Minutes                Janan Halter  Pager: [phone]  Total Time of Encounter: 35 min.

## 2022-02-17 NOTE — Care Plan (Signed)
Problem: Adult Inpatient Plan of Care  Goal: Plan of Care Review  Outcome: Outcome Achieved  Goal: Patient-Specific Goal (Individualized)  Outcome: Outcome Achieved  Goal: Absence of Hospital-Acquired Illness or Injury  Outcome: Outcome Achieved  Intervention: Identify and Manage Fall Risk  Recent Flowsheet Documentation  Taken 02/17/2022 1230 by Bronwen Betters, RN  Safety Promotion/Fall Prevention:   activity supervised   fall prevention program maintained   nonskid shoes/slippers when out of bed   safety round/check completed   motion sensor pad activated  Taken 02/17/2022 0930 by Bronwen Betters, RN  Safety Promotion/Fall Prevention:   activity supervised   fall prevention program maintained   motion sensor pad activated   nonskid shoes/slippers when out of bed   safety round/check completed  Intervention: Prevent and Manage VTE (Venous Thromboembolism) Risk  Recent Flowsheet Documentation  Taken 02/17/2022 0930 by Bronwen Betters, RN  VTE Prevention/Management:   anticoagulant therapy maintained   ambulation promoted   dorsiflexion/plantar flexion performed  Goal: Optimal Comfort and Wellbeing  Outcome: Outcome Achieved  Goal: Rounds/Family Conference  Outcome: Outcome Achieved     Problem: Fall Injury Risk  Goal: Absence of Fall and Fall-Related Injury  Outcome: Outcome Achieved  Intervention: Promote Injury-Free Environment  Recent Flowsheet Documentation  Taken 02/17/2022 1230 by Bronwen Betters, RN  Safety Promotion/Fall Prevention:   activity supervised   fall prevention program maintained   nonskid shoes/slippers when out of bed   safety round/check completed   motion sensor pad activated  Taken 02/17/2022 0930 by Bronwen Betters, RN  Safety Promotion/Fall Prevention:   activity supervised   fall prevention program maintained   motion sensor pad activated   nonskid shoes/slippers when out of bed   safety round/check completed   Pt discharged to home today

## 2022-02-17 NOTE — Progress Notes (Signed)
[facility]  Neurology Progress Note      Alyn, Riedinger, 51 y.o. female  Date of Admission:  02/16/2022  Date of service: 02/17/2022  Date of Birth:  February 23, 1971      Chief Complaint: Unsteady gait  Pt's condition today: stable    Subjective: No events reported overnight. MRI Brain done, pending results.    Vital Signs:  Temp (24hrs) Max:37 C (98.6 F)      Systolic (24hrs), Avg:121 , Min:109 , Max:138     Diastolic (24hrs), Avg:79, Min:65, Max:96    Temp  Avg: 36.6 C (97.9 F)  Min: 36.2 C (97.2 F)  Max: 37 C (98.6 F)  MAP (Non-Invasive)  Avg: 91.7 mmHG  Min: 80 mmHG  Max: 110 mmHG  Pulse  Avg: 79  Min: 73  Max: 86  Resp  Avg: 17.3  Min: 15  Max: 19  SpO2  Avg: 96.8 %  Min: 94 %  Max: 99 %       Today's Physical Exam:  General:alert  Mental status:Alert and oriented x 3  Memory: Registration, Recall, and Following of commands is normal  Attention: Attention and Concentration are normal  Knowledge: Good  Language and Speech: Normal and Normal  Cranial nerves: Cranial nerves 2-12 are normal  Muscle tone: WNL  Motor strength:  Motor strength is normal throughout.  Sensory: Sensory exam in the upper and lower extremities is normal  Gait:  Truncal Ataxia present; Unstable, wide-based gait, not able to tandem walk or walk on heels/toes. No symmetry of balance when standing/ Unable to stand with feet together and eyes closed   Coordination: No Limb Ataxia: Finger to nose: Normal and Heel to shin: Normal   Reflexes: Reflexes are 2/2 throughout    Current Medications:  acetaminophen (TYLENOL) tablet, 650 mg, Oral, Q4H PRN  diazePAM (VALIUM) 5 mg/mL injection, 5 mg, Intravenous, Give in MRI  enoxaparin PF (LOVENOX) 40 mg/0.4 mL SubQ injection, 40 mg, Subcutaneous, Q24H  ondansetron (ZOFRAN) 2 mg/mL injection, 4 mg, Intravenous, Q6H PRN  sertraline (ZOLOFT) tablet, 50 mg, Oral, Daily        I/O:  I/O last 24 hours:    Intake/Output Summary (Last 24 hours) at 02/17/2022 0619  Last data filed at 02/17/2022  7035  Gross per 24 hour   Intake 2050 ml   Output 2000 ml   Net 50 ml     I/O current shift:  08/28 1900 - 08/29 0659  In: 690 [P.O.:690]  Out: 1150 [Urine:1150]    Labs  Please indicate ordered or reviewed)  Reviewed: I have reviewed all lab results.    Review of reports and notes reveal:     Independent Interpretation of images or specimens:  MRI brain in June 2023 which showed significant FLAIR changes, particularly in the brainstem, cerebellum and posterior aspect of the cortex,    Assessment/Plan:  Active Hospital Problems    Diagnosis    Primary Problem: Neurodegenerative disorder (CMS [facility])   This is a 51 y.o. year old female with PMH significant for depression and restless leg syndrome who presents as a direct admit from [facility] clinic for MRI and LP for further evaluation of worsening unsteady gait for over a year, visuospatial processing deficit and recent short term memory loss. MRI brain in June 2023 showed significant FLAIR changes, particularly in the brainstem, cerebellum and posterior aspect of the cortex. Admitted to Neurology for further workup.       Worsening Visio-spatial Difficulties and  Unsteady Gait  Clinical history (progressive worsening visuospatial difficulties, her gait is antalgic with a wide base and stiff arm posture and she has significant fall anxiety) and imaging (MRI brain in June 2023 which showed significant FLAIR changes and cerebellar atrophy, particularly in the brainstem, cerebellum and posterior aspect of the cortex, which would explain the visuospatial processing issues) raise concern for an autoimmune vs paraneoplastic processes       - Repeat MRI ordered; pending  - CT C/A/P ordered; pending   - Neuropsych testing done.   - Plan for LP         - Cell count, glucose, protein, gram stain with culture, Fungal culture, VDRL, HSV1/HSV2 PCR, Lyme AB, CSF ACE, VZV, CMV, enterovirus, Myelin basic protein, cryptococcal antigen, Flow cytometry, CSF cytopathology,           -  Paraneoplastic panel, CSF oligoclonal bands levels.           - Will obtain opening pressure and save remaining CSF at -70 degrees.    - May consider Autoimmune Encephalopathy work-up if other work up negative and no improvement:          ANNA-1 (hu), ANNA-2 (Ri), ANNA-3, PCA-2, Amphiphysin IgG, CRMP-5 IgG, Ma1, Ma2, GAD65 IgG, AGNA (SOX1), VGKC, NMDA, GABA-A, GABA-B, AMPA, mGluR5, Glycine receptor, IgLON5   - May need trial of IVIG depending on CSF analysis/results.           DNR Status this admission:  Full Code  Palliative/Supportive Care consulted?  no  Hospice Consulted?  Not applicable     Current Comorbid Conditions - Neurology H&P  Coma (GCS less than 8): Coma -Not applicable  TIA not applicable     DVT/PE Prophylaxis: Enoxaparin    Elissa Lovett, MD        I saw and examined the patient.  I reviewed the resident's note.  I agree with the findings and plan of care as documented in the resident's note.  Any exceptions/additions are edited/noted.    Georgina Peer, MD

## 2022-02-17 NOTE — Discharge Summary (Signed)
[facility]  DISCHARGE SUMMARY    PATIENT NAME:  [name], [name]  MRN:  [id number]  DOB:  [birth year]    ENCOUNTER DATE:  02/16/2022  INPATIENT ADMISSION DATE: 02/16/2022  DISCHARGE DATE:  02/17/2022    ATTENDING PHYSICIAN: No att. providers found  SERVICE: NEUROLOGY 1  PRIMARY CARE PHYSICIAN: Oneal Grout, MD       No lay caregiver identified.    PRIMARY DISCHARGE DIAGNOSIS: Neurodegenerative disorder (CMS [facility])  Active Hospital Problems    Diagnosis Date Noted    Principal Problem: Neurodegenerative disorder (CMS [facility]) [G31.9] 02/16/2022      Resolved Hospital Problems   No resolved problems to display.     There are no active non-hospital problems to display for this patient.          Current Discharge Medication List        CONTINUE these medications - NO CHANGES were made during your visit.        Details   sertraline 50 mg Tablet  Commonly known as: ZOLOFT   Take 1 Tablet (50 mg total) by mouth Once a day  Refills: 0            Discharge med list refreshed?  YES   NEUROLOGY RISK FACTORS:  In HPI    Allergies   Allergen Reactions    Penicillins Itching, Rash and Swelling     HOSPITAL PROCEDURE(S):   Orders Placed This Encounter   Procedures    BEDSIDE  LUMBAR PUNCTURE       REASON FOR HOSPITALIZATION AND HOSPITAL COURSE   BRIEF HPI:     This is a 51 y.o. year old female with PMH significant for depression and restless leg syndrome who presented as a direct admit from [facility] clinic for MRI and LP for further evaluation of unsteady gait.   Main concerns upon presenting to [facility] ED  - Loss of balance when walking - Started about 1.5 years ago. Reports unsteadiness with walking with a little wide based gait. Reports associated 2 minor falls with no head injury this year. States for both falls, she felt dizzy with spinning sensation after standing up and tripped, falling forward. Reports it worse especially when taking a shower, needs to hold on to bar.  Spinning sensation when gets up to walk, with  associated tinnitus lasts for couple of minutes     - Coordination - Started about 1 year ago. Reports issues with depth perception (feels things are closer to her than it really is) and as a result stopped driving in June 7096. Has history of acting out in dreams but reports its been worse this past year.     - Issues with cognitive especially short term memory loss issues. Reports onset around May 2023. MOCA 20/30 on 01/06/22.     Denies any associated bowel/bladder incontinence, weakness of extremities, numbness, or vision changes/blurry vision. Also denies any recent infection but reports having COVID 1 year ago. Denies any weight loss.    BRIEF HOSPITAL NARRATIVE:       Pts clinical history (progressive worsening visuospatial difficulties, her gait is antalgic with a wide base and stiff arm posture and she has significant fall anxiety) and imaging (MRI brain in June 2023 which showed significant FLAIR changes and cerebellar atrophy, particularly in the brainstem, cerebellum and posterior aspect of the cortex, which would explain the visuospatial processing issues) raised concern for an autoimmune vs paraneoplastic processes. MRI Brain was repeated which showed predominantly  cerebellar parenchymal volume loss, otherwise unremarkable supratentorial brain, and no acute intracranial process.     CT C/A/P was ordered to rule out any malignancy but patient declined. Plan was to get LP but patient was very anxious, did not even tolerate needle poke and requested we abort the procedure. Pt was then scheduled for image guided/NeuroIR LP and she also declined.   Thoroughly discussed with pt why we needed to do LP for CSF analysis to help evaluate for possible Autoimmune or Paraneoplastic causes of her worsening symptoms but she stated she is not in the right state of mind for all the testing, and even not ready for IVIG if we had to initiate.    Pt requested for a home discharge with no further workup done, and stated she  would follow up with Neurology in [place]/ home. Pt was discharged on 02/17/22.      TRANSITION/POST DISCHARGE CARE/PENDING TESTS/REFERRALS:   - Home discharge  - Neurology referral sent to [facility]  - Recommended outpatient image guided LP at [facility]    CONDITION ON DISCHARGE:  A. Ambulation: Up with assistance only  B. Self-care Ability: With partial assistance  C. Cognitive Status Alert and Oriented x 3  D. Code status at discharge:       LINES/DRAINS/WOUNDS AT DISCHARGE:   Patient Lines/Drains/Airways Status       Active Line / Dialysis Catheter / Dialysis Graft / Drain / Airway / Wound       None                    DISCHARGE DISPOSITION:  Home discharge  DISCHARGE INSTRUCTIONS:       Refer to NEUROLOGY - [place] [facility]   Referral Type: Physician Referral-Office Visits   Number of Visits Requested: 1          Elissa Lovett, MD    Copies sent to Care Team         Relationship Specialty Notifications Start End    Shrader, Colin Ina, MD PCP - General FAMILY MEDICINE  02/04/22     Phone: [phone] Fax: [phone]         [address] [place] [address]            Referring providers can utilize [email or web address] to access their referred [facility] Medicine patient's information.              Georgina Peer, MD

## 2022-02-20 NOTE — ED Attending Handoff Note (Deleted)
I performed a history and physical examination of the patient and discussed his/her management with the resident/APP. Please see attestation of ED primary note for further details.     Lindsey Killion M Cache Bills, MD

## 2022-02-21 NOTE — ED Attending Note (Signed)
I performed a history and physical examination of the patient and discussed his/her management with the resident/APP. Please see attestation of ED primary note for further details.     [name] Lindsey Dejane Scheibe, MD

## 2022-02-26 ENCOUNTER — Ambulatory Visit (INDEPENDENT_AMBULATORY_CARE_PROVIDER_SITE_OTHER): Payer: 59 | Admitting: NEUROLOGY

## 2022-02-27 ENCOUNTER — Encounter (INDEPENDENT_AMBULATORY_CARE_PROVIDER_SITE_OTHER): Payer: Self-pay | Admitting: Neurology

## 2022-02-27 NOTE — Telephone Encounter (Signed)
Hi [name],    It is totally up to you. When we see the types of symptoms and imaging findings that you are having, one of the things on our differential is a paraneoplastic process. What this means is there is a possibility that you could have a growth that is causing antibodies to develop, leading to your symptoms. When this happens, it is important to do a CT scan to look for those growths to make sure we are not missing anything. We also do a lumbar puncture to see if we can detect any of those antibodies in your CSF. If we find the CSF antibodies or the growths on the CT scan that would change our management, as we could potentially treat an underlying cause instead of just treating your symptoms symptomatically. I would still be happy to set up a hospitalization here at [facility] where we could run these tests or you can see if the doctor at [facility] would be able to do these tests. Please let me know if you have additional questions and I am happy to help in any way I can.     Best,  JF

## 2022-02-27 NOTE — Telephone Encounter (Signed)
Renard Hamper, RN 02/27/2022 10:28 AM EDT      ----- Message -----  From: [name]  Sent: 02/27/2022 10:25 AM EDT  To: Neuro- Movement  Subject: MRI     Hey Dr Daisey Must! I just don't see why it is necessary to have ANOTHER CT. I don't feel comfortable radiating myself and taking contrast again within 2 weeks and I've also had a repeat MRI with contrast. I'm afraid it will do more damage than good unless there is a sign of a tumor or something questionable.  Dr Andee Poles in [place] is going to set up the IVIG infusion therapy at [facility]. Hopefully this works and I can go back to work.   Thanks for all your help!  [name]

## 2022-02-27 NOTE — Telephone Encounter (Signed)
Hi [name],    I have discussed your case with my colleagues. We are not able to admit you to another hospital, we are only able to admit you to [facility] hospital. I would be happy to schedule you for a planned admission to the general neurology service here at [facility]. During this admission we would plan to do a CT of your chest, abdomen, pelvis, a sedated lumbar puncture, and a trial of treatment with IVIG. If you do not want to come to [facility], you could try going to the emergency department of a different hospital such as [place], but I cannot guarantee that they would be able to do the workup that we are wanting and they may recommend sending you to [place] to complete the workup anyway.     If you would like to be directly admitted to [facility], please let me know and I will get that process started.     Best,  JF

## 2022-02-27 NOTE — Telephone Encounter (Addendum)
From: [name]  Sent: 02/24/2022 10:02 PM EDT  To: Ferdinand Lango, MD, Clovis Pu, RN  Subject: RE: MRI    We could try to do IVIG down there but it's just going to be hard to really do things appropriately.without being able to complete her workup. I don't love doing a first cycle of IVIG as outpatient in someone who has never gotten it before but if we absolutely have to I guess I would rather try that than do nothing, we would just have to be super clear in our documentation that she has declined to do things the way we usually recommend. I know I said one dose earlier, but if we're going to do it we should just see if we can do a full 5 day cycle.    I have to go to [facility] for an AAN thing and won't be back until next Tuesday, if she'd like to also discuss with me I can add her on for a visit with me next Friday Minerva Areola, could do 11:30). If there's any way she would be willing to be readmitted with medication for anxiety that would really be best.       ----- Message -----  From: [name]  Sent: 02/23/2022   2:25 PM EDT  To: Neuro- Movement  Subject: MRI                                              I came to [facility] and admitted myself to [facility] and the Valium worked for the repeat brain MRI but the next day it didn't work with the LP, it just made me so sick and nauseated. I later saw on MyChart that I had Zofran ordered but was never given.  Can we try a trial of the IVIG at [facility] or some where closer?  I have to use a walker now to prevent myself from a fall injury.  I think it's time to try some type of treatment before all my muscles completely stop working.   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   Thank you!

## 2022-03-03 NOTE — Telephone Encounter (Signed)
Okay sounds good- I just wanted to make sure you were informed of all your potential options. Please keep me posted if there is anything else I can do to help.    Best,  JF

## 2022-03-06 ENCOUNTER — Ambulatory Visit (INDEPENDENT_AMBULATORY_CARE_PROVIDER_SITE_OTHER): Payer: 59 | Admitting: NEUROLOGY

## 2022-03-13 ENCOUNTER — Ambulatory Visit: Payer: 59 | Attending: Neurology | Admitting: Neurology

## 2022-03-13 DIAGNOSIS — Z79899 Other long term (current) drug therapy: Secondary | ICD-10-CM | POA: Insufficient documentation

## 2022-03-13 DIAGNOSIS — G319 Degenerative disease of nervous system, unspecified: Secondary | ICD-10-CM | POA: Insufficient documentation

## 2022-03-13 MED ORDER — CLONAZEPAM 1 MG TABLET
1.0000 mg | ORAL_TABLET | Freq: Two times a day (BID) | ORAL | 0 refills | Status: DC
Start: 2022-03-13 — End: 2022-06-01

## 2022-03-13 NOTE — Progress Notes (Unsigned)
TELEMEDICINE DOCUMENTATION:    Patient Location:  {[place] [facility] TELEMED SITE (PATIENT LOCATION):210141016}    Patient/family aware of provider location:  {YES NO:23040}  Patient/family consent for telemedicine:  {YES NO:23040}  Examination observed and performed by:  {[place] [facility] TELEMED PROVIDERS:210141017}    [place] Department of Neurology  Multiple Sclerosis and Clinical Neuroimmunology Clinic      Date: 03/13/2022  Name: [name]  Age:  51 y.o.  Referring Physician:   No referring provider defined for this encounter.    History of Present Illness:  History obtained from:  {HISTORY PROVIDED BY:19225::"patient"}    ------------------------  Please note, some information in this section carried forward from prior notes. New information denoted with a *    Disease categorization:  Date diagnosed:    Current DMT:  Previous DMTs:  Steroid history:    Neurologic history:    Pertinent studies:    ------------------------    [name] is a 51 y.o. female who returns in follow up for No chief complaint on file.         Since last visit:  Speech more slurred  Not sleeping at night  Will sit awake for hours in the morning  Acting out her dreams, afraid she will hurt her family, husband has to scream her name to get her to stop  Anxiety is very high  Was extremely anxious when she went to the hospital    Past Medical History:   Diagnosis Date    MDD (major depressive disorder)     Restless leg syndrome          {Past Surgical History Negative:25851}      Current Outpatient Medications   Medication Sig    sertraline (ZOLOFT) 50 mg Oral Tablet Take 1 Tablet (50 mg total) by mouth Once a day     Allergies   Allergen Reactions    Penicillins Itching, Rash and Swelling     Family Medical History:    None         Social History     Socioeconomic History    Marital status: Married   Occupational History    Occupation: Ecologist   Tobacco Use    Smoking status: Never    Smokeless tobacco: Never   Vaping Use     Vaping Use: Never used   Substance and Sexual Activity    Alcohol use: Never    Drug use: Never       Review of Systems  Gen: No malaise. ID: No infections. HEENT: No hearing or vision changes. Respiratory: No shortness of breath or cough. Cardiac: No chest pain. GU: No hematuria. GI: No GI bleeding. Extremities: No peripheral edema. Musculoskeletal: No joint pain, swelling. Neuro: As above. Psychiatric: No mood changes.    Examination:    Vitals: There were no vitals taken for this visit.      General Exam:  General: No acute distress.  HEENT: No scleral icterus.  Psychiatric: Affect calm.    Neurologic Exam:  Ophthalmoscopic: {opthalm:42510::"Limited undilated exam, no obvious papilledema or pallor."}  Orientation: Awake, alert, appropriately oriented.  Memory: Recent and remote memory appear intact. Formal memory testing not completed today.  Attention: Normal in conversation.  Knowledge: Appropriate.  Language: Fluent with intact comprehension.  Speech: Normal.  Cranial nerves: Pupils are equal and reactive bilaterally. Extraocular movements are intact. Facial sensation intact. Face activates symmetrically. Hearing intact to finger rub bilaterally. Shoulder shrug 5/5 bilaterally. Tongue and uvula  midline.   Sensory: {Sensory:42220}  Muscle tone: Normal.    Muscle exam:  Arm Right Left Leg Right Left   Deltoid {number 1-5:25396::"5"}/5 {number 1-5:25396::"5"}/5 Iliopsoas {number 1-5:25396::"5"}/5 {number 1-5:25396::"5"}/5   Biceps {number 1-5:25396::"5"}/5 {number 1-5:25396::"5"}/5 Quads {number 1-5:25396::"5"}/5 {number 1-5:25396::"5"}/5   Triceps {number 1-5:25396::"5"}/5 {number 1-5:25396::"5"}/5 Hamstrings {number 1-5:25396::"5"}/5 {number 1-5:25396::"5"}/5   Interossei {number 1-5:25396::"5"}/5 {number 1-5:25396::"5"}/5 Ankle Dorsi Flexion {number 1-5:25396::"5"}/5 {number 1-5:25396::"5"}/5   APB {number 1-5:25396::"5"}/5 {number 1-5:25396::"5"}/5 Ankle Plantar Flexion {number 1-5:25396::"5"}/5 {number  1-5:25396::"5"}/5     Reflexes:   RJ BJ TJ KJ AJ Plantars   Right {Reflex:42509::"2+"} {Reflex:42509::"2+"} {Reflex:42509::"2+"} {Reflex:42509::"2+"} {Reflex:42509::"2+"} {U/D/M:42218::"Downgoing"}   Left {Reflex:42509::"2+"} {Reflex:42509::"2+"} {Reflex:42509::"2+"} {Reflex:42509::"2+"} {Reflex:42509::"2+"} {U/D/M:42218::"Downgoing"}     Coordination: Finger-nose without dysmetria bilaterally.  Gait: Normal casual gait. Negative Romberg.    Assessment and Plan:    No diagnosis found.  No orders of the defined types were placed in this encounter.    No follow-ups on file.    Wearables candidate? {YES/NO:20834}  Fenebrutinib candidate? {YES/NO:20834}  Ofatumumab candidate?{YES/NO:20834}  Fatigue study? {YES/NO:20834}  Cognitive rehab? {YES/NO:20834}  Smoking cessation? {YES/NO:20834}      Dorette Grate, MD 03/13/2022   Assistant Professor, Department of Neurology

## 2022-03-19 ENCOUNTER — Ambulatory Visit (INDEPENDENT_AMBULATORY_CARE_PROVIDER_SITE_OTHER): Payer: 59 | Admitting: Clinical Neuropsychologist

## 2022-03-19 ENCOUNTER — Encounter (INDEPENDENT_AMBULATORY_CARE_PROVIDER_SITE_OTHER): Payer: Self-pay | Admitting: NEUROLOGY

## 2022-03-31 ENCOUNTER — Encounter (INDEPENDENT_AMBULATORY_CARE_PROVIDER_SITE_OTHER): Payer: Self-pay | Admitting: Neurology

## 2022-04-01 ENCOUNTER — Encounter (INDEPENDENT_AMBULATORY_CARE_PROVIDER_SITE_OTHER): Payer: Self-pay | Admitting: Neurology

## 2022-04-01 NOTE — Telephone Encounter (Signed)
Will admit friday

## 2022-04-01 NOTE — Telephone Encounter (Addendum)
Talked via [place].      ----- Message from Nolen Mu, RN sent at 03/31/2022  1:59 PM EDT -----  Regarding: FW: Rosalio Loud, MD  ----- Message from Herbert Deaner sent at 03/31/2022  1:54 PM EDT -----  Laurette Sch[facility] office called to tell you that the pt keeps canceling her appts      You had place order in System   # [id number]      Lovena Le [phone] ext [phone]

## 2022-04-01 NOTE — Telephone Encounter (Signed)
-----   Message from Nolen Mu, RN sent at 04/01/2022 12:35 PM EDT -----  Regarding: FW: Hospital admission  Contact: [phone]  FYI for Friday. I will be at the leadership conference if you don't mind calling it in Friday morning.  ----- Message -----  From: [name]  Sent: 04/01/2022  11:29 AM EDT  To: Neuro- Ms  Subject: Hospital admission                               Any specific time I should be there?  (I'm not trying to waste your time with all these questions, with living so far away, we could come up Thurs and stay the night in a hotel and be at the hospital early.).  Thank you!

## 2022-04-03 ENCOUNTER — Inpatient Hospital Stay (HOSPITAL_COMMUNITY): Payer: 59 | Admitting: Student in an Organized Health Care Education/Training Program

## 2022-04-03 ENCOUNTER — Encounter (HOSPITAL_COMMUNITY): Payer: Self-pay

## 2022-04-03 ENCOUNTER — Inpatient Hospital Stay
Admission: AD | Admit: 2022-04-03 | Discharge: 2022-04-07 | DRG: 057 | Disposition: A | Discharge status: Home / Self Care | Payer: 59 | Source: Ambulatory Visit | Attending: Neurology | Admitting: Neurology

## 2022-04-03 DIAGNOSIS — G3189 Other specified degenerative diseases of nervous system: Principal | ICD-10-CM | POA: Diagnosis present

## 2022-04-03 DIAGNOSIS — R1310 Dysphagia, unspecified: Secondary | ICD-10-CM | POA: Diagnosis present

## 2022-04-03 DIAGNOSIS — Z79890 Hormone replacement therapy (postmenopausal): Secondary | ICD-10-CM

## 2022-04-03 DIAGNOSIS — Z9181 History of falling: Secondary | ICD-10-CM

## 2022-04-03 DIAGNOSIS — E538 Deficiency of other specified B group vitamins: Secondary | ICD-10-CM | POA: Diagnosis present

## 2022-04-03 DIAGNOSIS — Z880 Allergy status to penicillin: Secondary | ICD-10-CM

## 2022-04-03 DIAGNOSIS — R3915 Urgency of urination: Secondary | ICD-10-CM | POA: Diagnosis not present

## 2022-04-03 DIAGNOSIS — R296 Repeated falls: Secondary | ICD-10-CM | POA: Diagnosis present

## 2022-04-03 DIAGNOSIS — F419 Anxiety disorder, unspecified: Secondary | ICD-10-CM | POA: Diagnosis present

## 2022-04-03 DIAGNOSIS — Z79899 Other long term (current) drug therapy: Secondary | ICD-10-CM

## 2022-04-03 DIAGNOSIS — R42 Dizziness and giddiness: Secondary | ICD-10-CM | POA: Diagnosis not present

## 2022-04-03 DIAGNOSIS — G4700 Insomnia, unspecified: Secondary | ICD-10-CM | POA: Diagnosis present

## 2022-04-03 DIAGNOSIS — F329 Major depressive disorder, single episode, unspecified: Secondary | ICD-10-CM | POA: Diagnosis present

## 2022-04-03 DIAGNOSIS — G2581 Restless legs syndrome: Secondary | ICD-10-CM | POA: Diagnosis present

## 2022-04-03 LAB — CREATINE KINASE (CK), TOTAL, SERUM OR PLASMA: CREATINE KINASE: 60 U/L (ref 25–190)

## 2022-04-03 LAB — BASIC METABOLIC PANEL
ANION GAP: 7 mmol/L (ref 4–13)
BUN/CREA RATIO: 14 (ref 6–22)
BUN: 11 mg/dL (ref 8–25)
CALCIUM: 9.3 mg/dL (ref 8.6–10.2)
CHLORIDE: 106 mmol/L (ref 96–111)
CO2 TOTAL: 26 mmol/L (ref 22–30)
CREATININE: 0.78 mg/dL (ref 0.60–1.05)
ESTIMATED GFR - FEMALE: >90 mL/min/BSA (ref >=60)
GLUCOSE: 119 mg/dL (ref 65–125)
POTASSIUM: 3.6 mmol/L (ref 3.5–5.1)
SODIUM: 139 mmol/L (ref 136–145)

## 2022-04-03 LAB — CBC
HCT: 38.3 % (ref 34.8–46.0)
HGB: 12.9 g/dL (ref 11.5–16.0)
MCH: 28.9 pg (ref 26.0–32.0)
MCHC: 33.7 g/dL (ref 31.0–35.5)
MCV: 85.7 fL (ref 78.0–100.0)
MPV: 10.4 fL (ref 8.7–12.5)
PLATELETS: 255 10*3/uL (ref 150–400)
RBC: 4.47 10*6/uL (ref 3.85–5.22)
RDW-CV: 13.5 % (ref 11.5–15.5)
WBC: 7.6 10*3/uL (ref 3.7–11.0)

## 2022-04-03 LAB — PHOSPHORUS: PHOSPHORUS: 3.6 mg/dL (ref 2.4–4.7)

## 2022-04-03 LAB — AST (SGOT): AST (SGOT): 32 U/L (ref 8–45)

## 2022-04-03 LAB — MAGNESIUM: MAGNESIUM: 1.8 mg/dL (ref 1.8–2.6)

## 2022-04-03 LAB — ALT (SGPT): ALT (SGPT): 36 U/L — ABNORMAL HIGH (ref 8–22)

## 2022-04-03 MED ORDER — ACETAMINOPHEN 325 MG TABLET
650.0000 mg | ORAL_TABLET | ORAL | Status: DC
Start: 2022-04-03 — End: 2022-04-05
  Administered 2022-04-03 – 2022-04-04 (×2): 650 mg via ORAL
  Filled 2022-04-03 (×2): qty 2

## 2022-04-03 MED ORDER — ACETAMINOPHEN 325 MG TABLET
650.0000 mg | ORAL_TABLET | ORAL | Status: DC | PRN
Start: 2022-04-03 — End: 2022-04-07

## 2022-04-03 MED ORDER — SERTRALINE 50 MG TABLET
50.0000 mg | ORAL_TABLET | Freq: Every evening | ORAL | Status: DC
Start: 2022-04-03 — End: 2022-04-07
  Administered 2022-04-03 – 2022-04-06 (×4): 50 mg via ORAL
  Filled 2022-04-03 (×4): qty 1

## 2022-04-03 MED ORDER — ENOXAPARIN 40 MG/0.4 ML SUBCUTANEOUS SYRINGE
40.0000 mg | INJECTION | SUBCUTANEOUS | Status: DC
Start: 2022-04-03 — End: 2022-04-07
  Administered 2022-04-03 – 2022-04-06 (×4): 40 mg via SUBCUTANEOUS
  Filled 2022-04-03 (×4): qty 0.4

## 2022-04-03 MED ORDER — PROMETHAZINE 25 MG TABLET
12.5000 mg | ORAL_TABLET | ORAL | Status: DC
Start: 2022-04-03 — End: 2022-04-05
  Administered 2022-04-03 – 2022-04-04 (×2): 12.5 mg via ORAL
  Filled 2022-04-03 (×2): qty 1

## 2022-04-03 MED ORDER — DIPHENHYDRAMINE 25 MG CAPSULE
25.0000 mg | ORAL_CAPSULE | ORAL | Status: DC
Start: 2022-04-03 — End: 2022-04-05
  Administered 2022-04-03 – 2022-04-04 (×2): 25 mg via ORAL
  Filled 2022-04-03 (×2): qty 1

## 2022-04-03 MED ORDER — IMMUNE GLOB,GAMM(IGG) 10 %-PRO-IGA 0 TO 50 MCG/ML INTRAVENOUS SOLUTION
400.0000 mg/kg | INTRAVENOUS | Status: DC
Start: 2022-04-03 — End: 2022-04-05
  Administered 2022-04-03 – 2022-04-04 (×2): 25000 mg via INTRAVENOUS
  Administered 2022-04-05: 0 mg via INTRAVENOUS
  Filled 2022-04-03 (×3): qty 250

## 2022-04-03 MED ORDER — CLONAZEPAM 1 MG TABLET
1.0000 mg | ORAL_TABLET | Freq: Two times a day (BID) | ORAL | Status: DC | PRN
Start: 2022-04-03 — End: 2022-04-07

## 2022-04-03 NOTE — H&P (Signed)
[facility]   Neurology H&P     [name], [name], 51 y.o. female  Date of Admission:  04/03/2022  Date of Birth:  09-15-1970    PCP: Ancil Boozer, MD    Information obtained from: patient, spouse, and daughter  Chief Complaint: gait impairment    UTM:LYYTKPT A Croson is a 51 y.o., White female who presents as a direct admit from Dr. 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 Patient reports progressive gait impairment since January 2022. She has been having issues with going up steps and has had to move her bedroom to the first floor. She has not been upstairs in her house in over 1 year. She reports 3 falls in the past year. Her most recent fall was yesterday. She tried to get up off of the sofa and fell onto the ground onto her right shoulder. She has been using a walker at home. She also reports progressive weakness in her arms and feels like she has to pause and take a break while brushing her hair. She states that she has issues with saying a full sentence and feels like she has to take a breath in between words. She denies diplopia and ptosis. She reports difficulty with swallowing for many years but this has not worsened in the past few years.     Per chart review: Patient has been having gait impairment, falls, impaired spatial judgment and depth perception, cognitive decline, and mood changes for the past 1.5 years. Patient was initially direct admitted in August 2023 for LP and trial of IVIG and steroids, but she declined and left the hospital. Patient contacted Dr. Leonides Schanz on 03/31/22 asking for direct admission for IVIG due to her symptoms persisting.     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 She has not been driving since June 4656.    Admission Source:  Clinic referral  Advance Directives:  None-Discussed  Hospice involvement prior to admission?  Not  applicable      Past Medical History:   Diagnosis Date    MDD (major depressive disorder)     Restless leg syndrome          Medications Prior to Admission       Prescriptions    butalbital-acetaminophen-caffeine (FIORICET) 50-325-40 mg Oral Tablet    Take 1 Tablet by mouth Every 6 hours as needed    Patient not taking:  Reported on 04/03/2022    clonazePAM (KLONOPIN) 1 mg Oral Tablet    Take 1 Tablet (1 mg total) by mouth Twice daily    conjugated estrogens (PREMARIN) 0.625 mg/gram Vaginal Cream    Insert 0.5 g into the vagina Once a day    Patient not taking:  Reported on 04/03/2022    ergocalciferol, vitamin D2, (DRISDOL) 1,250 mcg (50,000 unit) Oral Capsule    Take 1 Capsule (50,000 Units total) by mouth Every 7 days    Patient not taking:  Reported on 04/03/2022    PARoxetine (PAXIL) 10 mg Oral Tablet    Take 1 Tablet (10 mg total) by mouth Once a day    Patient not taking:  Reported on 04/03/2022    sertraline (ZOLOFT) 50 mg Oral Tablet    Take 1 Tablet (50 mg total) by mouth Once a day    traZODone (DESYREL) 50 mg Oral Tablet    Take 1 Tablet (  50 mg total) by mouth Every night as needed    Patient not taking:  Reported on 04/03/2022          Allergies   Allergen Reactions    Penicillins Itching, Rash and Swelling     Social History     Tobacco Use    Smoking status: Never    Smokeless tobacco: Never   Substance Use Topics    Alcohol use: Never     Past Family History: No family history of progressive muscle weakness.      ROS: Other than ROS in the HPI, all other systems were negative.    Exam:  Temperature: 36.6 C (97.9 F)  Heart Rate: 73  BP (Non-Invasive): 95/61  Respiratory Rate: 16  SpO2: 99 %  General: No acute distress.  HENT: Mucous membranes are moist.  Neck: Supple.  Lungs: No respiratory distress. No accessory muscle use.  Cardiovascular: Regular rate and rhythm.  Abdomen: No abdominal distension.  Extremities: No calf tenderness.  Glasgow: Eye opening:  4 spontaneous, Verbal response:  5  oriented, Best motor response:  6 obeys commands  Mental status:  Level of Consciousness: alert  Orientations: Alert and oriented x 3  Memory: Normal memory to conversation. Following commands.  Attention: Normal attention and concentration to conversation.  Knowledge: Appropriate.  Language: No aphasia.  Speech: No dysarthria.  Cranial nerves:   CN2: Visual acuity and fields intact  CN 3,4,6: EOMI, PERRLA. No fatigable ptosis.  CN 5: Facial sensation intact  CN 7: Face symmetrical  CN 8: Hearing grossly intact  CN 9,10: Uvula is midline.  CN 11: Sternocleidomastoid and Trapezius have normal strength.  CN 12: Tongue normal with no fasiculations or deviation  Gait, Coordination, and Reflexes:   Gait: Wide-based, ataxic gait.  Coordination: Slight ataxia with finger to nose bilaterally.    Muscle tone: WNL  Muscle exam  Arm Right Left Leg Right Left   Deltoid 5/5 5/5 Iliopsoas 4/5 4/5   Biceps 5/5 5/5 Quads 5/5 5/5   Triceps 5/5 5/5 Hamstrings 5/5 5/5   Wrist Extension 5/5 5/5 Ankle Dorsi Flexion 5/5 5/5   Wrist Flexion 5/5 5/5 Ankle Plantar Flexion 5/5 5/5   Interossei 5/5 5/5 Ankle Eversion 5/5 5/5   APB 5/5 5/5 Ankle Inversion 5/5 5/5       Reflexes   RJ BJ TJ KJ AJ Plantars Hoffman's   Right 2+ 2+ 2+ 3+ 3+ Equiovocal Not present   Left 2+ 2+ 2+ 3+ 3+ Upgoing Not present   *Cross-adductors reflex present bilaterally.    Sensory: Sensory exam in the upper and lower extremities is normal to light touch, vibration, and proprioception.    Labs:    I have reviewed all lab results.    Review of reports and notes reveal:   Independent Interpretation of images or specimens:  MRI brain w/wo contrast on 02/16/22: advanced cerebellar parenchymal volume loss.    Assessment/Plan:  Active Hospital Problems    Diagnosis    Primary Problem: Weakness       [name] is a 51 y.o. 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 Patient has had progressive gait  impairment, impaired spatial judgment and depth perception, cognitive decline, and mood changes since January 2022. She also reports fatigable weakness and difficulty with speaking in full sentences without having to take a breath. On exam, she has a wide-based ataxic gait and ataxia with finger to  nose testing bilaterally. She has normal strength except for 4/5 hip flexors bilaterally. She has hyperreflexia in bilateral lower extremities. Patient had initially been direct admitted in August 2023 for MRI, LP, IVIG, and steroids, but she left the hospital after completing the MRI. MRI brain showed advanced cerebellar degeneration. She reached out to Dr. Elesa Massed on 10/10 asking for direct admission for IVIG due to recurrent issues with gait. Discussed LP with patient and she declines.    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  no  Hospice Consulted?  Not applicable    Current Comorbid Conditions - Neurology H&P  Severe Brain Conditions: NA  Coma (GCS less than 8): Coma -Not applicable  TIA not applicable  Encephalopathy:  no  Encephalitis-Not applicable  Seizure-Not applicable   N/A  Renal Failure: No  Coagulopathy Not applicable  N/A  Fatigue/Debility No Weakness     DVT/PE Prophylaxis: Enoxaparin    01/18/22, DO, 04/03/2022  PGY-3, Neurology      I saw patient on 10/14 please see my addendum of that note        I reviewed the resident's note.  I agree with the findings and plan  of care as documented in the resident's note.  Any exceptions/additions are edited/noted.    Nicholas Lose, MD

## 2022-04-03 NOTE — Nurses Notes (Signed)
04/03/2022  Patient arrived to unit as direct admit. Service notified of patient's arrival. Patient oriented to unit. Sitter select in place. Prescription glasses at bedside.  Adalie Mand C. Sabra Heck, RN

## 2022-04-04 DIAGNOSIS — R270 Ataxia, unspecified: Secondary | ICD-10-CM

## 2022-04-04 NOTE — Pharmacy (Signed)
Pharmacy Medication Reconciliation    Patient Name: [name], [name]  Date of Service: 04/04/2022  Date of Admission: 04/03/2022  Date of Birth: [birth year]  Length of Stay:   1 day   Service: NEUROLOGY 1      Transitions of Care:  Discharge Pharmacy services were discussed with the patient and the Meds to [facility] flowsheet and preferred pharmacy information were updated in EMR if applicable and able to assess with patient.    Information was collected from:  Patient, Pharmacy, and Previous Records    Pharmacy:   [facility]  [address]  [place] [address]  Phone: [phone] Fax: [phone]      Clarified Prior to Admission Medications:  Prior to Admission medications    Medication Sig Taking Resumed Y/N (RPh) Comments   clonazePAM (KLONOPIN) 1 mg Oral Tablet Take 1 Tablet (1 mg total) by mouth Twice daily as needed Yes Yes 9/25 #60    Per patient, she is only using as needed. She reports taking her first dose yesterday, as she was feeling anxious.      sertraline (ZOLOFT) 50 mg Oral Tablet Take 1 Tablet (50 mg total) by mouth Once a day Yes Yes 9/25 #30        Did patient's home medication list require updates? Yes    Medications UPDATED on Prior to Admission Med List:   - clonazepam: changed from scheduled to PRN    Medications ADDED to Prior to Admission Med List: None    Allergies:    Allergies   Allergen Reactions    Penicillins Itching, Rash and Swelling       Medication History Collected by: Georgiana Shore, PHARMACY INTERN      Did pharmacist make suggestions for medication reconciliation? No    Medications REMOVED from home medication list: None    Pharmacist Recommendations: None at this time    Lamont Snowball, PharmD

## 2022-04-04 NOTE — Care Plan (Signed)
[place]  Physical Therapy Initial Evaluation    Patient Name: [name]  Date of Birth: 1971/04/28  Height: Height: 165.1 cm (5\' 5" )  Weight: Weight: 89.1 kg (196 lb 6.9 oz)  Room/Bed: 09/A  Payor: [employer] / Plan: PE[facility]R / Product Type: Non Managed Care /     Assessment:      (P) Pt seen for PT evaluation and demonstrates impairments with depth perception, balance, coordination, mild strength deficiets, and decrease activity tolerance affecting her independence and mobility. Pt reports a progressive weakness over the last year and a half requring her  to use  walker for mobility. She has had falls at home and unable to go up and down steps. At this time she pt requires Min A of 2 for STS transfers. She will benefit from IPR however will continue to follow and upgrade rec's as pt progresses following continued IVIG therapy.    Discharge Needs:    Equipment Recommendation: (P) TBD      The patient presents with mobility limitations due to impaired balance, impaired strength, and impaired functional activity tolerance that significantly impair/prevent patient's ability to participate in mobility-related activities of daily living (MRADLs) including  ambulation and transfers in order to safely complete, safely entering/exiting the home, in reasonable time. This functional mobility deficit can be sufficiently resolved with the use of a (P) TBD  in order to decrease the risk of falls, morbidity, and mortality in performance of these MRADLs.  Patient is able to safely use this assistive device.    Discharge Disposition: (P) inpatient rehabilitation facility    JUSTIFICATION OF DISCHARGE RECOMMENDATION   Based on current diagnosis, functional performance prior to admission, and current functional performance, this patient requires continued PT services in (P) inpatient rehabilitation facility in order to achieve significant functional improvements in these deficit areas: (P)  aerobic capacity/endurance, gait, locomotion, and balance, muscle performance, motor function.        Plan:   Current Intervention: (P) balance training, bed mobility training, gait training, strengthening, stair training, transfer training  To provide physical therapy services (P) 1x/day, minimum of 2x/week  for duration of (P) until discharge.    The risks/benefits of therapy have been discussed with the patient/caregiver and he/she is in agreement with the established plan of care.       Subjective & Objective        04/04/22 1205   Therapist Pager   PT Assigned/ Pager # Kenishia Plack [phone]   Rehab Session   Document Type evaluation   Total PT Minutes: 15   Patient Effort adequate   Symptoms Noted Comment pt is emotional during session   General Information   Patient Profile Reviewed yes   Onset of Illness/Injury or Date of Surgery 04/03/22   Pertinent History of Current Functional Problem ynthia A Sabo is a 51 y.o., White female who presents as a direct admit from Dr. 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 Patient reports progressive gait impairment since January 2022. She has been having issues with going up steps and has had to move her bedroom to the first floor. She has not been upstairs in her house in over 1 year. She reports 3 falls in the past year. Her most recent fall was yesterday. She tried to get up off of the sofa and fell onto the ground onto her right shoulder. She has been using a walker at home. She also reports progressive weakness in her arms and  feels like she has to pause and take a break while brushing her hair. She states that she has issues with saying a full sentence and feels like she has to take a breath in between words. She denies diplopia and ptosis. She reports difficulty with swallowing for many years but this has not worsened in the past few years.   Respiratory Status room air   Existing Precautions/Restrictions full code;fall precautions   Mutuality/Individual  Preferences   Individualized Care Needs OOB with Clarise Cruz Steady x 1   Plan of Care Reviewed With patient;spouse;daughter   Living Environment   Lives With spouse   Living Arrangements house   Home Assessment: Stairs in [place] Accessibility stairs to enter home;stairs within home   [place] Pt lives with her spouse and has 2 STE and a flight of steps within her home. She is staying on the main level for the past year d/t progressive weakness and recent falls. She reports she has not driven since June [id number] of 2023. Pt reports she also fatigues quickly and has to take breaks between each task and takes longer to complete tasks. She reports she has to rest 30 to 40 minutes before she feels rested up to start another task.   Stairs Within Home, Primary   Stairs, Within Home, Primary 12   Home Main Entrance   Number of Stairs, Main Entrance two   Functional Level Prior   Prior Functional Level Comment She is staying on the main level for the past year d/t progressive weakness and recent falls. She has been using a FWW for mobility in the past year and if she goes without a walker she needs to hold onto objects. She said her last fall she landed on her right shoulder and is painful.  She is now sitting to bathe also. She reports she has not driven since June [id number] of 2023. 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   Self-Care   Equipment Currently Used at Home yes   Equipment Currently Used at [employer], rolling   Pre Treatment Status   Pre Treatment Patient Status Patient supine in bed;Call light within reach;Telephone within reach;Patient safety alarm activated;Nurse approved session   Support Present Pre Treatment  Family present   Communication Pre Treatment  Nurse   Cognitive Assessment/Interventions   Behavior/Mood Observations alert;cooperative;labile   Orientation Status oriented x 4   Attention WNL/WFL   Vital Signs   Vitals Comment no s/s distress   Pain  Assessment   Pre/Posttreatment Pain Comment pain in right shoulder d/t  recent fall at home   Vision Assessment/Interventions   Visual Impairment/Limitations visual impairment, bilaterally  (depth impairment)   RLE Assessment   RLE Assessment X-Exceptions   RLE ROM WFL   RLE Strength grossly 5/5 except hip flexor 4+/5   LLE Assessment   LLE Assessment X-Exceptions   LLE ROM WFL   LLE Strength grossly 5/5 except hip flexor 4+/5   Bed Mobility Assessment/Treatment   Supine-Sit Independence contact guard assist   Sit to Supine, Independence supervision required   Transfer Assessment/Treatment   Sit-Stand Independence minimum assist (75% patient effort);verbal cues required;2 person assist required   Stand-Sit Independence contact guard assist;verbal cues required;2 person assist required   Sit-Stand-Sit, Assist Device side by side   Transfer Impairments balance impaired;endurance;coordination impaired;strength decreased   Transfer Comment CGA x 2 for STS transfers with Min HHA  x  2 once standing d/t impaired balance. mild retropulsive while standing   Gait Assessment/Treatment   Comment NT pt reports she took a shower and just brushed her hair and is very tired and became emotional.   Balance Skill Training   Comment HHA   Sitting Balance: Static good balance   Sitting, Dynamic (Balance) fair + balance   Sit-to-Stand Balance fair - balance   Standing Balance: Static fair - balance   Systems Impairment Contributing to Balance Disturbance musculoskeletal;neuromuscular;visual   Post Treatment Status   Post Treatment Patient Status Call light within reach;Telephone within reach;Patient safety alarm activated;Patient supine in bed   Support Present Post Treatment  Family present   Communication Post Treatement Nurse   Plan of Care Review   Plan Of Care Reviewed With patient   Basic Mobility Am-PAC/6Clicks Score (APPROVED Staff)   Turning in bed without bedrails 4   Lying on back to sitting on edge of flat bed 4   Moving to  and from a bed to a chair 3   Standing up from chair 3   Walk in room 3   Climbing 3-5 steps with railing 2   6 Clicks Raw Score total 19   Standardized (t-scale) score 42.48   Exercise/Activity Level Performed 5- Static standing>1 minute   Physical Therapy Clinical Impression   Assessment Pt seen for PT evaluation and demonstrates impairments with depth perception, balance, coordination, mild strength deficiets, and decrease activity tolerance affecting her independence and mobility. Pt reports a progressive weakness over the last year and a half requring her  to use  walker for mobility. She has had falls at home and unable to go up and down steps. At this time she pt requires Min A of 2 for STS transfers. She will benefit from IPR however will continue to follow and upgrade rec's as pt progresses following continued IVIG therapy.   Criteria for Skilled Therapeutic yes;meets criteria   Pathology/Pathophysiology Noted musculoskeletal;neuromuscular   Impairments Found (describe specific impairments) aerobic capacity/endurance;gait, locomotion, and balance;muscle performance;motor function   Functional Limitations in Following  self-care;home management   Disability: Inability to Perform community/leisure   Rehab Potential good   Therapy Frequency 1x/day;minimum of 2x/week   Predicted Duration of Therapy Intervention (days/wks) until discharge   Anticipated Equipment Needs at Discharge (PT) TBD   Anticipated Discharge Disposition inpatient rehabilitation facility   Evaluation Complexity Justification   Patient History: Co-morbidity/factors that impact Plan of Care 0 none that impact Plan of Care   Examination Components Transfers;Bed mobility;Strength;Balance   Presentation Evolving: Symptoms, complaints, characteristics of condition changing &/or cognitive deficits present   Clinical Decision Making Moderate complexity   Evaluation Complexity Moderate complexity   Care Plan Goals   PT Rehab Goals Bed Mobility  Goal;Gait Training Goal;Stairs Training Goal;Transfer Training Goal   Bed Mobility Goal   Bed Mobility Goal, Date Established 04/04/22   Bed Mobility Goal, Time to Achieve by discharge   Bed Mobility Goal, Activity Type all bed mobility activities   Bed Mobility Goal, Independence Level independent   Gait Training  Goal, Distance to Achieve   Gait Training  Goal, Date Established 04/04/22   Gait Training  Goal, Time to Achieve by discharge   Gait Training  Goal, Independence Level independent;modified independence   Gait Training  Goal, Assist Device least restricted assistive device   Stairs Training Goal   Stairs Training Goal, Date Established 04/04/22   Stairs Training Goal, Time to Achieve by discharge   Stairs  Training Goal, Independence Level modified independence;independent   Stairs Training Goal, Assist Device least restrictive assistive device   Transfer Training Goal   Transfer Training Goal, Date Established 04/04/22   Transfer Training Goal, Time to Achieve by discharge   Transfer Training Goal, Activity Type all transfers   Transfer Training Goal, Independence Level modified independence;independent   Transfer Training Goal, Assist Device least restrictrictive assistive device   Planned Therapy Interventions, PT Eval   Planned Therapy Interventions (PT) balance training;bed mobility training;gait training;strengthening;stair training;transfer training       Therapist:   Wenda Overland, PT   Pager #: [phone]

## 2022-04-04 NOTE — Progress Notes (Signed)
[facility]  Neurology Progress Note      [name], [name], 51 y.o. female  Date of Admission:  04/03/2022  Date of service: 04/04/2022  Date of Birth:  April 20, 1971      Chief Complaint: gait impairment  Pt's condition today: stable    Subjective:   Patient sitting up in bed. She slept for a few hours overnight. Tolerated IVIG well. Husband at bedside.    Per chart review, patient had an MRI brain completed in December 2022. After thinking about it more, she thinks her symptoms may have started about 2 years ago but she may have attributed it to other causes . She denies current or previous heavy alcohol use. 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   denies any family history of neurologic disorders, patient has 2 healthy siblings, 3 healthy kids, mother did have lung ca but was smoker and dad passed age 10 from CAD (also smoker), no known cousins or nephews with intellectual disability or other neurologic disorders  no history of ETOH or drug use  denies any issues with coordination when younger, was having subtle coordination problems before the initial timeline she gave [place] like trouble doing dissections, or having to lean in the counter/table when performing something. if the room is full of people feels balance is worse, denies oscillopsia or diplopia but depth perception is very bad when actively driving otherwise does not perceive this.   Husband says she does have dysarthria compared to her usual speech     Vital Signs:  Temp (24hrs) Max:37.2 C (99 F)      Systolic (40NUU), VOZ:366 , Min:95 , YQI:347     Diastolic (42VZD), GLO:75, Min:59, Max:98    Temp  Avg: 36.7 C (98.1 F)  Min: 36.5 C (97.7 F)  Max: 37.2 C (99 F)  MAP (Non-Invasive)  Avg: 73 mmHG  Min: 71 mmHG  Max: 77 mmHG  Pulse  Avg: 75.5  Min: 67  Max: 82  Resp  Avg: 17  Min: 16  Max: 18  SpO2  Avg: 98.3 %  Min: 97 %  Max: 99 %       Today's Physical Exam:  General:alert  Mental status:Alert and oriented x  3  Memory: Memory is normal to conversation. Follows commands.  Attention: Attention and concentration are normal to conversation.  Knowledge: Appropriate.  Language and Speech: No aphasia. No dysarthria.  Cranial nerves: Cranial nerves 2-12 are normal rotational nystagmus on R end gaze, hypermetric saccades bilaterally  Muscle tone: WNL  Motor strength:  Normal strength except noted at 4/5 bilateral hip flexion  Sensory: Sensory exam in the upper and lower extremities is normal  Gait: Wide-based, ataxic gait.  Coordination: Slight ataxia with finger to nose bilaterally., heel to shin abnormal, RAM abnormal, finger tapping in crease of thumb abnormal, wide based stance with sway I do not feel safe to leave her without support, stands with feet together only with my support, stands on tandem for less than 2 seconds even with my support.  Reflexes: 2+ in BUE. 3+ in BLE with bilateral cross-adductors reflex present bilaterally. Equivocal right Plantars. Upgoing left Plantars. Negative Hoffmans.    Scale for the assessment and rating of ataxia (SARA)    GAIT  Walk at a safe distance parallel to a wall including a half turn (turn around to face the opposite direction of gait): Walking > 10 m only with strong support (two special sticks  or stroller or accompanying person)   Walk in tandem (heels to toes) without support: Walking > 10 m only with strong support (two special sticks or stroller or accompanying person)   Gait SCORE: 12     STANCE  Stand in natural position: Unable to stand for >10s even with constant support of one arm   Stand with feet together in parallel (big toes touching each other): Unable to stand for >10s even with constant support of one arm   Stand in tandem (both feet on one line, no space between heel and toe): Unable to stand for >10s even with constant support of one arm   Stance SCORE: 18     SITTING  Sitting (sit on examination bed without support of feet, eyes open and arms outstretched to  the front) SCORE: Normal, no difficulties sitting > 10s     SPEECH DISTURBANCE  Speech Disturbance SCORE: Normal     FINGER CHASE  Finger chase (right) SCORE: Dysmetria, under/ overshooting target <5 cm   Finger chase (left) SCORE: Dysmetria, under/ overshooting target <5 cm   Finger chase MEAN SCORE: 1     NOSE-FINGER TEST  Nose-finger test (right) SCORE: Tremor with an ampliture < 2 cm   Nose-finger test (left) SCORE: No tremor   Nose-finger MEAN SCORE: 0.5     ALTERNATING HAND MOVEMENTS  Fast alternating hand movements (right) SCORE: Slightly irregular (performs < 10s)   Fast alternating hand movements (left) SCORE: Slightly irregular (performs < 10s)   Fast alternating hand movements MEAN SCORE: 1     HEEL-SHIN SLIDE  Heel-shin slide (right) (perform 3 times) SCORE: Slightly abnormal, contact to shin maintained   Heel-shin slide (left) (perform 3 times) SCORE: Clearly abnormal, goes off shin up to 3 times during 3 cycles   Heel-shin slide MEAN SCORE: 1.5     SARA OVERALL TOTAL SCORE  SARA OVERALL TOTAL SCORE: 34         Current Medications:  acetaminophen (TYLENOL) tablet, 650 mg, Oral, Q4H PRN  acetaminophen (TYLENOL) tablet, 650 mg, Oral, Q24H  clonazePAM (klonoPIN) tablet, 1 mg, Oral, 2x/day PRN  diphenhydrAMINE (BENADRYL) capsule, 25 mg, Oral, Q24H  enoxaparin PF (LOVENOX) 40 mg/0.4 mL SubQ injection, 40 mg, Subcutaneous, Q24H  immune globulin (PRIVIGEN) 10% infusion, 400 mg/kg (Ideal), Intravenous, Q24H  promethazine (PHENERGAN) tablet, 12.5 mg, Oral, Q24H  sertraline (ZOLOFT) tablet, 50 mg, Oral, NIGHTLY        I/O:  I/O last 24 hours:    Intake/Output Summary (Last 24 hours) at 04/04/2022 0752  Last data filed at 04/04/2022 0500  Gross per 24 hour   Intake --   Output 1200 ml   Net -1200 ml     I/O current shift:  No intake/output data recorded.    Labs  Please indicate ordered or reviewed)  Reviewed: I have reviewed all lab results.    Review of reports and notes reveal:     Independent Interpretation of  images or specimens:  MRI brain w/wo contrast on 02/16/22: advanced cerebellar parenchymal volume loss    personal review of MRI brain form 2022, 6/23 and 01/2022 reveals severe cerebellar atrophy but also supratentorial , there is some mild degree worsening , in the 2022 and 11/2021  FLAIR there is hyperintensity in the pons that resemble hot cross bun sign (see below) as well as peduncular hyperintensities as well as in the rim of the pons            Patient/  Family Discussion: updated patient and husband at bedside    Assessment/Plan:  Active Hospital Problems    Diagnosis    Primary Problem: Weakness       [name] is a 51 y.o. 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 Patient has had progressive gait impairment, impaired spatial judgment and depth perception, cognitive decline, RBD, insomnia, dysphagia and mood changes since 2 years but more notably since January 2022. no family history or relevant toxic exposures. She also reports fatigable weakness and difficulty with speaking in full sentences without having to take a breath. On exam, she has truncal > appendicular >> ocular ataxia, dysarthria (per husband, I did not pick up on this), SARA score completed for future comparison. She has normal strength except for 4/5 hip flexors bilaterally. She has hyperreflexia in bilateral lower extremities. Patient had initially been direct admitted in August 2023 for MRI, LP, IVIG, and steroids, but she left the hospital after completing the MRI. MRI brain showed advanced cerebellar degeneration, on personal comparison to the one in 2022 there is slight progression, she also has supratentorial atrophy not expected for her age, hyperintensity in the pons (hot cross bun) and MCP. She reached out to Dr. Leonides Schanz on 10/10 asking for direct admission for IVIG due to recurrent issues with gait. 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 keep in mind the MRI findings are not pathognomonic or disease-specific  - Admitted to General Neurology service  - Neuro checks and vitals  - Pt declined LP as she was unable to tolerate it during her last admission. Offered flouro LP and she will think about it.  - Started IVIG 476m/kg x 5 days on 04/03/22  - Serum paraneoplastic panel and vitamin E was sent on 01/16/22 and was negative (results in media)  - Sent myasthenia gravis panel due to pt's subjective fatigable weakness  - Will send additional serum labs today, including GAD 65, Igl5, and serum MDS2 ([facility]), husband will share the lab results they have from outside facility first so we do not repeat resting   - PT/OT/SLP evaluations     Depression/Anxiety  - Continue Sertraline 572mnightly  - Continue Klonopin 64m25mID prn due to anxiety surrounding this hospitalization    DVT/PE Prophylaxis: Enoxaparin    MelSalvatore DecentO, 04/04/2022  PGY-3, Neurology    addendum added in red in pertinent note section    will do UPDR tomorrow to better assess for parkinsonism. other potential work up for reversible ataxia to be done only if not checked before:    -LFT's  -TSH/T4  -Vit B1  -Vit B6  -Vit B12  -Vit E  -Folate   -Serum copper  -Ceruloplasmin  -Zinc  -Anti-GAD65  -Anti-Gliadin  -SPEP and immunofixation  -ANA and ESR  -Anti-TPO  -RPR  -HIV  -Heavy metals  -Lipid panel  -CK  -Lactate and pyruvate   -  Iron studies       I saw and  examined the patient.  I reviewed the resident's note.  I agree with the findings and plan of care as documented in the resident's note.  Any exceptions/additions are edited/noted.    Delle Reining, MD

## 2022-04-04 NOTE — Care Plan (Signed)
[facility]  Occupational Therapy Initial Evaluation    Patient Name: [name]  Date of Birth: [birth year]  Height: Height: 165.1 cm (5' 5" )  Weight: Weight: 89.1 kg (196 lb 6.9 oz)  Room/Bed: 09/A  Payor: PEIA / Plan: PEIA/UMR / Product Type: Non Managed Care /     Assessment:   Pt tolerated OT evaluation well this date. Very motivated to participate however tearful d/t situation.  She reports she has had difficulties with activity tolerance, oftern requiring 30-40 minute rest breaks between activites at home.  She had completed showering prior to OT arrival and requiring significant fatigue.  She was able to manage seated UB ADLs with SUA however required Min AX2 for STS transfer and only tolerated standing 30 seconds.  She demonstrated decreased coordination and decreased depth perception noted with functional tasks.  Recommend continued skilled OT in inpatient rehab setting to increase safety and (I) with ADLs/IADLs/functional mobility when medically stable for d/c.  Will continue to follow in acute care setting.      Discharge Needs:   Equipment Recommendation: to be determined  Discharge Disposition: inpatient rehabilitation facility  The above recommendation is based upon the current examination and evaluation performed on this date. As subsequent sessions are completed, recommendations will be updated accordingly.  JUSTIFICATION OF DISCHARGE RECOMMENDATION   Based on current diagnosis, functional performance prior to admission, and current functional performance, this patient requires continued OT services in inpatient rehabilitation facility  in order to achieve significant functional improvements.    Plan:   Current Intervention: ADL retraining, balance training, bed mobility training, strengthening, vision retraining, therapeutic exercise, motor coordination training, fine motor coordination training, endurance training    To provide Occupational therapy services  1x/day, minimum of 2x/week, until discharge.       The risks/benefits of therapy have been discussed with the patient/caregiver and he/she is in agreement with the established plan of care.       Subjective & Objective        04/04/22 1206   Therapist Pager   OT Assigned/ Pager # [phone] [phone]   Rehab Session   Document Type evaluation   Total OT Minutes: 15   Patient Effort good   General Information   Patient Profile Reviewed yes   Onset of Illness/Injury or Date of Surgery 04/03/22   Pertinent History of Current Functional Problem [name] is a 51 y.o., White female who presents as a direct admit from Dr. 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 Patient reports progressive gait impairment since January 2022. She has been having issues with going up steps and has had to move her bedroom to the first floor. She has not been upstairs in her house in over 1 year. She reports 3 falls in the past year. Her most recent fall was yesterday. She tried to get up off of the sofa and fell onto the ground onto her right shoulder. She has been using a walker at home. She also reports progressive weakness in her arms and feels like she has to pause and take a break while brushing her hair. She states that she has issues with saying a full sentence and feels like she has to take a breath in between words. She denies diplopia and ptosis. She reports difficulty with swallowing for many years but this has not worsened in the past few years.   Respiratory Status room air   Existing Precautions/Restrictions full code;fall precautions  Pre Treatment Status   Pre Treatment Patient Status Patient supine in bed;Call light within reach;Telephone within reach   Support Present Pre Treatment  Family present   Communication Pre Treatment  Nurse   Communication Pre Treatment Comment RN approving OT evaluation, completed with professional assist of PT   Mutuality/Individual Preferences   Individualized Care Needs  OOB with sara stedy and Ax1-2, encourage participation with self care tasks   Living Environment   Lives With spouse   Living Arrangements house   Home Assessment: Stairs in [place] Accessibility stairs to enter home;stairs within home   [place] pt states she lives with her husband in 2 story home however has be staying on main level and has 2STE   Functional Level Prior   Ambulation 1 - assistive equipment   Transferring 1 - assistive equipment   Toileting 0 - independent   Bathing 1 - assistive equipment   Dressing 0 - independent   Prior Functional Level Comment pt states for past year she has been experiencing progressive weakness and 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  She uses walker or holds onto items in her home and has built in shower seat in shower.  She states she typically needs to take 30-40 minute breaks between activies and has not driven since June   Vital Signs   Vitals Comment no s/s of acute distress   Pain Assessment   Pre/Posttreatment Pain Comment pt report soreness in R shoulder from prior fall, did not formally rate   Cognitive Assessment/Interventions   Behavior/Mood Observations behavior appropriate to situation, WNL/WFL   Orientation Status oriented x 4   Attention WNL/WFL   Follows Commands WFL   Comment pt tearful d/t situation however very pleasant and cooperative   Vision Assessment/Interventions   Visual Impairment/Limitations   (pt reporting some issues with depth perception during functional tasks)   RUE Assessment   RUE Assessment X- Exceptions   RUE ROM WFL   RUE Strength shoudler 4/5; otherwise 5/5-recent history of fall with R shoulder injury   RUE Coordination fair FMC/GMC noted   LUE Assessment   LUE Assessment X-Exceptions   LUE ROM WFL   LUE Strength 5/5   LUE Coordination fair FMC/GMC noted   RLE Assessment   RLE Assessment X-Exceptions   LLE Assessment   LLE Assessment X-Exceptions   Mobility  Assessment/Training   Mobility Comment pt not able to tolerate functional ambulation at this time, had taken shower prior to arrival and was fatigued   Bed Mobility Assessment/Treatment   Supine-Sit Independence contact guard assist   Sit to Supine, Independence supervision required   Impairments balance impaired;endurance;coordination impaired;strength decreased   Transfer Assessment/Treatment   Sit-Stand Independence minimum assist (75% patient effort);2 person assist required;knee block;verbal cues required   Stand-Sit Independence contact guard assist;knee block;verbal cues required   Transfer Impairments balance impaired;endurance;coordination impaired;strength decreased   Transfer Comment pt required Min Ax2 to complete STS and maintain standing ~30 seconds before becoming fatigued and requiring seated rest break   ADL Assessment/Intervention   ADL Comments pt was managing hair grooming in bed upon arrival , she reports she showered prior to OT arrival as well causing significant fatigue and limiting functional ability at this time   Upper Body Dressing Assessment/Training   Position  sitting   DRESSING ASSESSED Don Shirt-button up   Independence Level  set up required  Comment donned jacket while sitting EOB   Balance Skill Training   Sitting Balance: Static good balance   Sitting, Dynamic (Balance) fair + balance   Sit-to-Stand Balance fair - balance   Standing Balance: Static fair - balance   Systems Impairment Contributing to Balance Disturbance musculoskeletal;neuromuscular   Post Treatment Status   Post Treatment Patient Status Patient supine in bed;Call light within reach;Telephone within reach   Support Present Post Treatment  Family present   Care Plan Goals   OT Rehab Goals Occupational Therapy Goal;Bed Mobility Goal;Grooming Goal;LB Dressing Goal;Toileting Goal;Transfer Training Goal 2;UB Dressing Goal   Occupational Therapy Goals   OT Goal, Date Established 04/04/22   OT Goal, Time to Achieve by  discharge   OT Goal, Activity Type pt to tolerate 10+ minutes of functional activity without rest break to increase safety and (I) with ADLs/IADLs   Bed Mobility Goal   Bed Mobility Goal, Date Established 04/04/22   Bed Mobility Goal, Time to Achieve by discharge   Bed Mobility Goal, Activity Type all bed mobility activities   Bed Mobility Goal, Independence Level modified independence   Grooming Goal   Grooming Goal, Date Established 04/04/22   Grooming Goal, Time to Achieve by discharge   Grooming Goal, Activity Type all grooming tasks   Grooming Goal, Independence  modified independence   LB Dressing Goal   LB Dressing Goal, Date Established 04/04/22   LB Dressing Goal, Time to Achieve by discharge   LB Dressing Goal, Activity Type all lower body dressing tasks   LB Dressing Goal, Independence Level modified independence   Toileting Goal   Toileting Goal, Date Established 04/04/22   Toileting Goal, Time to Achieve by discharge   Toileting Goal, Activity Type all toileting tasks   Toileting Goal, Independence Level modified independence   Transfer Training Goal 2   Transfer Training Goal, Date Established 04/04/22   Transfer Training Goal, Time to Achieve by discharge   Transfer Training Goal, Activity Type all transfers   Transfer Training Goal, Independence Level modified independence   UB Dressing Goal   UB Dressing  Goal, Date Established 04/04/22   UB Dressing Goal, Time to Achieve by discharge   UB Dressing Goal, Activity Type all upper body dressing tasks   UB Dressing Goal, Independence Level modified independence   Planned Therapy Interventions, OT Eval   Planned Therapy Interventions ADL retraining;balance training;bed mobility training;strengthening;vision retraining;therapeutic exercise;motor coordination training;fine motor coordination training;endurance training   Clinical Impression   Functional Level at Time of Session Pt tolerated OT evaluation well this date. Very motivated to participate  however tearful d/t situation.  She reports she has had difficulties with activity tolerance, oftern requiring 30-40 minute rest breaks between activites at home.  She had completed showering prior to OT arrival and requiring significant fatigue.  She was able to manage seated UB ADLs with SUA however required Min AX2 for STS transfer and only tolerated standing 30 seconds.  She demonstrated decreased coordination and decreased depth perception noted with functional tasks.  Recommend continued skilled OT in inpatient rehab setting to increase safety and (I) with ADLs/IADLs/functional mobility when medically stable for d/c.  Will continue to follow in acute care setting.   Criteria for Skilled Therapeutic Interventions Met (OT) yes;meets criteria;skilled treatment is necessary   Rehab Potential good   Therapy Frequency 1x/day;minimum of 2x/week   Predicted Duration of Therapy until discharge   Anticipated Equipment Needs at Discharge to be determined   Anticipated Discharge Disposition  inpatient rehabilitation facility   Highest level of Mobility score   Exercise/Activity Level Performed 5- Static standing>1 minute   Evaluation Complexity Justification   Occupational Profile Review Expanded review   Performance Deficits Mobility;Balance;Endurance;Range of motion;Strength;Coordination;Vision;5+ deficits   Clinical Decision Making Moderate analytic complexity   Evaluation Complexity Moderate       Therapist:   Lollie Marrow, OT   Pager #: [phone]

## 2022-04-05 DIAGNOSIS — R3915 Urgency of urination: Secondary | ICD-10-CM

## 2022-04-05 LAB — IRON TRANSFERRIN AND TIBC
IRON (TRANSFERRIN) SATURATION: 16 % (ref 15–50)
IRON: 62 ug/dL (ref 45–170)
TOTAL IRON BINDING CAPACITY: 399 ug/dL (ref 252–504)
TRANSFERRIN: 285 mg/dL (ref 180–360)

## 2022-04-05 LAB — LAVENDER TOP TUBE

## 2022-04-05 LAB — FERRITIN: FERRITIN: 57 ng/mL (ref 5–200)

## 2022-04-05 LAB — HEPATIC FUNCTION PANEL
ALBUMIN: 3.9 g/dL (ref 3.5–5.0)
ALKALINE PHOSPHATASE: 93 U/L (ref 50–130)
ALT (SGPT): 47 U/L — ABNORMAL HIGH (ref 8–22)
AST (SGOT): 37 U/L (ref 8–45)
BILIRUBIN DIRECT: 0.2 mg/dL (ref 0.1–0.4)
BILIRUBIN TOTAL: 0.8 mg/dL (ref 0.3–1.3)
PROTEIN TOTAL: 8.6 g/dL — ABNORMAL HIGH (ref 6.4–8.3)

## 2022-04-05 LAB — CERULOPLASMIN: CERULOPLASMIN: 30 mg/dL (ref 18–45)

## 2022-04-05 LAB — LIPID PANEL
CHOL/HDL RATIO: 4.6
CHOLESTEROL: 184 mg/dL (ref 100–200)
HDL CHOL: 40 mg/dL — ABNORMAL LOW (ref >=50)
LDL CALC: 99 mg/dL (ref <100)
NON-HDL: 144 mg/dL (ref <=190)
TRIGLYCERIDES: 263 mg/dL — ABNORMAL HIGH (ref <150)
VLDL CALC: 43 mg/dL — ABNORMAL HIGH (ref <30)

## 2022-04-05 LAB — FOLATE: FOLATE: 6 ng/mL — ABNORMAL LOW (ref 7.0–31.0)

## 2022-04-05 LAB — ALBUMIN FOR ELECTROPHORESIS: ALBUMIN: 3.9 g/dL (ref 3.5–5.0)

## 2022-04-05 LAB — VITAMIN B12: VITAMIN B 12: 505 pg/mL (ref 200–900)

## 2022-04-05 LAB — LACTIC ACID LEVEL W/ REFLEX FOR LEVEL >2.0: LACTIC ACID: 1.1 mmol/L (ref 0.5–2.2)

## 2022-04-05 LAB — THYROID STIMULATING HORMONE WITH FREE T4 REFLEX: TSH: 1.195 u[IU]/mL (ref 0.350–4.940)

## 2022-04-05 LAB — SEDIMENTATION RATE: ERYTHROCYTE SEDIMENTATION RATE (ESR): 10 mm/hr (ref 0–20)

## 2022-04-05 LAB — PROTEIN FOR ELECTROPHORESIS: PROTEIN TOTAL: 8.4 g/dL — ABNORMAL HIGH (ref 6.0–7.9)

## 2022-04-05 MED ORDER — IMMUNE GLOB,GAMM(IGG) 10 %-PRO-IGA 0 TO 50 MCG/ML INTRAVENOUS SOLUTION
400.0000 mg/kg | INTRAVENOUS | Status: DC
Start: 2022-04-05 — End: 2022-04-06
  Administered 2022-04-05: 25000 mg via INTRAVENOUS
  Administered 2022-04-06: 0 mg via INTRAVENOUS
  Filled 2022-04-05: qty 250

## 2022-04-05 MED ORDER — ACETAMINOPHEN 325 MG TABLET
650.0000 mg | ORAL_TABLET | ORAL | Status: DC
Start: 2022-04-05 — End: 2022-04-06
  Administered 2022-04-05: 650 mg via ORAL
  Filled 2022-04-05: qty 2

## 2022-04-05 MED ORDER — PROMETHAZINE 25 MG TABLET
12.5000 mg | ORAL_TABLET | ORAL | Status: DC
Start: 2022-04-05 — End: 2022-04-06
  Administered 2022-04-05: 12.5 mg via ORAL
  Filled 2022-04-05: qty 1

## 2022-04-05 MED ORDER — DIPHENHYDRAMINE 25 MG CAPSULE
25.0000 mg | ORAL_CAPSULE | ORAL | Status: DC
Start: 2022-04-05 — End: 2022-04-06
  Administered 2022-04-05: 25 mg via ORAL
  Filled 2022-04-05: qty 1

## 2022-04-05 NOTE — Progress Notes (Signed)
[facility]  Neurology Progress Note      Lindsey, Cross, 51 y.o. female  Date of Admission:  04/03/2022  Date of service: 04/05/2022  Date of Birth:  Jan 21, 1971      Chief Complaint: gait impairment  Pt's condition today: stable    Subjective: Patient reports that she feels like she is having an easier time sitting up. She is tolerating IVIG well. Daughter showed me labs that were previously completed on her phone and the document showed the paraneoplastic panel and vitamin e level that are already uploaded in media. Pt denies constipation and stiffness. She reports urinary urgency and lightheadedness with position changes. She reports some slowness, but unclear if this is due to her being careful to prevent falls.    Vital Signs:  Temp (24hrs) Max:36.9 C (61.5 F)      Systolic (37HKF), EXM:147 , Min:102 , WLK:957     Diastolic (47BUY), ZJQ:96, Min:57, Max:78    Temp  Avg: 36.7 C (98.1 F)  Min: 36.6 C (97.9 F)  Max: 36.9 C (98.5 F)  MAP (Non-Invasive)  Avg: 81.5 mmHG  Min: 73 mmHG  Max: 93 mmHG  Pulse  Avg: 80.3  Min: 64  Max: 95  Resp  Avg: 17.1  Min: 15  Max: 18  SpO2  Avg: 97.4 %  Min: 96 %  Max: 98 %       Today's Physical Exam:  General:alert  Mental status:Alert and oriented x 3  Memory: Memory is normal to conversation. Follows commands.  Attention: Attention and concentration are normal to conversation.  Knowledge: Appropriate.  Language and Speech: No aphasia. No dysarthria.  Cranial nerves: Cranial nerves 2-12 are normal rotational nystagmus on R end gaze, hypermetric saccades bilaterally  Muscle tone: WNL  Motor strength:  Normal strength except noted at 4/5 bilateral hip flexion  Sensory: Sensory exam in the upper and lower extremities is normal  Gait: Wide-based, ataxic gait.  Coordination: Slight ataxia with finger to nose bilaterally., heel to shin abnormal, RAM abnormal, finger tapping in crease of thumb abnormal, wide based stance with sway I do not feel safe to leave her  without support, stands with feet together only with my support, stands on tandem for less than 2 seconds even with my support.  Reflexes: 2+ in BUE. 3+ in BLE with bilateral cross-adductors reflex present bilaterally. Equivocal right Plantars. Upgoing left Plantars. Negative Hoffmans.    Scale for the assessment and rating of ataxia (SARA) on 04/04/22  GAIT  Walk at a safe distance parallel to a wall including a half turn (turn around to face the opposite direction of gait): Walking > 10 m only with strong support (two special sticks or stroller or accompanying person)   Walk in tandem (heels to toes) without support: Walking > 10 m only with strong support (two special sticks or stroller or accompanying person)   Gait SCORE: 12     STANCE  Stand in natural position: Unable to stand for >10s even with constant support of one arm   Stand with feet together in parallel (big toes touching each other): Unable to stand for >10s even with constant support of one arm   Stand in tandem (both feet on one line, no space between heel and toe): Unable to stand for >10s even with constant support of one arm   Stance SCORE: 18     SITTING  Sitting (sit on examination bed without support of feet, eyes open and arms  outstretched to the front) SCORE: Normal, no difficulties sitting > 10s     SPEECH DISTURBANCE  Speech Disturbance SCORE: Normal     FINGER CHASE  Finger chase (right) SCORE: Dysmetria, under/ overshooting target <5 cm   Finger chase (left) SCORE: Dysmetria, under/ overshooting target <5 cm   Finger chase MEAN SCORE: 1     NOSE-FINGER TEST  Nose-finger test (right) SCORE: Tremor with an ampliture < 2 cm   Nose-finger test (left) SCORE: No tremor   Nose-finger MEAN SCORE: 0.5     ALTERNATING HAND MOVEMENTS  Fast alternating hand movements (right) SCORE: Slightly irregular (performs < 10s)   Fast alternating hand movements (left) SCORE: Slightly irregular (performs < 10s)   Fast alternating hand movements MEAN SCORE: 1      HEEL-SHIN SLIDE  Heel-shin slide (right) (perform 3 times) SCORE: Slightly abnormal, contact to shin maintained   Heel-shin slide (left) (perform 3 times) SCORE: Clearly abnormal, goes off shin up to 3 times during 3 cycles   Heel-shin slide MEAN SCORE: 1.5     SARA OVERALL TOTAL SCORE  SARA OVERALL TOTAL SCORE: 34     UPDRS on 04/05/22:  Speech  off Meds: Normal  Facial Expression off Meds: Normal  Face Tremor at Rest off Meds: Absent  Left Hand Tremor at Rest off Meds: Absent  Right Hand Tremor at Rest off Meds: Absent  Left Foot  Tremor at Rest off Meds: Absent  Right Foot  Tremor at Rest off Meds: Absent  Action or Postural Tremor of Left Hand: Absent  Action or Postural Tremor of Right Hand: Absent  Neck Rigidity off Meds: Absent  Left Upper Extremity Rigidity off Meds: Absent  Right Upper Extremity Rigidity off Meds: Slight or detectable only when activated by mirror or other movements  Left  Lower  Extremity Rigidity off Meds: Absent  Right  Lower  Extremity Rigidity off Meds: Absent  Finger Taps Left Hand: Normal  Finger Taps Right Hand: Mild slowing and/or reduction in amplitude  Left Hand Movements: Normal  Right Hand Movements: Mild slowing and/or reduction in amplitude  Rapid Alternating Movements of Left Hand: Normal  Rapid Alternating movements of Right Hand: Normal  Left Leg Agility: Normal  Right Leg Agility: Normal  Arising from Chair: Unable to arise without help  Posture: Normal erect  Gait: Severe disturbance of gait, requiring assistance  Postural Stability: Very unstable, tends to lose balance spontaneously  Body Bradykinesia and Hypokinesia: None   Total: 13            Current Medications:  acetaminophen (TYLENOL) tablet, 650 mg, Oral, Q4H PRN  acetaminophen (TYLENOL) tablet, 650 mg, Oral, Q24H  clonazePAM (klonoPIN) tablet, 1 mg, Oral, 2x/day PRN  diphenhydrAMINE (BENADRYL) capsule, 25 mg, Oral, Q24H  enoxaparin PF (LOVENOX) 40 mg/0.4 mL SubQ injection, 40 mg, Subcutaneous, Q24H  immune  globulin (PRIVIGEN) 10% infusion, 400 mg/kg (Ideal), Intravenous, Q24H  promethazine (PHENERGAN) tablet, 12.5 mg, Oral, Q24H  sertraline (ZOLOFT) tablet, 50 mg, Oral, NIGHTLY        I/O:  I/O last 24 hours:    Intake/Output Summary (Last 24 hours) at 04/05/2022 0704  Last data filed at 04/05/2022 0500  Gross per 24 hour   Intake 920 ml   Output --   Net 920 ml     I/O current shift:  No intake/output data recorded.    Labs  Please indicate ordered or reviewed)  Reviewed: I have reviewed all lab results.    Review  of reports and notes reveal:     Independent Interpretation of images or specimens:  MRI brain w/wo contrast on 02/16/22: advanced cerebellar parenchymal volume loss    personal review of MRI brain form 2022, 6/23 and 01/2022 reveals severe cerebellar atrophy but also supratentorial , there is some mild degree worsening , in the 2022 and 11/2021  FLAIR there is hyperintensity in the pons that resemble hot cross bun sign (see below) as well as peduncular hyperintensities as well as in the rim of the pons            Patient/ Family Discussion: updated patient and husband at bedside    Assessment/Plan:  [facility] Problems    Diagnosis    Primary Problem: Autoimmune cerebellar degeneration ([facility] [place])       DEAJAH ERKKILA is a 51 y.o. 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 Patient has had progressive gait impairment, impaired spatial judgment and depth perception, cognitive decline, RBD, insomnia, dysphagia and mood changes since 2 years but more notably since January 2022. no family history or relevant toxic exposures. She also reports fatigable weakness and difficulty with speaking in full sentences without having to take a breath. On exam, she has truncal > appendicular >> ocular ataxia, dysarthria (per husband, I did not pick up on this), SARA score completed for future comparison. She has normal strength except for 4/5 hip flexors  bilaterally. She has hyperreflexia in bilateral lower extremities. Patient had initially been direct admitted in August 2023 for MRI, LP, IVIG, and steroids, but she left the hospital after completing the MRI. MRI brain showed advanced cerebellar degeneration, on personal comparison to the one in 2022 there is slight progression, she also has supratentorial atrophy not expected for her age, hyperintensity in the pons (hot cross bun) and MCP. She reached out to Dr. Leonides Schanz on 10/10 asking for direct admission for IVIG due to recurrent issues with gait. 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 keep in mind the MRI findings are not pathognomonic or disease-specific  - Admitted to General Neurology service  - Neuro checks and vitals  - Pt declined LP as she was unable to tolerate it during her last admission. Offered flouro LP and she will think about it.  - Started IVIG 416m/kg x 5 days on 04/03/22  - Serum paraneoplastic panel and vitamin E was sent on 01/16/22 and was negative (results in media)  - Sent myasthenia gravis panel due to pt's subjective fatigable weakness  - Serum MDS2 sent ([facility])  - Will send LFTs, TSH/T4, Vit B1, Vit B6, Vit B12, Vit E, Folate, copper, ceruloplasmin, zinc, anti-GAD65, anti-gliadin, SPEP, ANA, ESR, anti-TPO, RPR, HIV, heavy metals, lipid panel, CK, lactate, pyruvate, iron studies  - Patient is declining testing for HIV, syphilis, and heavy  metals. Discontinued these labs.  -Durene CalMFloris([facility]) on 04/04/22  - Family showed  me labs on their phone that were completed, and it was the paraneoplastic panel and vitamin E level that has already been uploaded to media.  - PT/OT/SLP evaluations  - Will obtain orthostatic vital signs today    Restless Legs Syndrome  - Will check ferritin. If ferritin is less than 75, then will start iron supplementation  - If ferritin is normal, or if RLS persists despite iron supplementation, then will start Gabapentin     Depression/Anxiety  - Continue Sertraline 79m nightly  - Continue Klonopin 185mBID prn due to anxiety surrounding this hospitalization    DVT/PE Prophylaxis: Enoxaparin    MeSalvatore DecentDO, 04/05/2022  PGY-3, Neurology    check orthostatic vitals, unclear if her feeling when getting up is lightheadedness or just instability from her ataxia.   performed updrs today, does not have overt parkinsonism, the RUE increased tone could be paratonia and ataxia can disrupt repetitive movements. does not seem to have overt dysautonomia symptoms either with the exception of urinary urgency. At this time does not meet criteria for probable MSA. can consider DAT scan in the future if there is diagnostic doubt (clinical parkinsonism may be absent in early MSA-C)    I saw and examined the patient.  I reviewed the resident's note.  I agree with the findings and plan of care as documented in the resident's note.  Any exceptions/additions are edited/noted.    MaDelle ReiningMD

## 2022-04-06 ENCOUNTER — Ambulatory Visit (INDEPENDENT_AMBULATORY_CARE_PROVIDER_SITE_OTHER): Payer: Self-pay | Admitting: NEUROLOGY

## 2022-04-06 DIAGNOSIS — R471 Dysarthria and anarthria: Secondary | ICD-10-CM

## 2022-04-06 DIAGNOSIS — G319 Degenerative disease of nervous system, unspecified: Secondary | ICD-10-CM

## 2022-04-06 DIAGNOSIS — G2581 Restless legs syndrome: Secondary | ICD-10-CM

## 2022-04-06 DIAGNOSIS — F419 Anxiety disorder, unspecified: Secondary | ICD-10-CM

## 2022-04-06 DIAGNOSIS — R292 Abnormal reflex: Secondary | ICD-10-CM

## 2022-04-06 DIAGNOSIS — R531 Weakness: Secondary | ICD-10-CM

## 2022-04-06 DIAGNOSIS — F32A Depression, unspecified: Secondary | ICD-10-CM

## 2022-04-06 DIAGNOSIS — R278 Other lack of coordination: Secondary | ICD-10-CM

## 2022-04-06 LAB — GLIADIN (DEAMIDATED) ANTIBODY, IGG, SERUM
GLIADIN (DEAMIDATED) ANTIBODY IGG QUALITATIVE: NEGATIVE
GLIADIN (DEAMIDATED) ANTIBODY IGG QUANTITATIVE: 1.9 U/mL (ref <15.0)

## 2022-04-06 LAB — GLIADIN (DEAMIDATED) ANTIBODIES, IGA, SERUM
GLIADIN (DEAMIDATED) ANTIBODY IGA QUALITATIVE: NEGATIVE
GLIADIN (DEAMIDATED) ANTIBODY IGA QUANTITATIVE: 0.9 U/mL (ref <15.0)

## 2022-04-06 LAB — IMMUNOGLOBULIN A (IGA), SERUM: IMMUNOGLOBULIN A (IGA): 346 mg/dL (ref 85–499)

## 2022-04-06 LAB — THYROPEROXIDASE (TPO) ANTIBODIES, SERUM: ANTI THYROPEROXIDASE ANTIBODIES: 46 IU/mL — ABNORMAL HIGH (ref <=33)

## 2022-04-06 MED ORDER — ACETAMINOPHEN 325 MG TABLET
650.0000 mg | ORAL_TABLET | ORAL | Status: DC
Start: 2022-04-06 — End: 2022-04-07
  Administered 2022-04-06: 650 mg via ORAL
  Filled 2022-04-06: qty 2

## 2022-04-06 MED ORDER — FOLIC ACID 1 MG TABLET
1.0000 mg | ORAL_TABLET | Freq: Every day | ORAL | Status: DC
Start: 2022-04-06 — End: 2022-04-07
  Administered 2022-04-06 – 2022-04-07 (×2): 1 mg via ORAL
  Filled 2022-04-06 (×2): qty 1

## 2022-04-06 MED ORDER — FERROUS SULFATE 324 MG (65 MG IRON) TABLET,DELAYED RELEASE
324.0000 mg | DELAYED_RELEASE_TABLET | ORAL | Status: DC
Start: 2022-04-07 — End: 2022-04-07
  Administered 2022-04-07: 324 mg via ORAL
  Filled 2022-04-06 (×2): qty 1

## 2022-04-06 MED ORDER — IMMUNE GLOB,GAMM(IGG) 10 %-PRO-IGA 0 TO 50 MCG/ML INTRAVENOUS SOLUTION
400.0000 mg/kg | INTRAVENOUS | Status: DC
Start: 2022-04-06 — End: 2022-04-07
  Administered 2022-04-06: 25000 mg via INTRAVENOUS
  Administered 2022-04-06 – 2022-04-07 (×2): 0 mg via INTRAVENOUS
  Filled 2022-04-06 (×2): qty 250

## 2022-04-06 MED ORDER — PROMETHAZINE 25 MG TABLET
12.5000 mg | ORAL_TABLET | ORAL | Status: DC
Start: 2022-04-06 — End: 2022-04-07
  Administered 2022-04-06: 12.5 mg via ORAL
  Filled 2022-04-06: qty 1

## 2022-04-06 MED ORDER — HYALURONIDASE, HUMAN RECOMBINANT 150 UNIT/ML INJECTION -EXTRAVASATION
1.0000 mL | Freq: Once | INTRAMUSCULAR | Status: AC
Start: 2022-04-07 — End: 2022-04-07
  Administered 2022-04-07: 150 [IU] via SUBCUTANEOUS
  Filled 2022-04-06: qty 1

## 2022-04-06 MED ORDER — DIPHENHYDRAMINE 25 MG CAPSULE
25.0000 mg | ORAL_CAPSULE | ORAL | Status: DC
Start: 2022-04-06 — End: 2022-04-07
  Administered 2022-04-06: 25 mg via ORAL
  Filled 2022-04-06: qty 1

## 2022-04-06 NOTE — Progress Notes (Signed)
[facility]  Neurology Progress Note      [name], [name], 51 y.o. female  Date of Admission:  04/03/2022  Date of service: 04/06/2022  Date of Birth:  04-24-1971      Chief Complaint: gait impairment  Pt's condition today: stable    Subjective: Patient slept well and is tolerating the IVIG without issues.     Vital Signs:  Temp (24hrs) Max:37 C (93.9 F)      Systolic (03ESP), QZR:007 , Min:94 , MAU:633     Diastolic (35KTG), YBW:38, Min:55, Max:96    Temp  Avg: 36.8 C (98.2 F)  Min: 36.6 C (97.9 F)  Max: 37 C (98.6 F)  MAP (Non-Invasive)  Avg: 84 mmHG  Min: 73 mmHG  Max: 102 mmHG  Pulse  Avg: 82.9  Min: 68  Max: 103  Resp  Avg: 18  Min: 18  Max: 18  SpO2  Avg: 96.7 %  Min: 96 %  Max: 97 %       Today's Physical Exam:  General:alert  Mental status:Alert and oriented x 3  Memory: Memory is normal to conversation. Follows commands.  Attention: Attention and concentration are normal to conversation.  Knowledge: Appropriate.  Language and Speech: No aphasia. No dysarthria.  Cranial nerves: Cranial nerves 2-12 are normal rotational nystagmus on R end gaze, hypermetric saccades bilaterally  Muscle tone: WNL except some increased tone in RUE  Motor strength:  Normal strength except noted at 4/5 bilateral hip flexion  Sensory: Sensory exam in the upper and lower extremities is normal  Gait: Wide-based, ataxic gait.  Coordination: Slight ataxia with finger to nose bilaterally, heel to shin abnormal, RAM abnormal, finger tapping in crease of thumb abnormal, wide based stance with sway I do not feel safe to leave her without support, stands with feet together only with my support, stands on tandem for less than 2 seconds even with my support.  Reflexes: 2+ in BUE. 3+ in BLE with bilateral cross-adductors reflex present bilaterally. Equivocal right Plantars. Upgoing left Plantars. Negative Hoffmans.    Scale for the assessment and rating of ataxia (SARA) on 04/04/22  GAIT  Walk at a safe distance parallel to  a wall including a half turn (turn around to face the opposite direction of gait): Walking > 10 m only with strong support (two special sticks or stroller or accompanying person)   Walk in tandem (heels to toes) without support: Walking > 10 m only with strong support (two special sticks or stroller or accompanying person)   Gait SCORE: 12     STANCE  Stand in natural position: Unable to stand for >10s even with constant support of one arm   Stand with feet together in parallel (big toes touching each other): Unable to stand for >10s even with constant support of one arm   Stand in tandem (both feet on one line, no space between heel and toe): Unable to stand for >10s even with constant support of one arm   Stance SCORE: 18     SITTING  Sitting (sit on examination bed without support of feet, eyes open and arms outstretched to the front) SCORE: Normal, no difficulties sitting > 10s     SPEECH DISTURBANCE  Speech Disturbance SCORE: Normal     FINGER CHASE  Finger chase (right) SCORE: Dysmetria, under/ overshooting target <5 cm   Finger chase (left) SCORE: Dysmetria, under/ overshooting target <5 cm   Finger chase MEAN SCORE: 1  NOSE-FINGER TEST  Nose-finger test (right) SCORE: Tremor with an ampliture < 2 cm   Nose-finger test (left) SCORE: No tremor   Nose-finger MEAN SCORE: 0.5     ALTERNATING HAND MOVEMENTS  Fast alternating hand movements (right) SCORE: Slightly irregular (performs < 10s)   Fast alternating hand movements (left) SCORE: Slightly irregular (performs < 10s)   Fast alternating hand movements MEAN SCORE: 1     HEEL-SHIN SLIDE  Heel-shin slide (right) (perform 3 times) SCORE: Slightly abnormal, contact to shin maintained   Heel-shin slide (left) (perform 3 times) SCORE: Clearly abnormal, goes off shin up to 3 times during 3 cycles   Heel-shin slide MEAN SCORE: 1.5     SARA OVERALL TOTAL SCORE  SARA OVERALL TOTAL SCORE: 34     UPDRS on 04/05/22:  Speech  off Meds: Normal  Facial Expression off Meds:  Normal  Face Tremor at Rest off Meds: Absent  Left Hand Tremor at Rest off Meds: Absent  Right Hand Tremor at Rest off Meds: Absent  Left Foot  Tremor at Rest off Meds: Absent  Right Foot  Tremor at Rest off Meds: Absent  Action or Postural Tremor of Left Hand: Absent  Action or Postural Tremor of Right Hand: Absent  Neck Rigidity off Meds: Absent  Left Upper Extremity Rigidity off Meds: Absent  Right Upper Extremity Rigidity off Meds: Slight or detectable only when activated by mirror or other movements  Left  Lower  Extremity Rigidity off Meds: Absent  Right  Lower  Extremity Rigidity off Meds: Absent  Finger Taps Left Hand: Normal  Finger Taps Right Hand: Mild slowing and/or reduction in amplitude  Left Hand Movements: Normal  Right Hand Movements: Mild slowing and/or reduction in amplitude  Rapid Alternating Movements of Left Hand: Normal  Rapid Alternating movements of Right Hand: Normal  Left Leg Agility: Normal  Right Leg Agility: Normal  Arising from Chair: Unable to arise without help  Posture: Normal erect  Gait: Severe disturbance of gait, requiring assistance  Postural Stability: Very unstable, tends to lose balance spontaneously  Body Bradykinesia and Hypokinesia: None   Total: 13            Current Medications:  acetaminophen (TYLENOL) tablet, 650 mg, Oral, Q4H PRN  acetaminophen (TYLENOL) tablet, 650 mg, Oral, Q24H  clonazePAM (klonoPIN) tablet, 1 mg, Oral, 2x/day PRN  diphenhydrAMINE (BENADRYL) capsule, 25 mg, Oral, Q24H  enoxaparin PF (LOVENOX) 40 mg/0.4 mL SubQ injection, 40 mg, Subcutaneous, Q24H  immune globulin (PRIVIGEN) 10% infusion, 400 mg/kg (Ideal), Intravenous, Q24H  promethazine (PHENERGAN) tablet, 12.5 mg, Oral, Q24H  sertraline (ZOLOFT) tablet, 50 mg, Oral, NIGHTLY        I/O:  I/O last 24 hours:    Intake/Output Summary (Last 24 hours) at 04/06/2022 0658  Last data filed at 04/06/2022 0500  Gross per 24 hour   Intake 1290 ml   Output --   Net 1290 ml     I/O current shift:  10/15 1900  - 10/16 0659  In: 570 [P.O.:570]  Out: -     Labs  Please indicate ordered or reviewed)  Reviewed: I have reviewed all lab results.    Review of reports and notes reveal:     Independent Interpretation of images or specimens:  MRI brain w/wo contrast on 02/16/22: advanced cerebellar parenchymal volume loss    personal review of MRI brain form 2022, 6/23 and 01/2022 reveals severe cerebellar atrophy but also supratentorial , there is some mild degree  worsening , in the 2022 and 11/2021  FLAIR there is hyperintensity in the pons that resemble hot cross bun sign (see below) as well as peduncular hyperintensities as well as in the rim of the pons            Patient/ Family Discussion: updated patient and husband at bedside    Assessment/Plan:  [facility] Problems    Diagnosis    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 Patient has had progressive gait impairment, impaired spatial judgment and depth perception, cognitive decline, RBD, insomnia, dysphagia and mood changes since 2 years but more notably since January 2022. no family history or relevant toxic exposures. She also reports fatigable weakness and difficulty with speaking in full sentences without having to take a breath. On exam, she has truncal > appendicular >> ocular ataxia, dysarthria (per husband, I did not pick up on this), SARA score completed for future comparison. She has normal strength except for 4/5 hip flexors bilaterally. She has hyperreflexia in bilateral lower extremities. Patient had initially been direct admitted in August 2023 for MRI, LP, IVIG, and steroids, but she left the hospital after completing the MRI. MRI brain showed advanced cerebellar degeneration, on personal comparison to the one in 2022 there is slight progression, she also has supratentorial atrophy not  expected for her age, hyperintensity in the pons (hot cross bun) and MCP. She reached out to Dr. Leonides Schanz on 10/10 asking for direct admission for IVIG due to recurrent issues with gait. 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 keep in mind the MRI findings are not pathognomonic or disease-specific  - Admitted to General Neurology service  - Neuro checks and vitals  - Pt declined LP as she was unable to tolerate it during her last admission. Offered flouro LP and she continues to decline during this admission.  - Started IVIG 445m/kg x 5 days on 04/03/22  - Serum paraneoplastic panel and vitamin E was sent on 01/16/22 and was negative (results in media)  - Sent myasthenia gravis panel due to pt's subjective fatigable weakness (pending)  - Serum MDS2 sent ([facility], pending)  - Sent LFTs, TSH/T4, Vit B1, Vit B6, Vit B12, Folate, copper, ceruloplasmin, zinc, anti-GAD65, anti-gliadin, SPEP, ANA, ESR, anti-TPO, lactate, pyruvate, iron studies  - Patient declined testing for HIV, syphilis, and heavy metals. Discontinued these labs.  - Family showed me labs on their phone that were completed, and it was the paraneoplastic panel and vitamin E level that has already been uploaded to media.  - PT/OT/SLP evaluations  - Orthostatic vitals on 10/15: supine 136/55, sitting 94/81, standing 120/83    Restless Legs Syndrome  - Ferritin 57. Will start iron supplementation.  - If RLS persists  despite iron supplementation, then will start Gabapentin  Depression/Anxiety  - Continue Sertraline 80m nightly  - Continue Klonopin 142mBID prn due to anxiety surrounding this hospitalization    DVT/PE Prophylaxis: Enoxaparin    MeSalvatore DecentDO, 04/06/2022  PGY-3, Neurology      I saw and examined the patient.  I reviewed the resident's note.  I agree with the findings and plan of care as documented in the resident's note.  Any exceptions/additions are edited/noted.    UmBasil DessMD

## 2022-04-06 NOTE — Care Plan (Signed)
[place]  Occupational Therapy Progress Note    Patient Name: [name]  Date of Birth: 12/05/1970  Height:  165.1 cm (5' 5" )  Weight:  89.1 kg (196 lb 6.9 oz)  Room/Bed: 09/A  Payor: /     Assessment:    Patient tolerates session well this date.  Patient reports improvement in bilateral LE strength with IVIG treatments and able to complete household distance safely this date.  Patient safe for home DC with intermittent assistance, OP PT services, and shower chair.  Will continue to follow.      Discharge Needs:   Equipment Recommendation: shower chair    The patient presents with mobility limitations due to impaired balance and impaired functional activity tolerance that significantly impair/prevent patient's ability to participate in mobility-related activities of daily living (MRADLs) including  bathing. This functional mobility deficit can be sufficiently resolved with the use of a shower chair   in order to decrease the risk of falls, morbidity, and mortality in performance of these MRADLs.  Patient is able to safely use this assistive device.    Discharge Disposition:  home with assist, home with outpatient services     JUSTIFICATION OF DISCHARGE RECOMMENDATION   Based on current diagnosis, functional performance prior to admission, and current functional performance, this patient requires continued OT services in home with assist, home with outpatient services in order to achieve significant functional improvements.    Plan:   Continue to follow patient according to established plan of care.  The risks/benefits of therapy have been discussed with the patient/caregiver and he/she is in agreement with the established plan of care.     Subjective & Objective:      04/06/22 6213   Therapist Pager   OT Assigned/ Pager # [phone]   Rehab Session   Document Type therapy progress note (daily note)   Total OT Minutes: 14   Patient Effort good   Symptoms Noted  During/After Treatment none   General Information   Patient Profile Reviewed yes   Pre Treatment Status   Pre Treatment Patient Status Patient supine in bed;Call light within reach;Patient safety alarm activated;Nurse approved session   Support Present Pre Treatment  None   Communication Pre Treatment  Nurse   Mutuality/Individual Preferences   Individualized Care Needs OOB with FWW assist of 1   Coping/Psychosocial Response Interventions   Plan Of Care Reviewed With patient   Cognitive Assessment/Interventions   Behavior/Mood Observations behavior appropriate to situation, WNL/WFL   Orientation Status oriented x 4   Attention WNL/WFL   Follows Commands WNL   Bed Mobility Assessment/Treatment   Bed Mobility, Assistive Device bed rails;Head of Bed Elevated   Supine-Sit Independence supervision required   Safety Issues decreased use of legs for bridging/pushing   Impairments balance impaired;coordination impaired;endurance;strength decreased   Transfer Assessment/Treatment   Sit-Stand Independence contact guard assist   Stand-Sit Independence contact guard assist   Sit-Stand-Sit, Assist Device walker, front wheeled   Toilet Transfer Independence contact guard assist   Toilet Transfer Assist Device walker, front wheeled   Lower Body Dressing Assessment/Training   Position sitting   DRESSING ASSESSED Don Socks   Independence Level  set up required   Toileting Assessment/Training   Position sitting;standing   TOILETING ASSESSED Adjust clothing prior;Adjust clothing after;Perineal hygiene   Independence Level  supervision required   Comment Cleaning peri area and adjusting underwear   Grooming/Oral Hygeine  Assessment/Training   Position standing  Independence Level supervision required   Comment Washing hands   Balance Skill Training   Comment with FWW   Sitting Balance: Static fair + balance   Sitting, Dynamic (Balance) fair + balance   Sit-to-Stand Balance fair balance   Standing Balance: Static fair balance    Standing Balance: Dynamic fair balance   Post Treatment Status   Post Treatment Patient Status Patient sitting in bedside chair or w/c;Call light within reach;Patient safety alarm activated   Support Present Post Treatment  None   Communication Post Treatement Nurse   Clinical Impression   Functional Level at Time of Session Patient tolerates session well this date.  Patient reports improvement in bilateral LE strength with IVIG treatments and able to complete household distance safely this date.  Patient safe for home DC with intermittent assistance, OP PT services, and shower chair.  Will continue to follow.   Criteria for Skilled Therapeutic Interventions Met (OT) yes   Rehab Potential good   Therapy Frequency minimum of 1x/week   Predicted Duration of Therapy until discharge   Anticipated Equipment Needs at Discharge shower chair   Anticipated Discharge Disposition home with assist;home with outpatient services         Therapist:   Lucky Cowboy, OT  Pager #: [phone]

## 2022-04-06 NOTE — Care Plan (Signed)
[place]  Physical Therapy Progress Note      Patient Name: [name]  Date of Birth: [birth year]  Height:  165.1 cm (5\' 5" )  Weight:  89.1 kg (196 lb 6.9 oz)  Room/Bed: 09/A  Payor: /     Assessment:     Pt tolerated tx session very well with therapy, continues to progress improved overall activtiy tolerance and transfers/ambulation ability with therapy. Progressing very well and anticipate pt will be able to return home with assist and outpatient neuro PT once medically ready for d/c    Discharge Needs:   Equipment Recommendation: front wheeled walker    The patient presents with mobility limitations due to impaired balance, impaired range of motion, impaired strength, and impaired functional activity tolerance that significantly impair/prevent patient's ability to participate in mobility-related activities of daily living (MRADLs) including  ambulation and transfers in order to safely complete, toileting, laundering/household tasks, safely entering/exiting the home, in reasonable time. This functional mobility deficit can be sufficiently resolved with the use of a front wheeled walker in order to decrease the risk of falls, morbidity, and mortality in performance of these MRADLs.  Patient is able to safely use this assistive device.    Discharge Disposition: home with assist, home with outpatient services (outpatient neuro PT)    JUSTIFICATION OF DISCHARGE RECOMMENDATION   Based on current diagnosis, functional performance prior to admission, and current functional performance, this patient requires continued PT services in home with assist, home with outpatient services (outpatient neuro PT) in order to achieve significant functional improvements in these deficit areas: aerobic capacity/endurance, gait, locomotion, and balance, motor function, muscle performance, neuromuscular, posture, ROM (range of motion).      Plan:   Continue to follow patient according to  established plan of care.  The risks/benefits of therapy have been discussed with the patient/caregiver and he/she is in agreement with the established plan of care.     Subjective & Objective:        04/06/22 [id number]   Therapist Pager   PT Assigned/ Pager # [place] [phone]   Rehab Session   Document Type therapy progress note (daily note)   Total PT Minutes: 14   Patient Effort good   Symptoms Noted During/After Treatment none   General Information   Patient Profile Reviewed yes   Onset of Illness/Injury or Date of Surgery 04/03/22   Medical Lines PIV Line   Respiratory Status room air   Existing Precautions/Restrictions full code;fall precautions   General Observations of Patient Pt supine in bed upon arrival, pleasant and cooperative. Reports she "didn't get must rest because of my restless legs". Agreeable to therapy at this time   Mutuality/Individual Preferences   Individualized Care Needs OOB FWW x1 CGA   Patient-Specific Goals (Include Timeframe) get better, get back home   Plan of Care Reviewed With patient   Pre Treatment Status   Pre Treatment Patient Status Patient supine in bed;Call light within reach;Telephone within reach;Patient safety alarm activated;Nurse approved session   Support Present Pre Treatment  None   Communication Pre Treatment  Nurse   Communication Pre Treatment Comment RN approved PT and OT session   Cognitive Assessment/Interventions   Behavior/Mood Observations behavior appropriate to situation, WNL/WFL   Orientation Status oriented x 4   Attention WNL/WFL   Follows Commands WNL   Vital Signs   O2 Delivery Pre Treatment room air   O2 Delivery Post Treatment room air   Vitals  Comment no s/s of distress noted on RA   Pain Assessment   Pre/Posttreatment Pain Comment did not c/o pain at this time   Bed Mobility Assessment/Treatment   Bed Mobility, Assistive Device Head of Bed Elevated;bed rails   Supine-Sit Independence supervision required   Sit to Supine, Independence not tested   Safety  Issues decreased use of legs for bridging/pushing;impaired trunk control for bed mobility   Impairments balance impaired;endurance;coordination impaired;flexibility decreased;motor control impaired;postural control impaired;ROM decreased;strength decreased   Transfer Assessment/Treatment   Sit-Stand Independence contact guard assist;verbal cues required   Stand-Sit Independence contact guard assist;verbal cues required   Sit-Stand-Sit, Assist Device walker, front wheeled   Bed-Chair Independence contact guard assist   Bed-Chair-Bed Assist Device walker, front wheeled   Transfer Safety Issues balance decreased during turns;sequencing ability decreased;step length decreased;weight-shifting ability decreased   Transfer Impairments balance impaired;coordination impaired;endurance;flexibility decreased;motor control impaired;postural control impaired;ROM decreased;strength decreased   Gait Assessment/Treatment   Total Distance Ambulated 75   Independence  contact guard assist   Assistive Device  walker, front wheeled   Distance in Feet 75   Gait Speed decreased/slowed   Deviations  cadence decreased;narrow BOS;step length decreased   Safety Issues  sequencing ability decreased;balance decreased during turns;step length decreased;weight-shifting ability decreased   Impairments  balance impaired;endurance;coordination impaired;flexibility decreased;motor control impaired;postural control impaired;ROM decreased;strength decreased   Balance Skill Training   Comment with CGA and FWW   Sitting Balance: Static fair + balance   Sitting, Dynamic (Balance) fair + balance   Sit-to-Stand Balance fair balance   Standing Balance: Static fair balance   Standing Balance: Dynamic fair balance   Systems Impairment Contributing to Balance Disturbance musculoskeletal;neuromuscular   Identified Impairments Contributing to Balance Disturbance impaired postural control;decreased ROM;decreased strength   Functional Endurance Training   Comment,  Functional Endurance fair +   Post Treatment Status   Post Treatment Patient Status Patient sitting in bedside chair or w/c;Telephone within reach;Call light within reach;Patient safety alarm activated   Support Present Post Treatment  None   Communication Post Treatement Nurse   Communication Post Treatment Comment updated on pt performance   Plan of Care Review   Plan Of Care Reviewed With patient   Basic Mobility Am-PAC/6Clicks Score (APPROVED Staff)   Turning in bed without bedrails 4   Lying on back to sitting on edge of flat bed 4   Moving to and from a bed to a chair 3   Standing up from chair 3   Walk in room 3   Climbing 3-5 steps with railing 3   6 Clicks Raw Score total 20   Standardized (t-scale) score 43.99   Patient Mobility Goal (JHHLM) 6- Walk 10 steps or more 2X/day   Exercise/Activity Level Performed 7- Walked 25 feet or more   Functional Impairment   Overall Functional Impairments/Problem List balance impaired;coordination impaired;endurance;flexibility decreased;motor control impaired;ROM decreased;postural control impaired;strength decreased   Physical Therapy Clinical Impression   Assessment Pt tolerated tx session very well with therapy, continues to progress improved overall activtiy tolerance and transfers/ambulation ability with therapy. Progressing very well and anticipate pt will be able to return home with assist and outpatient neuro PT once medically ready for d/c   Patient/Family Goals Statement get better, get home   Criteria for Skilled Therapeutic yes;skilled treatment is necessary;meets criteria   Pathology/Pathophysiology Noted musculoskeletal;neuromuscular   Impairments Found (describe specific impairments) aerobic capacity/endurance;gait, locomotion, and balance;motor function;muscle performance;neuromuscular;posture;ROM (range of motion)   Functional Limitations in Following  self-care;home  management   Disability: Inability to Perform community/leisure   Rehab Potential good    Therapy Frequency 1x/day;minimum of 2x/week   Predicted Duration of Therapy Intervention (days/wks) until discharge   Anticipated Equipment Needs at Discharge (PT) front wheeled walker   Anticipated Discharge Disposition home with assist;home with outpatient services  (outpatient neuro PT)       Therapist:   Rexford Maus, PT 09:04 04/06/22  Pager #: [phone]

## 2022-04-06 NOTE — Care Management Notes (Addendum)
[place] Management Initial Evaluation    Patient Name: [name]  Date of Birth: 1971/02/03  Sex: female  Date/Time of Admission: 04/03/2022  1:33 PM  Room/Bed: 09/A  Payor: /   Primary Care Providers:  Ancil Boozer, MD, MD (General)    Pharmacy Info:   Preferred [facility] [place] [address]    [address] [place] [address]    Phone: [phone] Fax: [phone]    Hours: Not open 24 hours          Emergency Contact Info:   Extended Emergency Contact Information  Primary Emergency Contact: Daniel,DONALD  Address: [address] [place] [address] [place]  Mobile Phone: [phone]  Relation: Husband    History:   HALIMA FOGAL is a 51 y.o., female, admitted     Height/Weight: 165.1 cm (5' 5" ) / 89.1 kg (196 lb 6.9 oz)     LOS: 3 days   Admitting Diagnosis: Weakness [R53.1]    Assessment:      04/06/22 1534   Assessment Details   Assessment Type Admission   Date of Care Management Update 04/06/22   Date of Next DCP Update 04/09/22   Insurance Information/Type   Insurance type Scientist, water quality Concerns none   Living Environment   Select an age group to open "lives with" row.  Adult   Lives With spouse   Living [place] Accessibility stairs to enter home;bed and bath on same level   Care Management Plan   Discharge Planning Status initial meeting   Discharge plan discussed with: Patient   CM will evaluate for rehabilitation potential yes   Discharge Needs Assessment   Equipment Currently Used at Home walker, front wheeled   Equipment Needed After Discharge none   Discharge Facility/Level of Care Needs Acute Rehab Placement/Return (not psych)(code 34)   Transportation Available family or friend will provide   Referral Information   Admission Type inpatient   Address Verified verified-no changes   ADVANCE DIRECTIVES   Does the Patient have an  Advance Directive? No, Information Offered and Refused   Home Main Entrance   Stair Railings, Main Entrance none   Number of Stairs, Main Entrance two         Discharge Plan:  Acute Rehab Placement/Return (not psych) (code 34)  CCM met w/ pt at [facility] to complete initial assessment pt is a&o, lives w/ husband in a house w/ 2 STE home. One level inside home. Not current w/ HH. Has 2 FWW. Declined MPOA. Husband will transport pt home. Pt states she has new insurance and supplied registration w/ the card upon admission. [facility].  Current w/ Dr Micheline Chapman.     PT/OT recs IPR, discussed IPR w/ pt and she stated [facility] would be the closest for her and is willing for referral to be sent there via allscripts.referral sent.      The patient will continue to be evaluated for developing discharge needs.     Case Manager: Kathi Ludwig, [place]  Phone: [phone]

## 2022-04-06 NOTE — Care Plan (Signed)
[facility]  Rehabilitation Services  Speech Therapy Initial Swallow Evaluation     Patient Name: [name]  Date of Birth: 1970-12-30  Height: Height: 165.1 cm (5\' 5" )  Weight: Weight: 89.1 kg (196 lb 6.9 oz)  Room/Bed: 09/A  Payor: /      Assessment:  Functional Level At Time Of Eval: Functional oropharyngeal swallow. No overt s/s aspiration for PO trials of Level 0 Liquid: Thin (Thin) or regular solids. Mastication extended, however, functional, pt stating she has a reduced rate of PO intake and ensures adequate mastication. Swallow initiation timely. Complete oral clearance. Pt states baseline swallowing difficulties which have been worse since progression of symptoms began this year, as well as reported swallowing difficulties in her mother and grandmother. Pt states trouble swallowing liquids as well as experiencing a sticking sensation in her chest when swallowing solids and pills. Pt reports heartburn and regurgitation, however, this has worsened since having sleeping difficulties and eating late at night recently. SLP educated pt on reflux precautions and offered pt MBSS as an option to investigate suspected esophageal component of swallowing difficulties, however, pt opted to wait to see how her IVIG treatment outcome ends up.    SLP Swallowing Diagnosis: Functional oropharyngeal swallow  SLP Diet Recommendation: thin liquids, regular solid    Aspiration Precautions: maintain upright sitting position for eating, maintain upright posture during/after eating for 30 minutes  Signs/Symptoms of Aspiration Noted (Swallowing): none     Reflux precautions: small sips/bites, ensure adequate mastication, liquid wash, coat dry solids in gravy/sauce, ensure upright positioning at least 2 hours before bed following evening meal, raise HOB.        Discharge Needs:  Discharge Disposition: home (will need Justification of D/C recommendation if placement is required.)    JUSTIFICATION OF DISCHARGE  RECOMMENDATION   Based on current diagnosis, functional performance prior to admission, and current functional performance, this patient requires continued SLP services in home in order to achieve significant functional improvements for Swallowing.       Plan:  To provide Speech Therapy services evaluation only for duration of  .     The risks/benefits of therapy have been discussed with the patient/caregiver and he/she is in agreement with the established plan of care.         Subjective & Objective        04/06/22 1050   Therapist Pager   SLP Pager Jandy Brackens [phone]   Rehab Session   Document Type evaluation   Total OT Minutes: 36   Patient Effort good   Symptoms Noted During/After Treatment none   General Information   Patient Profile Reviewed yes   Patient/Family/Caregiver Comments/Observations Pt states she has baseline swallowing difficulties, as well as her mother and grandmother. Pt states she has more trouble with liquids. For solids and pills, she reports a sticking sensation in her chest and 'choking' like episodes. She reports having to chew food well, eat slowly. She crushes her pills, uses liquidized medication, or chooses gel-capsule pills where possible, d/t the powdery pills causing swallowing difficulties.   General Observations of Patient Pt awake & alert, sitting upright in chair, working on laptop and phone, agreeable to swallow session. Pt on IVIG treatment.   Pertinent History of Current Functional Problem Per MD note: "YEHUDIS MONCEAUX is a 51 y.o., White female who presents as a direct admit from Dr. 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 Patient reports progressive gait impairment since January 2022"   Mutuality/Individual Preferences  Anxieties, Fears or Concerns Pt anxious about being in hospital, and outcome following IVIG treatment.   Individualized Care Needs Level 0 thin liquids, regular solids. Reflux precautions, small sips/bites, coat dry solids in gravy/sauces,  ensure adequate mastication, liquid wash, stay upright for at least 2 hours following evening meal before bed, raise 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)   [place] in Care not present at bedside   Coping/Psychosocial Response Interventions   Trust Relationship/Rapport care explained;choices provided;emotional support provided;empathic listening provided;questions answered;questions encouraged;reassurance provided;thoughts/feelings acknowledged   Diversional Activities computer;smartphone;television   Cognitive   Level of Consciousness alert   Arousal Level opens eyes spontaneously   Orientation oriented x 4   Speech clear;logical   Mood/Behavior anxious;cooperative;calm   Oxygenation   Oxygenation Room air   Oral Motor Structure/Functional Assessment   Dentition (Oral Motor) present and adequate  (Requires bottom tooth replacement)   Secretion Management (Oral Motor Assessment) WFL   Mucosal Quality (Oral Motor Assessment) good   Velar Elevation (Oral Motor) WFL   Volitional Swallow (Oral Motor Assessment) no issues initiating volitional swallow   Volitional Cough (Oral Motor Assessment) no issues initiating volitional cough   Oral Musculature (Oral Motor Assessment) WFL   Non Instrumental/Clinical Swallow (NIS)   Additional Documentation Thin Liquid Trial (NIS) (Group);Solid Texture Trial (NIS) (Group)   Thin Liquid Trial (NIS)   Mode of Presentation, Thin Liquid (NIS) self-fed   Oral Phase Results, Thin Liquid (NIS) intact oral phase without signs of dysfunction   Pharyngeal Phase Results, Thin Liquid (NIS) safe swallow, no  signs/symptoms of aspiration or penetration   Volume Presented in mL, Thin Liquid (NIS) patient controlled volumes  (4 oz)   Solid Texture Trial (NIS)   Mode of Presentation, Solid Food (NIS) self-fed   Oral Phase Results, Solid Food (NIS) intact oral phase without signs of dysfunction   Pharyngeal Phase Results, Solid Food (NIS) safe swallow, no signs/symptoms of aspiration or penetration   Volume Presented in mL, Solid Food (NIS) other (see comments)  (1x graham cracker)   Swallowing Clinical Impression   Patient's Goals For Discharge return home   SLP Swallowing Diagnosis other (see comments)  (Functional oropharyngeal swallow)   Functional Level At Time Of Eval Functional oropharyngeal swallow. No overt s/s aspiration for PO trials of Level 0 Liquid: Thin (Thin) or regular solids. Mastication extended, however, functional, pt stating she has a reduced rate of PO intake and ensures adequate mastication. Swallow initiation timely. Complete oral clearance. Pt states baseline swallowing difficulties which have been worse since progression of symptoms began this year, as well as reported swallowing difficulties in her mother and grandmother. Pt states trouble swallowing liquids as well as experiencing a sticking sensation in her chest when swallowing solids and pills. Pt reports heartburn and regurgitation, however, this has worsened since having sleeping difficulties and eating late at night recently. SLP educated pt on reflux precautions and offered pt MBSS as an option to investigate suspected esophageal component of swallowing difficulties, however, pt opted to wait to see how her IVIG treatment outcome ends up.   Therapy Frequency evaluation only   SLP Diet Recommendation thin liquids;regular solid  Recommended Feeding/Eating Techniques (Swallow Eval) maintain upright sitting position for eating;maintain upright posture during/after eating for 30 minutes   Signs/Symptoms of Aspiration Noted (Swallowing) none    Anticipated Discharge Disposition home   Plan of care reviewed with: Patient;RN          Therapist:  Beatris Si, SLP   Pager #: [phone]  Phone #: [phone]

## 2022-04-07 DIAGNOSIS — E538 Deficiency of other specified B group vitamins: Secondary | ICD-10-CM

## 2022-04-07 LAB — MONOCLONAL GAMMOPATHY PROFILE WITH IMMUNOTYPING REFLEX
ALBUMIN: 3.9 g/dL (ref 3.5–5.0)
KAPPA FREE LIGHT CHAINS: 5.82 mg/dL — ABNORMAL HIGH (ref 0.33–1.94)
KAPPA/LAMBDA FLC RATIO: 4.07 — ABNORMAL HIGH (ref 0.26–1.65)
LAMBDA FREE LIGHT CHAINS: 1.43 mg/dL (ref 0.57–2.63)
TOTAL PROTEIN: 8.4 g/dL — ABNORMAL HIGH (ref 6.0–7.9)

## 2022-04-07 LAB — PYRUVIC ACID, BLOOD: PYRUVIC ACID (PYRUVATE),B: 0.81 mg/dL (ref 0.30–1.50)

## 2022-04-07 LAB — KAPPA AND LAMBDA FREE LIGHT CHAINS, SERUM
KAPPA FREE LIGHT CHAINS: 5.82 mg/dL — ABNORMAL HIGH (ref 0.33–1.94)
KAPPA/LAMBDA FLC RATIO: 4.07 — ABNORMAL HIGH (ref 0.26–1.65)
LAMBDA FREE LIGHT CHAINS: 1.43 mg/dL (ref 0.57–2.63)

## 2022-04-07 LAB — COPPER, SERUM: COPPER: 100 ug/dL (ref 70–175)

## 2022-04-07 LAB — ZINC, SERUM: ZINC: 48 ug/dL — ABNORMAL LOW (ref 60–130)

## 2022-04-07 MED ORDER — DIPHENHYDRAMINE 25 MG CAPSULE
25.0000 mg | ORAL_CAPSULE | ORAL | Status: AC
Start: 2022-04-07 — End: 2022-04-07
  Administered 2022-04-07: 25 mg via ORAL
  Filled 2022-04-07: qty 1

## 2022-04-07 MED ORDER — IMMUNE GLOB,GAMM(IGG) 10 %-PRO-IGA 0 TO 50 MCG/ML INTRAVENOUS SOLUTION
400.0000 mg/kg | INTRAVENOUS | Status: AC
Start: 2022-04-07 — End: 2022-04-07
  Administered 2022-04-07: 25000 mg via INTRAVENOUS
  Administered 2022-04-07: 0 mg via INTRAVENOUS
  Filled 2022-04-07: qty 250

## 2022-04-07 MED ORDER — FOLIC ACID 1 MG TABLET
1.0000 mg | ORAL_TABLET | Freq: Every day | ORAL | Status: DC
Start: 2022-04-08 — End: 2022-12-08

## 2022-04-07 MED ORDER — FERROUS SULFATE 324 MG (65 MG IRON) TABLET,DELAYED RELEASE
324.0000 mg | DELAYED_RELEASE_TABLET | ORAL | Status: DC
Start: 2022-04-09 — End: 2022-12-08

## 2022-04-07 MED ORDER — PROMETHAZINE 25 MG TABLET
12.5000 mg | ORAL_TABLET | ORAL | Status: AC
Start: 2022-04-07 — End: 2022-04-07
  Administered 2022-04-07: 12.5 mg via ORAL
  Filled 2022-04-07: qty 1

## 2022-04-07 MED ORDER — ACETAMINOPHEN 325 MG TABLET
650.0000 mg | ORAL_TABLET | ORAL | Status: AC
Start: 2022-04-07 — End: 2022-04-07
  Administered 2022-04-07: 650 mg via ORAL
  Filled 2022-04-07: qty 2

## 2022-04-08 LAB — METHYLMALONIC ACID (MMA), QUANTITATIVE, PLASMA: METHYLMALONIC ACID: 187 nmol/L (ref 87–318)

## 2022-04-08 LAB — HEP-2 SUBSTRATE ANTINUCLEAR ANTIBODIES (ANA), SERUM: ANA INTERPRETATION: NEGATIVE

## 2022-04-09 LAB — GAD65 NEUROLOGICAL SYNDROME ANTIBODY TEST

## 2022-04-09 LAB — VITAMIN B6 (PYRIDOXAL 5-PHOSPHATE), PLASMA: VITAMIN B6, PLASMA: 19.2 ng/mL (ref 2.1–21.7)

## 2022-04-10 NOTE — Care Management Notes (Signed)
Referral Information  ++++++ Placed Provider #1 ++++++  Case Manager: Tamera Cole  Provider Type: Acute -Rehab  Provider Name: [facility]  Address:  [address]  [place], [place] [id number]  Contact: Nancy Cooper    Phone: [phone] x  Fax:   Fax: [phone]

## 2022-04-13 ENCOUNTER — Ambulatory Visit (INDEPENDENT_AMBULATORY_CARE_PROVIDER_SITE_OTHER): Payer: Self-pay | Admitting: NEUROLOGY

## 2022-04-13 ENCOUNTER — Ambulatory Visit (INDEPENDENT_AMBULATORY_CARE_PROVIDER_SITE_OTHER): Payer: 59 | Admitting: NEUROLOGY

## 2022-04-15 LAB — MYASTHENIA GRAVIS PANEL 3
ACETYLCHOLINE REC BIND AB: <0.3 nmol/L
ACETYLCHOLINE REC BLOC AB  - EAST: <15 (ref <15)
ACETYLCHOLINE REC MOD AB: 19
STRIATED MUSCLE AB SCREEN: NEGATIVE

## 2022-04-28 ENCOUNTER — Ambulatory Visit (INDEPENDENT_AMBULATORY_CARE_PROVIDER_SITE_OTHER): Payer: Self-pay | Admitting: Neurology

## 2022-05-07 ENCOUNTER — Ambulatory Visit: Payer: 59 | Attending: Neurology | Admitting: Neurology

## 2022-05-07 DIAGNOSIS — G0481 Other encephalitis and encephalomyelitis: Secondary | ICD-10-CM | POA: Insufficient documentation

## 2022-05-07 DIAGNOSIS — F419 Anxiety disorder, unspecified: Secondary | ICD-10-CM | POA: Insufficient documentation

## 2022-05-07 DIAGNOSIS — Z79899 Other long term (current) drug therapy: Secondary | ICD-10-CM | POA: Insufficient documentation

## 2022-05-07 MED ORDER — SERTRALINE 100 MG TABLET
100.0000 mg | ORAL_TABLET | Freq: Every day | ORAL | 5 refills | Status: DC
Start: 2022-05-07 — End: 2022-11-18

## 2022-05-07 MED ORDER — HYDROXYZINE PAMOATE 25 MG CAPSULE
25.0000 mg | ORAL_CAPSULE | Freq: Three times a day (TID) | ORAL | 5 refills | Status: DC | PRN
Start: 2022-05-07 — End: 2022-07-27

## 2022-05-07 NOTE — Progress Notes (Addendum)
TELEMEDICINE DOCUMENTATION:    Patient Location:  MyChart video visit from home address: [address]  [place] [address]    Patient/family aware of provider location:  yes  Patient/family consent for telemedicine:  yes  Examination observed and performed by:  Rosalyn Gess, MD      [facility] Department of Neurology  Multiple Sclerosis and Clinical Neuroimmunology Clinic      Date: 05/07/2022  Name: [name]  Age:  51 y.o.  Referring Physician:   No referring provider defined for this encounter.    History of Present Illness:  History obtained from:  patient    ------------------------  Please note, some information in this section carried forward from prior notes. 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 Progressively worsening.  -01/2022: Admitted for further workup including LP and likely trial of IVIG and steroids. She declined all three and left the hospital.     Pertinent studies:  --MRI brain 11/2021: Per personal review on synapse. There is significant cerebellar atrophy with diffuse hyperintensity on Flair. Review of prior MRI in 05/2021 reveals similar atrophy of the cerebellum.   --CT chest/abd/pelvis 01/2022 (outside): Read as without signs of malignancy  --MRI 02/16/22: Predominantly cerebellar parenchymal volume loss. Otherwise unremarkable supratentorial brain. No acute intracranial process.  I personally reviewed this MRI - I agree that there is cerebellar volume loss. I also do still think there are FLAIR changes in her cerebellum and bilateral cerebellar peduncles.  --Paraneoplastic panel ([facility]) negative in 12/2021. Vitamin E normal.  --*Multiple  labs in 03/2022 available in Epic    ------------------------    [name] is a 50 y.o. female who returns in follow up for   Chief Complaint   Patient presents with    Follow Up       Since last visit:  -Walking/tremor better, can get up and down the stairs better  -Anxiety still through the roof, has had panic attacks  -Thinking is better  -She thinks she is about 65% better      Past Medical History:   Diagnosis Date    MDD (major depressive disorder)     Restless leg syndrome          Current Outpatient Medications   Medication Sig    clonazePAM (KLONOPIN) 1 mg Oral Tablet Take 1 Tablet (1 mg total) by mouth Twice daily (Patient taking differently: Take 1 Tablet (1 mg total) by mouth Twice per day as needed (anxiety))    ferrous sulfate (FERATAB) 324 mg (65 mg iron) Oral Tablet, Delayed Release (E.C.) Take 1 Tablet (324 mg total) by mouth Every other day    folic acid (FOLVITE) 1 mg Oral Tablet Take 1 Tablet (1 mg total) by mouth Once a day    sertraline (ZOLOFT) 50 mg Oral Tablet Take 1 Tablet (50 mg total) by mouth Once a day     Allergies   Allergen Reactions    Penicillins Itching, Rash and Swelling     Family Medical History:    None         Social History     Socioeconomic History  Marital status: Married   Occupational History    Occupation: Ecologist   Tobacco Use    Smoking status: Never    Smokeless tobacco: Never   Vaping Use    Vaping Use: Never used   Substance and Sexual Activity    Alcohol use: Never    Drug use: Never     Social Determinants of Health     Financial Resource Strain: Low Risk  (04/03/2022)    Financial Resource Strain     SDOH Financial: No   Transportation Needs: Low Risk  (04/03/2022)    Transportation Needs     SDOH Transportation: No   Social Connections: Medium Risk (04/03/2022)    Social Connections     SDOH Social Isolation: 3 to 5 times a week   Housing Stability: Low Risk  (04/03/2022)    Housing Stability     SDOH Housing Situation: I have  housing.     SDOH Housing Worry: No       Review of Systems  Other than above, no pertinent positives.    Examination:  Objective: (limited exam via tele)  General: Appears well, no distress.  Neurologic Exam:  Ophthalomscopic: Unable to evaluate over tele.  Mental status:  Level of Consciousness: Alert.  Orientations: Alert and oriented during conversation  Memory, Registration, Recall, and Following of commands is normal to conversation.  Knowledge: Good.  Language: Normal.  Speech: Normal.  Cranial nerves: Eyes midline, face symmetric.  Muscle tone: Unable to test via tele.  Muscle exam: Moves bilateral UE against gravity.  Reflexes: Unable to test via tele.  Sensory: Unable to test via tele.  Coordination: Unable to fully assess via tele, no obvious tremor.  Gait: Did not ambulate during this encounter.    Assessment and Plan:  Assessment/Plan   1. Autoimmune encephalitis    2. Anxiety    3. Medication management          Orders Placed This Encounter    sertraline (ZOLOFT) 100 mg Oral Tablet    hydrOXYzine pamoate (VISTARIL) 25 mg Oral Capsule     Remains unclear whether this is AE, she declined LP during admission, but given improvement with IVIG will plan on trial of monthly maintenance IVIG  Increase Zoloft to 100 mg daily  Add hydroxyzine PRN  If no improvement in anxiety with above will likely recommend psychiatry referral  3 month follow up - will see if she can come in person and see Dr Daisey Must on the same day for reexamination      Rosalyn Gess, MD 05/07/2022   Assistant Professor, Department of Neurology

## 2022-05-26 ENCOUNTER — Telehealth (INDEPENDENT_AMBULATORY_CARE_PROVIDER_SITE_OTHER): Payer: Self-pay

## 2022-05-26 NOTE — Telephone Encounter (Signed)
IVIG orders sent to [facility] home infusion.   Nolon Stalls, PHARMD  05/26/2022, 12:13

## 2022-05-27 ENCOUNTER — Encounter (INDEPENDENT_AMBULATORY_CARE_PROVIDER_SITE_OTHER): Payer: Self-pay | Admitting: Neurology

## 2022-06-01 ENCOUNTER — Ambulatory Visit: Payer: 59 | Attending: Nurse Practitioner | Admitting: Nurse Practitioner

## 2022-06-01 ENCOUNTER — Encounter (INDEPENDENT_AMBULATORY_CARE_PROVIDER_SITE_OTHER): Payer: Self-pay | Admitting: Nurse Practitioner

## 2022-06-01 DIAGNOSIS — G0481 Other encephalitis and encephalomyelitis: Secondary | ICD-10-CM

## 2022-06-01 DIAGNOSIS — R519 Headache, unspecified: Secondary | ICD-10-CM

## 2022-06-01 DIAGNOSIS — Z5181 Encounter for therapeutic drug level monitoring: Secondary | ICD-10-CM

## 2022-06-01 DIAGNOSIS — R110 Nausea: Secondary | ICD-10-CM

## 2022-06-01 HISTORY — DX: Other encephalitis and encephalomyelitis: G04.81

## 2022-06-01 MED ORDER — PROCHLORPERAZINE MALEATE 10 MG TABLET
10.0000 mg | ORAL_TABLET | Freq: Four times a day (QID) | ORAL | 5 refills | Status: DC | PRN
Start: 2022-06-01 — End: 2022-11-18

## 2022-06-01 MED ORDER — DEXAMETHASONE 4 MG TABLET
6.0000 mg | ORAL_TABLET | Freq: Every day | ORAL | 0 refills | Status: DC
Start: 2022-06-01 — End: 2022-12-08

## 2022-06-01 MED ORDER — AMITRIPTYLINE 10 MG TABLET
10.00 mg | ORAL_TABLET | Freq: Every evening | ORAL | 5 refills | Status: AC
Start: 2022-06-01 — End: ?

## 2022-06-01 MED ORDER — SUMATRIPTAN 50 MG TABLET
50.0000 mg | ORAL_TABLET | Freq: Once | ORAL | 5 refills | Status: DC | PRN
Start: 2022-06-01 — End: 2023-03-12

## 2022-06-01 NOTE — Progress Notes (Signed)
TELEMEDICINE DOCUMENTATION:    Patient Location:  MyChart video visit from home address: [address]  [place] [address]    Patient/family aware of provider location:  yes  Patient/family consent for telemedicine:  yes  Examination observed and performed by:  Read Drivers, APRN      [facility] Department of Neurology  Multiple Sclerosis and Clinical Neuroimmunology Clinic    Date:  06/01/2022  Name: [name]  Age:  51 y.o.  Referring Physician:   No referring provider defined for this encounter.    History of Present Illness:  History obtained from:  patient    ------------------------  Disease categorization: Autoimmune cerebellar degenreation  Date diagnosed: 2023    Current DMT: Rituximab is planned but hasn't received yet.   Previous DMTs: None  Steroid history: No steroids, but got IVIG during hospitalization in 03/2022.    Neurologic history:  -Since 2022: Gait impairment, falls, impaired spatial judgement and depth perception, cognitive decline, mood changes. Progressively worsening.  -01/2022: Admitted for further workup including LP and likely trial of IVIG and steroids. She declined and left the hospital.    Pertinent studies:  --MRI brain 11/2021: Per personal review on synapse. There is significant cerebellar atrophy with diffuse hyperintensity on Flair. Review of prior MRI in 05/2021 reveals similar atrophy of the cerebellum.   --CT chest/abd/pelvis 01/2022 (outside): Read as without signs of malignancy  --MRI 02/16/22: Predominantly cerebellar parenchymal volume loss. Otherwise unremarkable supratentorial brain. No acute intracranial process.  I personally reviewed this MRI - I agree that there is cerebellar volume loss. I also do still think there are FLAIR changes in her cerebellum and bilateral cerebellar peduncles.  --Paraneoplastic panel ([facility]) negative in 12/2021. Vitamin E normal.  --Multiple labs in 03/2022 available in Epic  ------------------------    [name] is a 51 y.o.  female who presents with a chief concern of   Chief Complaint   Patient presents with    Headache      Since Last Visit:  - For the last two weeks has had daily headache.    - Pain is on top of her head.  - + photophobia, phonophobia and nausea  - Ice cap helps at times.   - Over the counter remedies - she has tried Ibuprofen, Advil and Tylenol.  Minimal relief.  Now feels like headache comes right back after minimal relief.   - Occasional tinnitus with headache  - No vision changes.   - No other new neurological symptoms.      Past Medical History:   Diagnosis Date    MDD (major depressive disorder)     Restless leg syndrome      Current Outpatient Medications   Medication Sig    amitriptyline (ELAVIL) 10 mg Oral Tablet Take 1 Tablet (10 mg total) by mouth Every night    dexAMETHasone (DECADRON) 4 mg Oral Tablet Take 1.5 Tablets (6 mg total) by mouth Once a day 3/d x 1d, 2/d x 1d, 1/d x 1d    ferrous sulfate (FERATAB) 324 mg (65 mg iron) Oral Tablet, Delayed Release (E.C.) Take 1 Tablet (324 mg total) by mouth Every other day    folic acid (FOLVITE) 1 mg Oral Tablet Take 1 Tablet (1 mg total) by mouth Once a day    hydrOXYzine pamoate (VISTARIL) 25 mg Oral Capsule Take 1 Capsule (25 mg total) by mouth Three times a day as needed for Anxiety    prochlorperazine (COMPAZINE) 10 mg  Oral Tablet Take 1 Tablet (10 mg total) by mouth Every 6 hours as needed (Nausea/headache)    sertraline (ZOLOFT) 100 mg Oral Tablet Take 1 Tablet (100 mg total) by mouth Once a day    SUMAtriptan (IMITREX) 50 mg Oral Tablet Take 1 Tablet (50 mg total) by mouth Once, as needed for Migraine May repeat in 2 hours in needed     Allergies   Allergen Reactions    Penicillins Itching, Rash and Swelling     Family Medical History:    None         Social History     Socioeconomic History    Marital status: Married   Occupational History    Occupation: Energy manager   Tobacco Use    Smoking status: Never    Smokeless tobacco: Never    Vaping Use    Vaping Use: Never used   Substance and Sexual Activity    Alcohol use: Never    Drug use: Never     Social Determinants of Health     Financial Resource Strain: Low Risk  (04/03/2022)    Financial Resource Strain     SDOH Financial: No   Transportation Needs: Low Risk  (04/03/2022)    Transportation Needs     SDOH Transportation: No   Social Connections: Medium Risk (04/03/2022)    Social Connections     SDOH Social Isolation: 3 to 5 times a week   Housing Stability: Low Risk  (04/03/2022)    Housing Stability     SDOH Housing Situation: I have housing.     SDOH Housing Worry: No       Review of Systems  Gen: No malaise. ID: No infections. HEENT: + headaches.  No hearing or vision changes. Respiratory: No shortness of breath or cough. Cardiac: No chest pain. GU: No hematuria. GI: No GI bleeding. Extremities: No peripheral edema. Musculoskeletal: No joint pain, swelling. Neuro: As above. Psychiatric: No mood changes.    Examination:    Vitals: There were no vitals taken for this visit. - Telemedicine Visit      General Exam:  General: No acute distress.  HEENT: No scleral icterus.  Extremities: Moves all extremities.  Psychiatric: Affect calm. Tearful at end of visit - wondering if she will ever get better.    Neurologic Exam:  Orientation: Awake, alert, appropriately oriented.  Memory: Recent and remote memory appear intact. Formal memory testing not completed today.  Attention: Normal in conversation.  Knowledge: Appropriate.  Language: Fluent with intact comprehension.  Speech: Normal.  Cranial nerves:  Extraocular movements are intact.  Face activates symmetrically. Hearing intact over video.  Coordination: Appears normal over video.  Gait: Normal casual gait as observed over video.     Assessment and Plan:    Assessment/Plan   1. Headache    2. Nausea    3. Autoimmune encephalitis    4. Encounter for medication monitoring    Patient with AE earlier this year now with two week history of  headaches.  She states the headache has been daily.  No precipitating illness.  +nausea, photophobia, and phonophobia.  Minimal relief with OTC remedies.  No visual changes or Aura.  Occasional tinnitus.    Will try dexamethasone dose pack to break headache cycle.  Rx sent  Preventative treatment - Elavil 10 mg nightly.  Rx sent.  Abortive - Imitrex and Compazine.  Rx's sent.   Keep return follow up schedule with Dr. Leonides Schanz.  Call sooner with changes/concerns.   Orders Placed This Encounter    dexAMETHasone (DECADRON) 4 mg Oral Tablet    amitriptyline (ELAVIL) 10 mg Oral Tablet    prochlorperazine (COMPAZINE) 10 mg Oral Tablet    SUMAtriptan (IMITREX) 50 mg Oral Tablet     No follow-ups on file.     On the day of the encounter, a total of 39 minutes was spent on patient encounter including review of historical information, examination, documentation, and post visit activities.     Rozell Searing, APRN 06/01/2022  Advance Practice Provider, Department of Neurology

## 2022-06-08 ENCOUNTER — Encounter (INDEPENDENT_AMBULATORY_CARE_PROVIDER_SITE_OTHER): Payer: Self-pay | Admitting: Nurse Practitioner

## 2022-06-18 ENCOUNTER — Encounter (INDEPENDENT_AMBULATORY_CARE_PROVIDER_SITE_OTHER): Payer: Self-pay

## 2022-06-26 ENCOUNTER — Other Ambulatory Visit (INDEPENDENT_AMBULATORY_CARE_PROVIDER_SITE_OTHER): Payer: Self-pay | Admitting: Neurology

## 2022-06-26 MED ORDER — CLONAZEPAM 1 MG TABLET
1.0000 mg | ORAL_TABLET | Freq: Every evening | ORAL | 5 refills | Status: DC
Start: 2022-06-26 — End: 2022-12-16

## 2022-07-06 ENCOUNTER — Ambulatory Visit (INDEPENDENT_AMBULATORY_CARE_PROVIDER_SITE_OTHER): Payer: Self-pay | Admitting: NEUROLOGY

## 2022-07-27 ENCOUNTER — Ambulatory Visit: Payer: 59 | Attending: Nurse Practitioner | Admitting: Nurse Practitioner

## 2022-07-27 ENCOUNTER — Other Ambulatory Visit: Payer: Self-pay

## 2022-07-27 ENCOUNTER — Encounter (INDEPENDENT_AMBULATORY_CARE_PROVIDER_SITE_OTHER): Payer: Self-pay | Admitting: Nurse Practitioner

## 2022-07-27 VITALS — BP 136/66 | HR 107 | Temp 96.7°F | Ht 65.0 in | Wt 213.6 lb

## 2022-07-27 DIAGNOSIS — G8929 Other chronic pain: Secondary | ICD-10-CM | POA: Insufficient documentation

## 2022-07-27 DIAGNOSIS — R262 Difficulty in walking, not elsewhere classified: Secondary | ICD-10-CM

## 2022-07-27 DIAGNOSIS — R270 Ataxia, unspecified: Secondary | ICD-10-CM

## 2022-07-27 DIAGNOSIS — R4589 Other symptoms and signs involving emotional state: Secondary | ICD-10-CM

## 2022-07-27 DIAGNOSIS — G319 Degenerative disease of nervous system, unspecified: Secondary | ICD-10-CM | POA: Insufficient documentation

## 2022-07-27 DIAGNOSIS — G3189 Other specified degenerative diseases of nervous system: Secondary | ICD-10-CM

## 2022-07-27 DIAGNOSIS — R519 Headache, unspecified: Secondary | ICD-10-CM

## 2022-07-27 DIAGNOSIS — F3289 Other specified depressive episodes: Secondary | ICD-10-CM

## 2022-07-27 NOTE — Progress Notes (Unsigned)
[facility]  Multiple Sclerosis and Clinical Neuroimmunology Clinic    Date:  07/27/2022  Name: [name]  Age:  52 y.o.  Referring Physician:   Ancil Boozer, MD  [address] [place] [address]    History of Present Illness:  History obtained from:  patient and spouse spouse provides some history.     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 Progressively worsening.  -01/2022: Admitted for further workup including LP and likely trial of IVIG and steroids. She declined all three and left the hospital    Pertinent studies:  --MRI brain 11/2021: Per personal review on synapse. There is significant cerebellar atrophy with diffuse hyperintensity on Flair. Review of prior MRI in 05/2021 reveals similar atrophy of the cerebellum.   --CT chest/abd/pelvis 01/2022 (outside): Read as without signs of malignancy  --MRI 02/16/22: Predominantly cerebellar parenchymal volume loss. Otherwise unremarkable supratentorial brain. No acute intracranial process.  I personally reviewed this MRI - I agree that there is cerebellar volume loss. I also do still think there are FLAIR changes in her cerebellum and bilateral cerebellar peduncles.  --Paraneoplastic panel ([facility]) negative in 12/2021. Vitamin E normal.  --*Multiple labs in 03/2022 available in Epic  ------------------------    [name] is a 52 y.o. female who presents with a chief concern of   Chief Complaint   Patient presents with    Follow Up      Since Last Visit:  - Mood and headaches are greatly improved.   - However,  balance, perception, and gait changes persist.    - Just "about the same." Pt states.     - Walking with cane, but going down stairs specifically is very difficult for pt.  - D/C'd vistaril and switched back to Klonopin due to nightmares and moving extremities.  Sleeping better now - patient does not realize she is doing anything. But husband and daughter report still screaming and moving in sleep.   - IVIG - 1st infusion since 10/23 was received 07/03/22 - no notable difference at this point.   - No PT since d/c.     Past Medical History:   Diagnosis Date    MDD (major depressive disorder)     Restless leg syndrome      History reviewed. No past surgical history pertinent negatives.    Current Outpatient Medications   Medication Sig    amitriptyline (ELAVIL) 10 mg Oral Tablet Take 1 Tablet (10 mg total) by mouth Every night    clonazePAM (KLONOPIN) 1 mg Oral Tablet Take 1 Tablet (1 mg total) by mouth Every night    dexAMETHasone (DECADRON) 4 mg Oral Tablet Take 1.5 Tablets (6 mg total) by mouth Once a day 3/d x 1d, 2/d x 1d, 1/d x 1d (Patient not taking: Reported on 07/27/2022)    ferrous sulfate (FERATAB) 324 mg (65 mg iron) Oral Tablet, Delayed Release (E.C.) Take 1 Tablet (324 mg total) by mouth Every other day    folic acid (FOLVITE) 1 mg Oral Tablet Take 1 Tablet (1 mg total) by mouth Once a day  prochlorperazine (COMPAZINE) 10 mg Oral Tablet Take 1 Tablet (10 mg total) by mouth Every 6 hours as needed (Nausea/headache)    sertraline (ZOLOFT) 100 mg Oral Tablet Take 1 Tablet (100 mg total) by mouth Once a day    SUMAtriptan (IMITREX) 50 mg Oral Tablet Take 1 Tablet (50 mg total) by mouth Once, as needed for Migraine May repeat in 2 hours in needed     Allergies   Allergen Reactions    Penicillins Itching, Rash and Swelling     Family Medical History:    None         Social History     Socioeconomic History    Marital status: Married   Occupational History    Occupation: Energy manager   Tobacco  Use    Smoking status: Never    Smokeless tobacco: Never   Vaping Use    Vaping Use: Never used   Substance and Sexual Activity    Alcohol use: Never    Drug use: Never     Social Determinants of Health     Financial Resource Strain: Low Risk  (04/03/2022)    Financial Resource Strain     SDOH Financial: No   Transportation Needs: Low Risk  (04/03/2022)    Transportation Needs     SDOH Transportation: No   Social Connections: Medium Risk (04/03/2022)    Social Connections     SDOH Social Isolation: 3 to 5 times a week   Housing Stability: Low Risk  (04/03/2022)    Housing Stability     SDOH Housing Situation: I have housing.     SDOH Housing Worry: No       Review of Systems  Gen: No malaise. ID: No infections. HEENT: No hearing or vision changes. Respiratory: No shortness of breath or cough. Cardiac: No chest pain. GU: No hematuria. GI: No GI bleeding. Extremities: No peripheral edema. Musculoskeletal: No joint pain, swelling. Neuro: As above. Psychiatric: No mood changes.    Examination:    Vitals: BP 136/66   Pulse (!) 107   Temp (!) 35.9 C (96.7 F)   Ht 1.651 m (5\' 5" )   Wt 96.9 kg (213 lb 10 oz)   SpO2 98%   BMI 35.55 kg/m       General Exam:  General: No acute distress.  HEENT: No scleral icterus.  Extremities: No peripheral edema.  Psychiatric: Affect calm.    Neurologic Exam:  Orientation: Awake, alert, appropriately oriented.  Memory: Recent and remote memory appear intact. Formal memory testing not completed today.  Attention: Normal in conversation.  Knowledge: Appropriate.  Language: Fluent with intact comprehension.  Speech: Normal.  Cranial nerves: Pupils are equal and reactive bilaterally. Extraocular movements are intact. Facial sensation intact. Face activates symmetrically. Hearing intact to finger rub bilaterally. Shoulder shrug 5/5 bilaterally. Tongue and uvula midline.   Sensory: Intact to light touch, vibration, and proprioception throughout.  Muscle tone: Normal.    Muscle exam:  Arm  Right Left Leg Right Left   Deltoid 5/5 5/5 Iliopsoas 5/5 5/5   Biceps 5/5 5/5 Quads 5/5 5/5   Triceps 5/5 5/5 Hamstrings 5/5 5/5   Interossei 5/5 5/5 Ankle Dorsi Flexion 5/5 5/5   APB 5/5 5/5 Ankle Plantar Flexion 5/5 5/5     Reflexes:   RJ BJ TJ KJ AJ    Right 2+ 3+ 2+ 4+ 2+    Left 2+ 3+ 2+ 4+ 2+      Coordination: Finger-nose  with dysmetria bilaterally - this more prominent with eye open,  no dysmetria with eyes closed.  Finger taps normal.  Truncal ataxia noted with arms extended.  Gait: Ataxic gait - wide based, toes pointed outward.  Shuffling at times.  Slightly leaned forward.  Assisted by cane.  Difficult from sitting to standing.     Assessment and Plan:    Assessment/Plan   1. Neurodegenerative disorder (CMS HCC)    2. Ataxia    3. Chronic headaches    4. Depressed mood    5. Difficulty walking down stairs    6. Difficulty walking    Cerebellar degeneration, ? Autoimmune etiology.  Patient reports her mood and headaches are greatly improved since last visit.  However, difficulty walking and depth perception issues remain.  Truncal ataxia noted on exam, and ataxic gait assisted by cane.  Patient received her first IVIG infusion since 10/23 discharge last month.  Advised patient that it may take up to 3 infusions to see more benefit from this.  She understands.    Discussed PT for gait changes - she is agreeable.  Order given.   Discussed with Dr. Leonides Schanz, would like to get back to see Dr. Velna Hatchet for exam and her thoughts.  Message sent, and we will work to help set up.    RTC 6 months, but can change follow plan if pt status changes.     No orders of the defined types were placed in this encounter.    Return in about 6 months (around 01/25/2023).     On the day of the encounter, a total of 39 minutes was spent on patient encounter including review of historical information, examination, documentation, and post visit activities.     Rozell Searing, APRN 07/27/2022  Advance Practice Provider, Department of  Neurology

## 2022-09-05 ENCOUNTER — Encounter (INDEPENDENT_AMBULATORY_CARE_PROVIDER_SITE_OTHER): Payer: Self-pay | Admitting: Neurology

## 2022-10-02 ENCOUNTER — Encounter (INDEPENDENT_AMBULATORY_CARE_PROVIDER_SITE_OTHER): Payer: Self-pay | Admitting: Neurology

## 2022-10-02 ENCOUNTER — Encounter (INDEPENDENT_AMBULATORY_CARE_PROVIDER_SITE_OTHER): Payer: Self-pay

## 2022-11-18 ENCOUNTER — Other Ambulatory Visit (INDEPENDENT_AMBULATORY_CARE_PROVIDER_SITE_OTHER): Payer: Self-pay | Admitting: Nurse Practitioner

## 2022-11-18 ENCOUNTER — Other Ambulatory Visit (INDEPENDENT_AMBULATORY_CARE_PROVIDER_SITE_OTHER): Payer: Self-pay | Admitting: Neurology

## 2022-11-18 ENCOUNTER — Encounter (INDEPENDENT_AMBULATORY_CARE_PROVIDER_SITE_OTHER): Payer: Self-pay | Admitting: Neurology

## 2022-11-18 MED ORDER — PROCHLORPERAZINE MALEATE 10 MG TABLET
10.00 mg | ORAL_TABLET | Freq: Four times a day (QID) | ORAL | 5 refills | Status: AC | PRN
Start: 2022-11-18 — End: ?

## 2022-11-18 MED ORDER — SERTRALINE 100 MG TABLET
100.0000 mg | ORAL_TABLET | Freq: Every day | ORAL | 5 refills | Status: DC
Start: 2022-11-18 — End: 2023-06-04

## 2022-11-18 MED ORDER — HYDROXYZINE PAMOATE 25 MG CAPSULE
25.0000 mg | ORAL_CAPSULE | Freq: Three times a day (TID) | ORAL | 5 refills | Status: DC | PRN
Start: 2022-11-18 — End: 2023-05-17

## 2022-11-18 NOTE — Telephone Encounter (Signed)
From: [name]  To: Melanie Ward  Sent: 11/18/2022 10:22 AM EDT  Subject: Hydroxyzine Pam    I don't have any refills and it's not listed on my meds to request a refill.

## 2022-11-18 NOTE — Telephone Encounter (Signed)
Please review and erx  I'm sorry if i already sent this to you.

## 2022-12-02 ENCOUNTER — Encounter (INDEPENDENT_AMBULATORY_CARE_PROVIDER_SITE_OTHER): Payer: Self-pay | Admitting: Neurology

## 2022-12-08 ENCOUNTER — Other Ambulatory Visit: Payer: Self-pay

## 2022-12-08 ENCOUNTER — Encounter (INDEPENDENT_AMBULATORY_CARE_PROVIDER_SITE_OTHER): Payer: Self-pay | Admitting: Neurology

## 2022-12-08 ENCOUNTER — Ambulatory Visit: Payer: 59 | Attending: Neurology | Admitting: Neurology

## 2022-12-08 ENCOUNTER — Ambulatory Visit (HOSPITAL_BASED_OUTPATIENT_CLINIC_OR_DEPARTMENT_OTHER): Payer: 59 | Admitting: Neurology

## 2022-12-08 VITALS — BP 140/71 | HR 92 | Temp 96.4°F | Ht 65.0 in | Wt 222.0 lb

## 2022-12-08 VITALS — BP 108/73 | HR 96 | Temp 97.8°F | Wt 222.1 lb

## 2022-12-08 DIAGNOSIS — G3189 Other specified degenerative diseases of nervous system: Secondary | ICD-10-CM

## 2022-12-08 DIAGNOSIS — R2689 Other abnormalities of gait and mobility: Secondary | ICD-10-CM | POA: Insufficient documentation

## 2022-12-08 DIAGNOSIS — G319 Degenerative disease of nervous system, unspecified: Secondary | ICD-10-CM | POA: Insufficient documentation

## 2022-12-08 IMAGING — MR BRAIN WO
8 of 10 series · 34 of 48 positions shown · IV contrast (gadolinium)
Comparison: None available.

﻿EXAM:  MRI BRAIN W/O CONTRAST
INDICATION: Change in mental status and gait abnormality.
TECHNIQUE: Multiplanar, multisequential MRI of the brain was performed without gadolinium contrast.

[Series 6: DWI · axial · 5.0mm · 1.35mm/px · z∈[-62,+64]mm · 11 of 88 slices shown (1 of 3)]
[im 1/88]
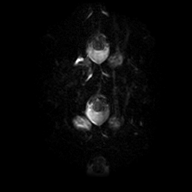
[im 9/88]
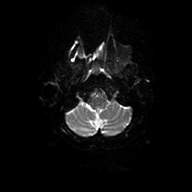
[im 18/88]
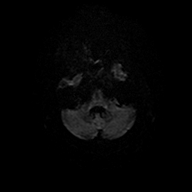
[im 27/88]
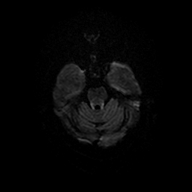
[im 35/88]
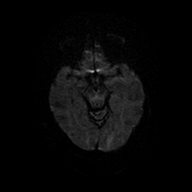
[im 44/88]
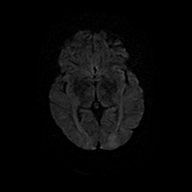
[im 53/88]
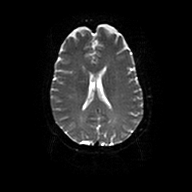
[im 61/88]
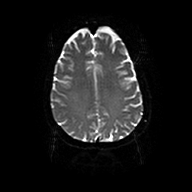
[im 70/88]
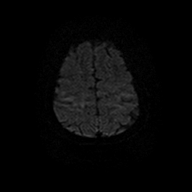
[im 79/88]
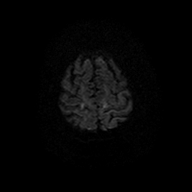
[im 88/88]
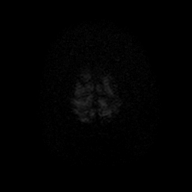

[Series 7: DWI · axial · 5.0mm · 1.35mm/px · z∈[-62,+64]mm · 2 of 22 slices shown (2 of 3)]
[im 1/22]
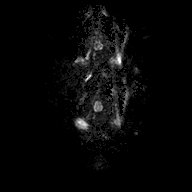
[im 22/22]
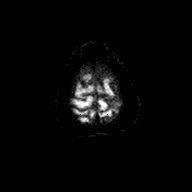

[Series 8: DWI · axial · 5.0mm · 1.35mm/px · z∈[-62,+64]mm · 3 of 22 slices shown (3 of 3)]
[im 1/22]
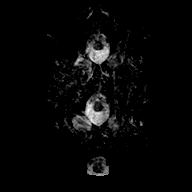
[im 11/22]
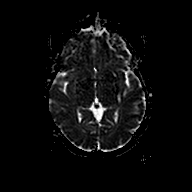
[im 22/22]
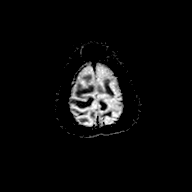

[Series 9: FLAIR · sagittal · 4.0mm · 0.75mm/px · 3 of 26 slices shown (1 of 2)]
[im 1/26]
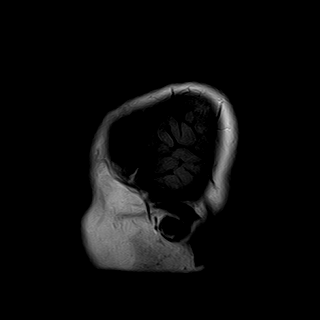
[im 13/26]
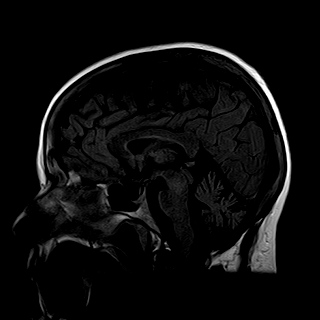
[im 26/26]
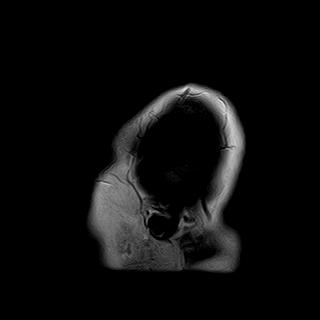

[Series 12: T2 · coronal · 5.0mm · 0.43mm/px · 4 of 28 slices shown (1 of 2)]
[im 1/28]
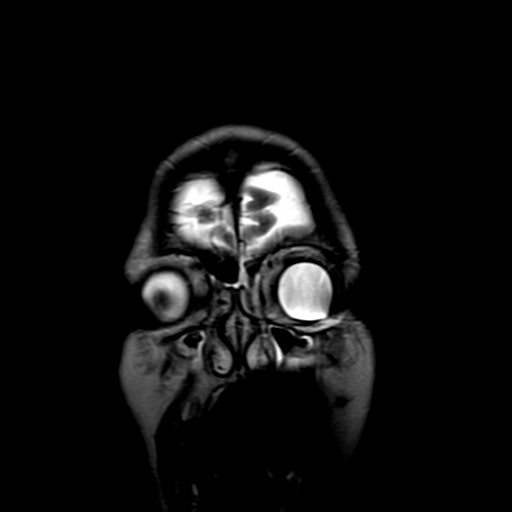
[im 10/28]
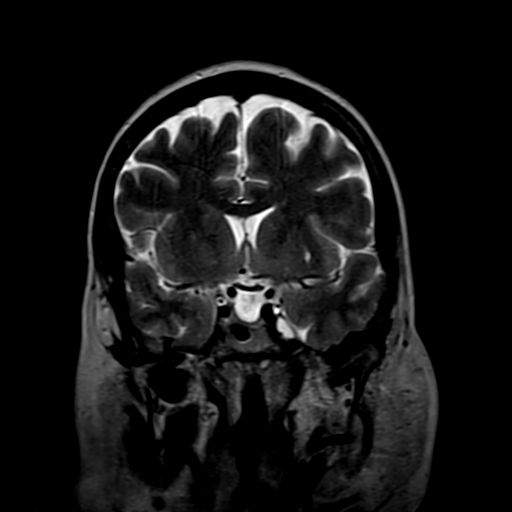
[im 19/28]
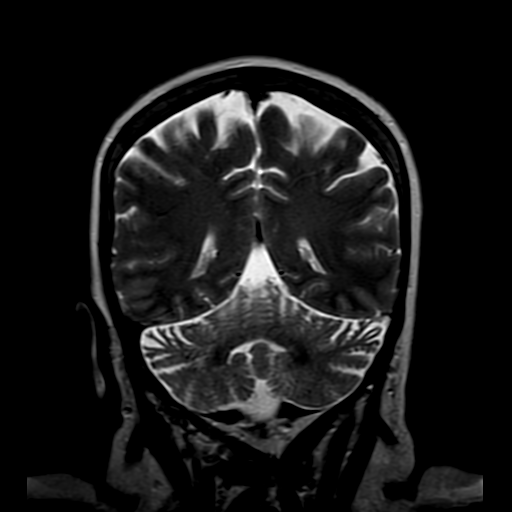
[im 28/28]
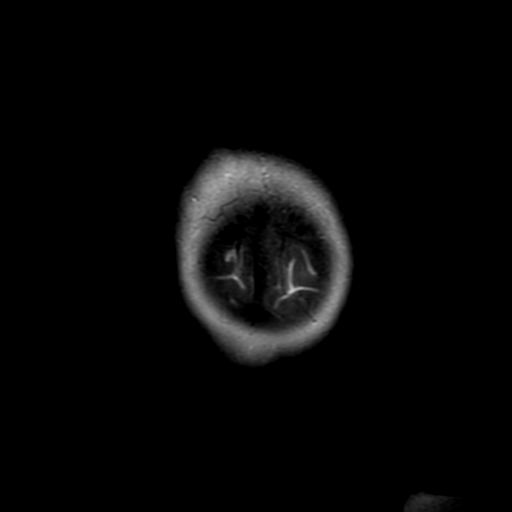

[Series 13: T2 · axial · 4.0mm · 0.43mm/px · z∈[-60,+80]mm · 5 of 36 slices shown (2 of 2)]
[im 1/36]
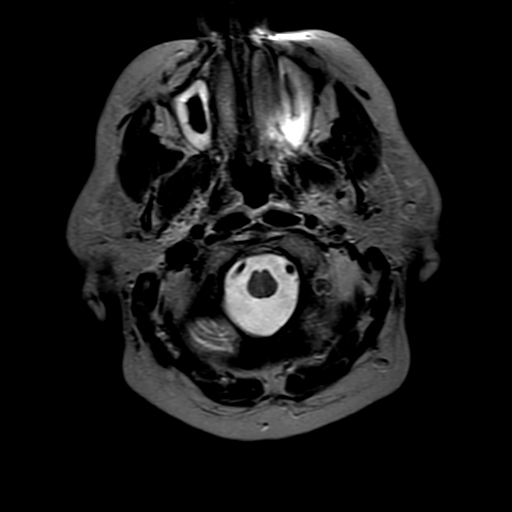
[im 9/36]
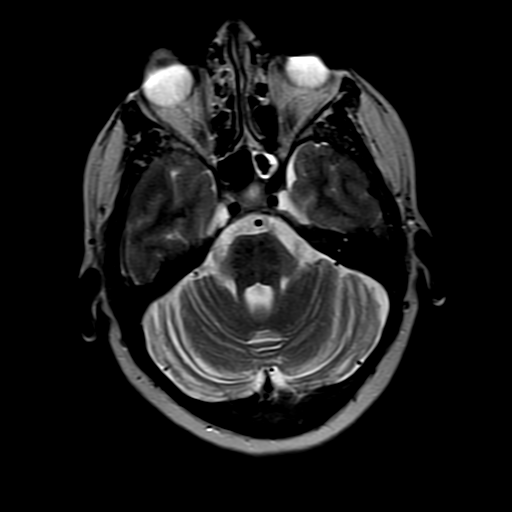
[im 18/36]
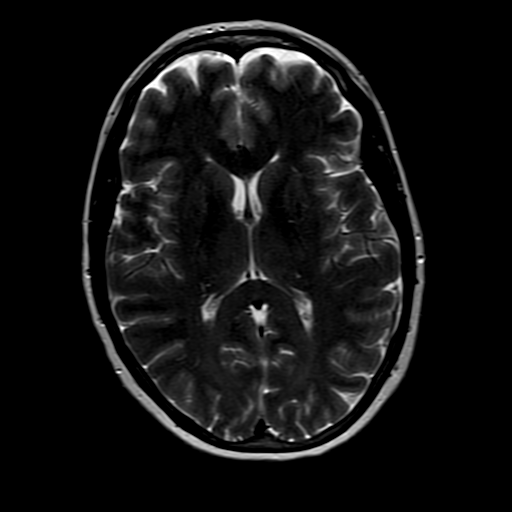
[im 27/36]
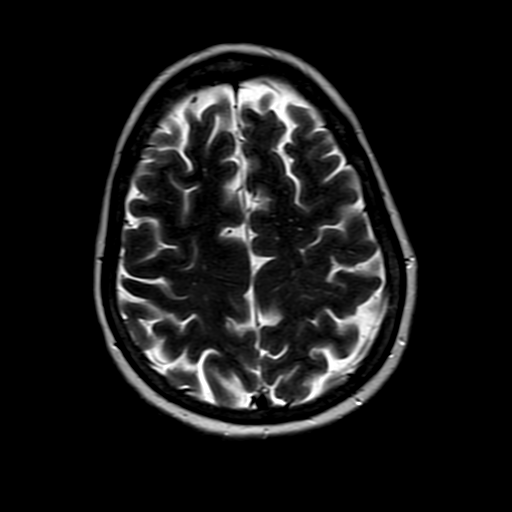
[im 36/36]
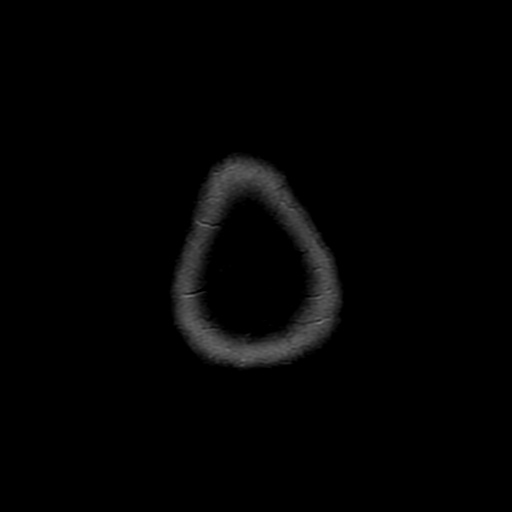

[Series 14: FLAIR · axial · 4.0mm · 0.43mm/px · z∈[-60,+80]mm · 5 of 36 slices shown (2 of 2)]
[im 1/36]
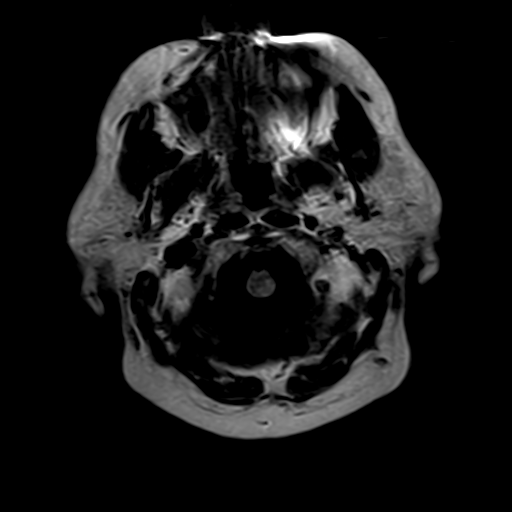
[im 9/36]
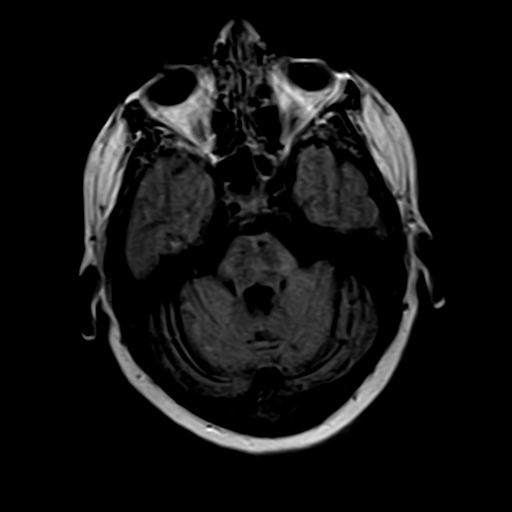
[im 18/36]
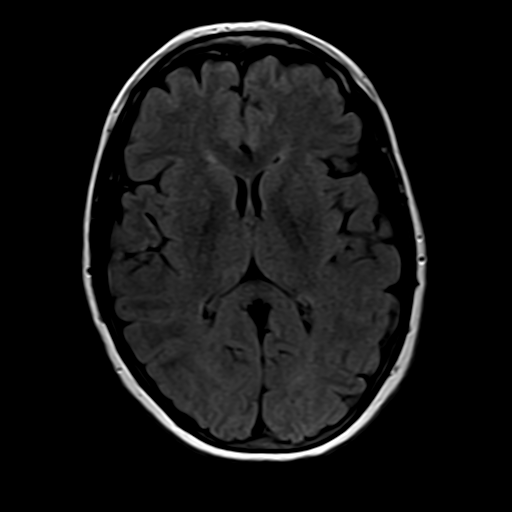
[im 27/36]
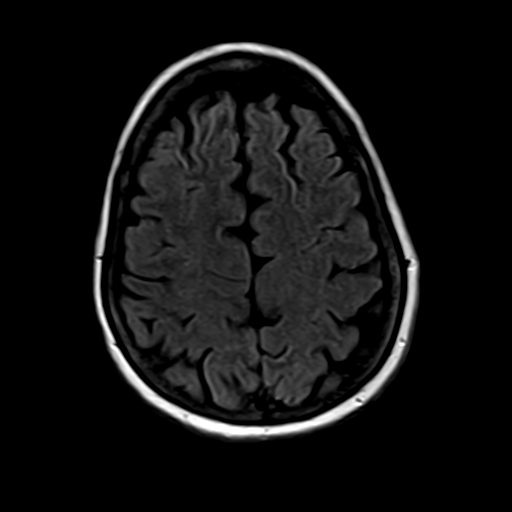
[im 36/36]
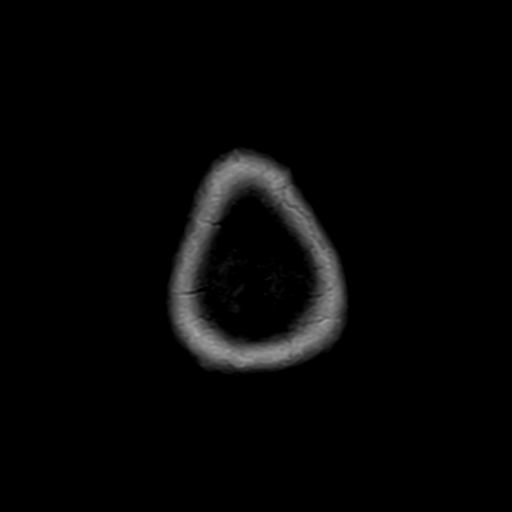

[Series 15: T1 · axial · 4.0mm · 0.43mm/px · 1 of 36 slices shown]
[im 1/36]
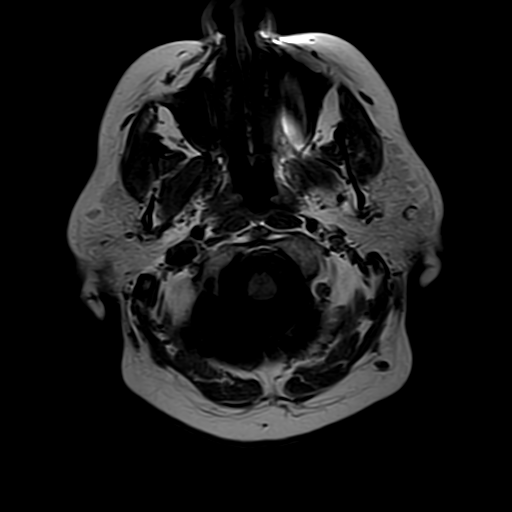

[34 of 48 positions shown; findings below may reference images not displayed]

FINDINGS: Ventricular and sulcal size is normal for the patient's age. There is no mass effect, midline shift or intracranial hemorrhage. There is no evidence of acute infarction or prior microhemorrhages. Skull base flow voids and basal cisterns are patent. Sagittal survey of midline structures is unremarkable. There are no extra-axial fluid collections. There is mucoperiosteal thickening within most paranasal sinuses. There is also partial opacification of the right mastoid air cells. Orbital contents are unremarkable.
IMPRESSION: Unremarkable exam.

## 2022-12-08 MED ORDER — DIAZEPAM 5 MG TABLET
ORAL_TABLET | ORAL | 0 refills | Status: DC
Start: 2022-12-08 — End: 2023-04-24

## 2022-12-08 NOTE — Progress Notes (Signed)
[name], [facility]  [address]  [place] [address]  Operated by [facility]  Progress Note    Name: [name] MRN:  [id number]   Date: 12/08/2022 DOB:  October 02, 1970 (52 y.o.)     Referring Provider:   No referring provider defined for this encounter.      CC: ataxia     Subjective:   She has been a lot beet emotionally. She is still using assistance down the stairs. Unable to drive on the 2 lane road but unable to drive on 4 lane. She is on IVIG. Had one fall 2 weeks ago from as yncopal epidoes. Hand writing it still terrible. She is exercising at home. Has not done PT.     Historically:  White female who presents as a direct admit from Dr. 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 Patient reports progressive gait impairment since January 2022. She has been having issues with going up steps and has had to move her bedroom to the first floor. She has not been upstairs in her house in over 1 year. She reports 3 falls in the past year. Her most recent fall was yesterday. She tried to get up off of the sofa and fell onto the ground onto her right shoulder. She has been using a walker at home. She also reports progressive weakness in her arms and feels like she has to pause and take a break while brushing her hair. She states that she has issues with saying a full sentence and feels like she has to take a breath in between words. She denies diplopia and ptosis. She reports difficulty with swallowing for many years but this has not worsened in the past few years.      Per chart review: Patient has been having gait impairment, falls, impaired spatial judgment and depth perception, cognitive decline, and mood changes for the past 1.5 years. Patient was initially direct admitted in August 2023 for LP and trial of IVIG and steroids, but she declined and left the hospital. Patient contacted Dr. Elesa Cross on 03/31/22 asking for direct  admission for IVIG due to her symptoms persisting.      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 She has not been driving since June 4782.     BRIEF HOSPITAL NARRATIVE:   Patient declined LP as she was unable to tolerate it during her last admission. Offered flouro LP but she continued to decline.  Pt received IVIG 400mg /kg x 5 days from 10/13-10/17.  Ferritin was low at 57, so started iron supplementation. Folate was low at 6, so started folate supplementation.    Objective:   Vital Signs:  BP 108/73   Pulse 96   Temp 36.6 C (97.8 F)   Wt 101 kg (222 lb 1.6 oz)   SpO2 95%   BMI 36.96 kg/m       General: appears in good health  Ophthalomscopic: normal w/o hemorrhages, exudates, or papilledema  Orientation: Alert and oriented x 3  Memory: Registration, Recall, and Following of commands is normal  Attention: Attention and Concentration are normal  Knowledge: Good  Language: Normal  Speech: Normal  Cranial nerves: Cranial nerves 2-12 are normal  Gait: Normal, requires some assistance   Coordination: Coordination is normal without tremor. Subtle ataxia R>L with FTN  Sensory: Sensory exam in the  upper and lower extremities is normal  Muscle tone: Upper and lower muscle tone is normal  Motor strength:  Motor strength is normal throughout.  Reflexes: Reflexes are 2/2 throughout    Data Reviewed:    MRI brain w/wo 02/16/22: cerebellar atrophy     Assessment and Plan:    Assessment/Plan   1. Balance disorder      Orders Placed This Encounter    Refer to Physical Therapy-External     Ms. 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 She has overall improved but continues to have subtle ataxia and balance difficulty.     -Physical therapy  -monitor BP at home. Follow up with PCP  -continue to follow with Dr. Elesa Cross     Total face-to-face time by staff:    minutes.  Greater than 50% of that time was spent on counseling/coordination of care regarding:      Lindsey Noe, MD 12/08/2022, 21:14          I saw and examined the patient.  I reviewed the resident's note.  I agree with the findings and plan of care as documented in the resident's note.  Any exceptions/additions are edited/noted.    Overall patient has noticed improvement since being on the IVIG. Mood is more stable. Still has a lot of fall anxiety, although she likely could do tandem gait but is unable to do so due to anxiety at this time. Has some subtle ataxia with FNF but otherwise neurologic exam looks stable. Will continue IVIG treatments every 3 weeks and PT script provided. RTC PRN.     On the day of the encounter, a total of 30 minutes was spent on this patient encounter, including review of historical information, examination, documentation, and post-visit activities.     Lindsey Lango, MD  Movement Disorders  Assistant Professor of Neurology

## 2022-12-08 NOTE — Progress Notes (Signed)
[facility]      Date:  12/08/2022  Name: [name]  Age:  52 y.o.    History of Present Illness:  History obtained from:  patient      [name] is a 52 y.o. 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 Progressively worsening.  -01/2022: Admitted for further workup including LP and likely trial of IVIG and steroids. She declined all three and left the hospital    Pertinent studies:  --MRI brain 11/2021: Per personal review on synapse. There is significant cerebellar atrophy with diffuse hyperintensity on Flair. Review of prior MRI in 05/2021 reveals similar atrophy of the cerebellum.   --CT chest/abd/pelvis 01/2022 (outside): Read as without signs of malignancy  --MRI 02/16/22: Predominantly cerebellar parenchymal volume loss. Otherwise unremarkable supratentorial brain. No acute intracranial process.  I personally reviewed this MRI - I agree that there is cerebellar volume loss. I also do still think there are FLAIR changes in her cerebellum and bilateral cerebellar peduncles.  --Paraneoplastic panel ([facility]) negative in 12/2021. Vitamin E normal.  --Multiple labs in 03/2022 available in Epic    ------------------------    Since last visit:  -Getting IVIG, helping somewhat but real short of breath now - just started the q3 week dosing, has gotten two doses, seems to be helping more. Previously noted worsening symptoms about a week  before her next dose.  -Mood is better.  -Speech is not as slurred as it used to be.  -Thinking is great.  -Has helped with walking  -Shortness of breath has been coming and going previously but is worse now  -Still very fatigued, has to slow her daily activities  -Talking is hard from a breathing standpoint  -Coordination is still an issue, headaches are better  -Joints have been hurting/burning  -Has had some falls, hurt her tailbone, better now  -No diplopia    -Still not driving or going on steps by herself  -Handwriting still not good, but has been practicing her handwriting    -Doing some driving    Past Medical History:   Diagnosis Date    MDD (major depressive disorder)     Restless leg syndrome        Current Outpatient Medications   Medication Sig    amitriptyline (ELAVIL) 10 mg Oral Tablet Take 1 Tablet (10 mg total) by mouth Every night    clonazePAM (KLONOPIN) 1 mg Oral Tablet Take 1 Tablet (1 mg total) by mouth Every night    diazePAM (VALIUM) 5 mg Oral Tablet Take 1 tab prior to MRI. Can take additional tab as needed.    hydrOXYzine pamoate (VISTARIL) 25 mg Oral Capsule Take 1 Capsule (25 mg total) by mouth Three times a day as needed for Itching 1-2 q8h prn headache    prochlorperazine (COMPAZINE) 10 mg Oral Tablet Take 1 Tablet (10 mg total) by mouth Every 6 hours as  needed (Nausea/headache)    sertraline (ZOLOFT) 100 mg Oral Tablet Take 1 Tablet (100 mg total) by mouth Once a day    SUMAtriptan (IMITREX) 50 mg Oral Tablet Take 1 Tablet (50 mg total) by mouth Once, as needed for Migraine May repeat in 2 hours in needed     Allergies   Allergen Reactions    Penicillins Itching, Rash and Swelling     Family Medical History:    None         Social History     Socioeconomic History    Marital status: Married   Occupational History    Occupation: Ecologist   Tobacco Use    Smoking status: Never    Smokeless tobacco: Never   Vaping Use    Vaping status: Never Used   Substance and  Sexual Activity    Alcohol use: Never    Drug use: Never     Social Determinants of Health     Financial Resource Strain: Low Risk  (04/03/2022)    Financial Resource Strain     SDOH Financial: No   Transportation Needs: Low Risk  (04/03/2022)    Transportation Needs     SDOH Transportation: No   Social Connections: Medium Risk (04/03/2022)    Social Connections     SDOH Social Isolation: 3 to 5 times a week   Housing Stability: Low Risk  (04/03/2022)    Housing Stability     SDOH Housing Situation: I have housing.     SDOH Housing Worry: No       Review of Systems:  Other than above, no pertinent positives.    Examination:    Vitals: BP (!) 140/71   Pulse 92   Temp (!) 35.8 C (96.4 F)   Ht 1.651 m (5\' 5" )   Wt 101 kg (222 lb 0.1 oz)   SpO2 97%   BMI 36.94 kg/m       General Exam:  General: No acute distress.  HEENT: No scleral icterus.  Psychiatric: Affect calm.    Neurologic Exam:  Orientation: Awake, alert, appropriately oriented.  Memory: Recent and remote memory appear intact. Formal memory testing not completed today.  Attention: Normal in conversation.  Knowledge: Appropriate.  Language: Fluent with intact comprehension.  Speech: Normal.  Cranial nerves: Face symmetric.  Muscle tone: Normal.    Muscle exam:  Arm Right Left Leg Right Left   Deltoid 5/5 5/5 Iliopsoas 5/5 5/5   Biceps 5/5 5/5 Quads 5/5 5/5   Triceps 5/5 5/5 Hamstrings 5/5 5/5   Interossei 5/5 5/5 Ankle Dorsi Flexion 5/5 5/5     Coordination: Finger-nose with mild ataxia on finger-nose bilaterally  Gait: Relatively steady with casual gait.    Assessment and Plan:  Assessment/Plan   1. Cerebellar degeneration (CMS HCC)      Overall significantly improved from initial presentation. Continue q3 week IVIG, can hopefully stretch back out to every 4 weeks within a few months. Discussed that she may not need to be on immunotherapy forever. Recommend updating brain MRI, provided Rx for valium for claustrophobia. Discussed that she may not fully  return to baseline. Will check ANA/rheumatoid factor for joint swelling. RTC in 3-6 months.    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    Rosalyn Gess, MD 12/08/2022, 10:53  Assistant Professor, Department of Neurology

## 2022-12-09 ENCOUNTER — Encounter (HOSPITAL_COMMUNITY): Payer: Self-pay

## 2022-12-09 NOTE — Addendum Note (Signed)
Addended by: Ferdinand Lango on: 12/09/2022 07:53 AM     Modules accepted: Level of Service

## 2022-12-11 ENCOUNTER — Encounter (INDEPENDENT_AMBULATORY_CARE_PROVIDER_SITE_OTHER): Payer: Self-pay | Admitting: Neurology

## 2022-12-13 ENCOUNTER — Encounter (INDEPENDENT_AMBULATORY_CARE_PROVIDER_SITE_OTHER): Payer: Self-pay | Admitting: Neurology

## 2022-12-14 ENCOUNTER — Encounter (INDEPENDENT_AMBULATORY_CARE_PROVIDER_SITE_OTHER): Payer: Self-pay | Admitting: Neurology

## 2022-12-16 ENCOUNTER — Other Ambulatory Visit (INDEPENDENT_AMBULATORY_CARE_PROVIDER_SITE_OTHER): Payer: Self-pay | Admitting: Neurology

## 2022-12-16 MED ORDER — CLONAZEPAM 1 MG TABLET
1.0000 mg | ORAL_TABLET | Freq: Every evening | ORAL | 5 refills | Status: DC
Start: 2022-12-16 — End: 2023-06-09

## 2022-12-17 ENCOUNTER — Encounter (INDEPENDENT_AMBULATORY_CARE_PROVIDER_SITE_OTHER): Payer: Self-pay | Admitting: Neurology

## 2022-12-17 ENCOUNTER — Other Ambulatory Visit (HOSPITAL_BASED_OUTPATIENT_CLINIC_OR_DEPARTMENT_OTHER): Payer: Self-pay | Admitting: NEUROLOGY

## 2022-12-17 DIAGNOSIS — R2689 Other abnormalities of gait and mobility: Secondary | ICD-10-CM

## 2022-12-17 IMAGING — MR MRI BRAIN WITHOUT AND WITH CONTRAST
11 series · 42 of 48 positions shown · IV contrast (gadavist)
Comparison: MRI dated 12/05/2021.

﻿EXAM:  17442   MRI BRAIN WITHOUT AND WITH CONTRAST
INDICATION: 52-year-old with history of autoimmune degenerative disorder of CNS undergoing treatment. Follow-up evaluation.
TECHNIQUE: MRI was performed including postcontrast series after administration of 5 mL Gadavist IV.

[Series 5: DWI · axial · 5.0mm · 1.35mm/px · z∈[-46,+80]mm · 8 of 88 slices shown (1 of 3)]
[im 1/88]
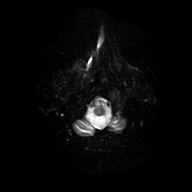
[im 14/88]
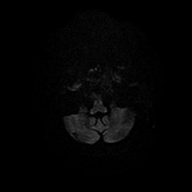
[im 27/88]
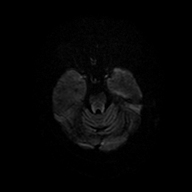
[im 41/88]
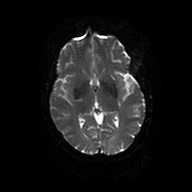
[im 47/88]
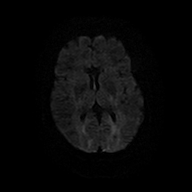
[im 61/88]
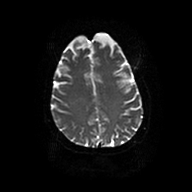
[im 74/88]
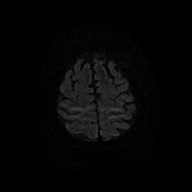
[im 88/88]
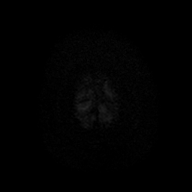

[Series 6: DWI · axial · 5.0mm · 1.35mm/px · z∈[-46,+80]mm · 3 of 22 slices shown (2 of 3)]
[im 1/22]
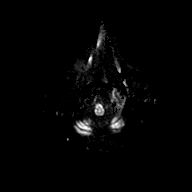
[im 11/22]
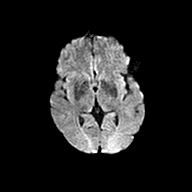
[im 22/22]
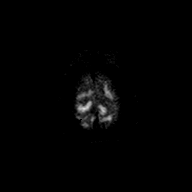

[Series 7: DWI · axial · 5.0mm · 1.35mm/px · z∈[-46,+80]mm · 3 of 21 slices shown (3 of 3)]
[im 1/21]
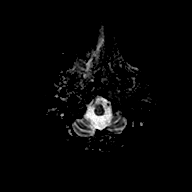
[im 11/21]
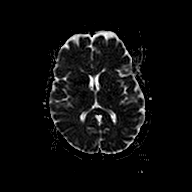
[im 21/21]
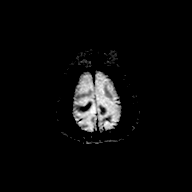

[Series 8: FLAIR · sagittal · 4.0mm · 0.75mm/px · 4 of 26 slices shown (1 of 2)]
[im 1/26]
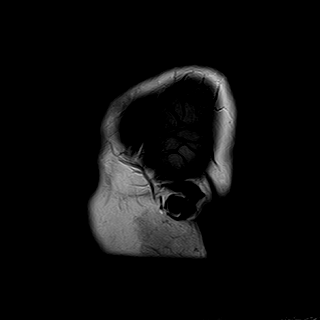
[im 9/26]
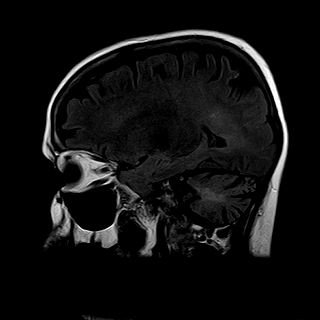
[im 17/26]
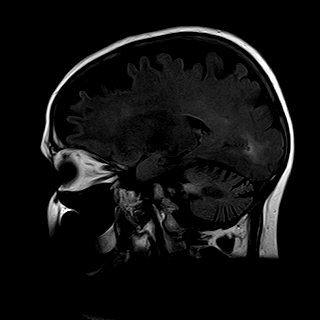
[im 26/26]
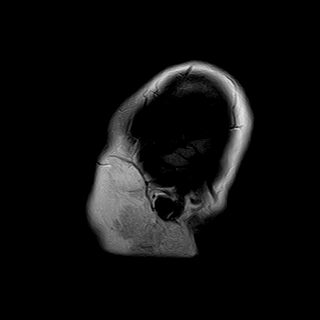

[Series 9: T2 · axial · 5.0mm · 0.43mm/px · z∈[-51,+87]mm · 4 of 24 slices shown (1 of 2)]
[im 1/24]
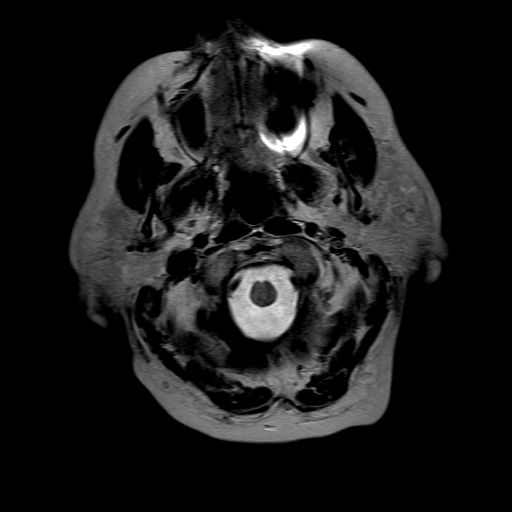
[im 8/24]
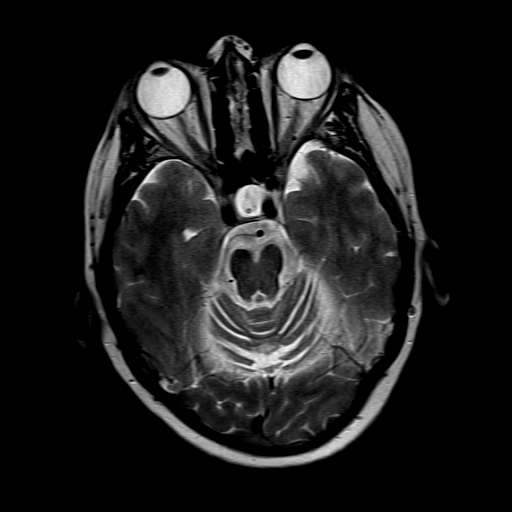
[im 16/24]
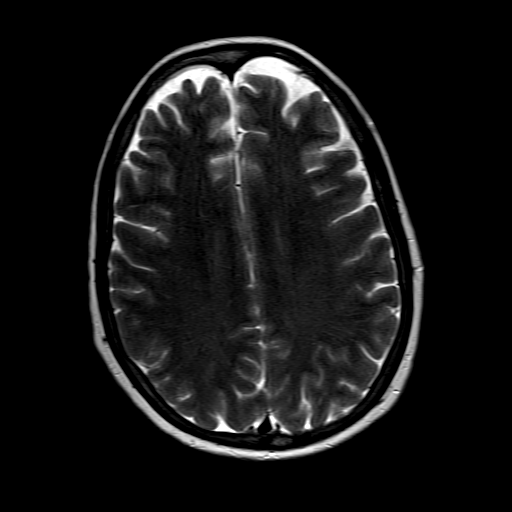
[im 24/24]
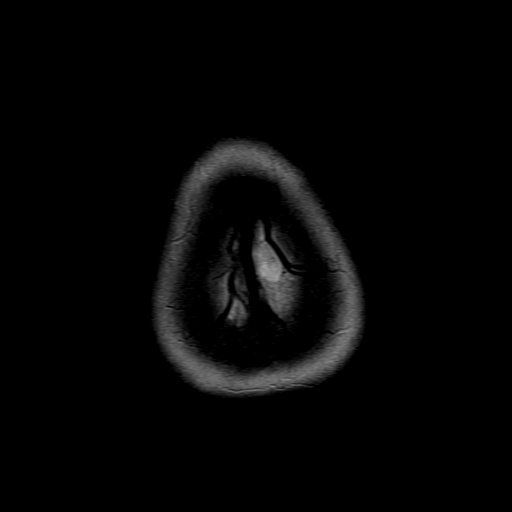

[Series 10: FLAIR · axial · 5.0mm · 0.76mm/px · z∈[-45,+81]mm · 3 of 22 slices shown (2 of 2)]
[im 1/22]
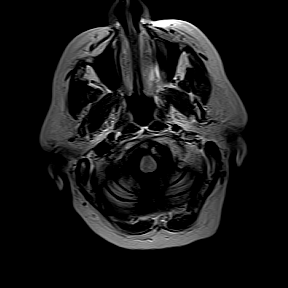
[im 11/22]
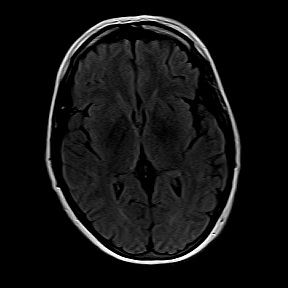
[im 22/22]
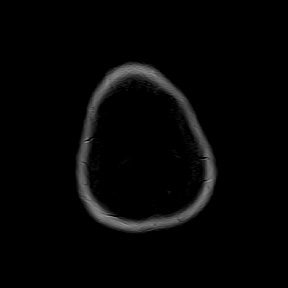

[Series 11: T1 · axial · 5.0mm · 0.69mm/px · z∈[-45,+81]mm · 3 of 22 slices shown (1 of 2)]
[im 1/22]
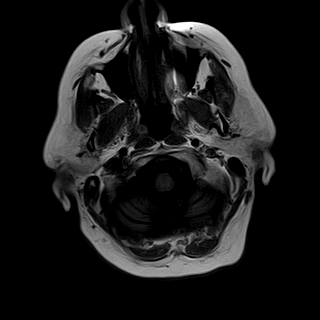
[im 11/22]
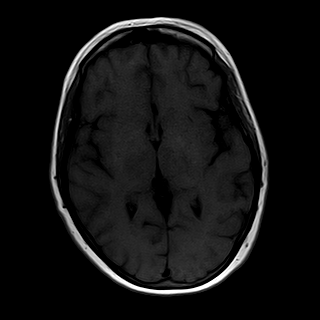
[im 22/22]
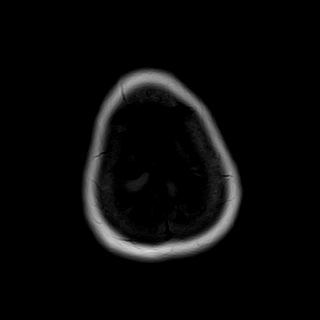

[Series 12: T2-star · axial · 5.0mm · 0.69mm/px · z∈[-45,+81]mm · 3 of 22 slices shown]
[im 1/22]
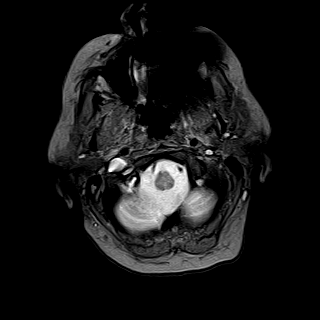
[im 11/22]
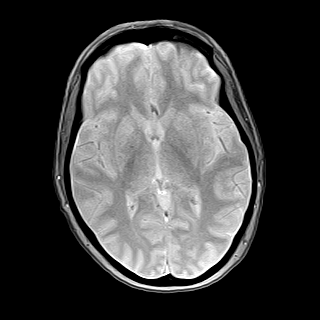
[im 22/22]
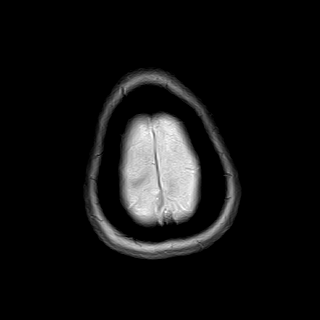

[Series 13: T2 · coronal · 6.0mm · 0.43mm/px · 4 of 24 slices shown (2 of 2)]
[im 1/24]
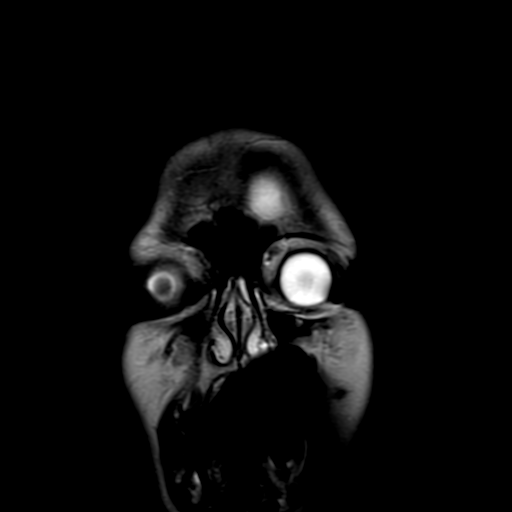
[im 8/24]
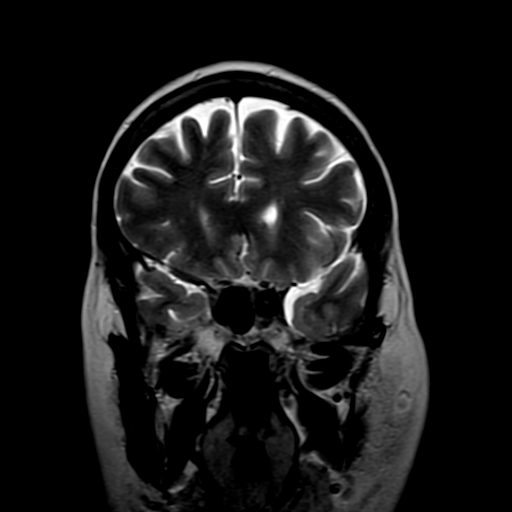
[im 16/24]
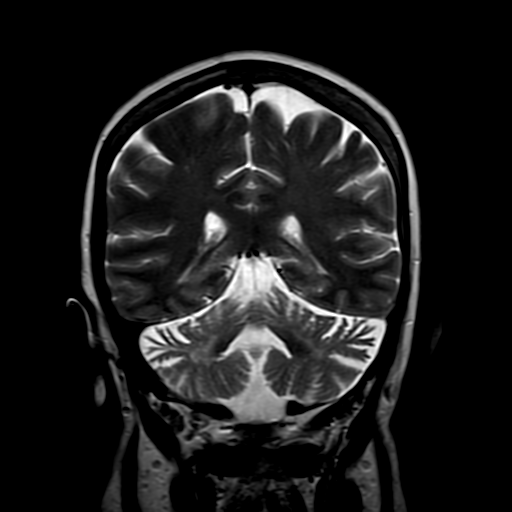
[im 24/24]
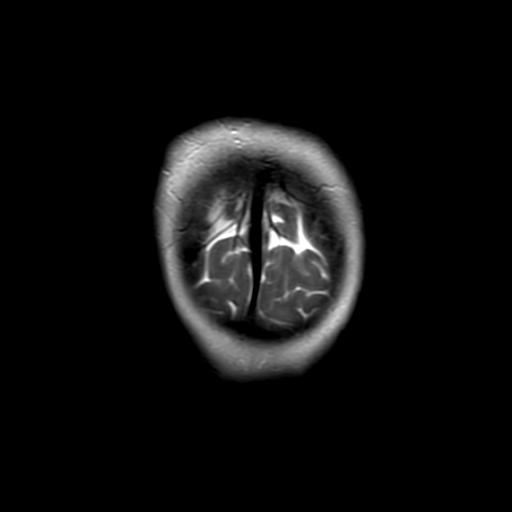

[Series 15: T1 fat-sat post-contrast · coronal · 5.0mm · 0.57mm/px · 4 of 24 slices shown]
[im 1/24]
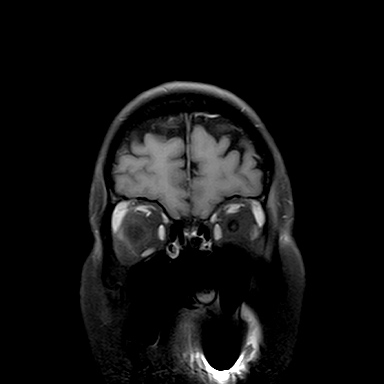
[im 8/24]
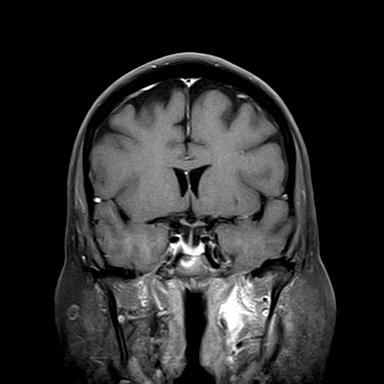
[im 16/24]
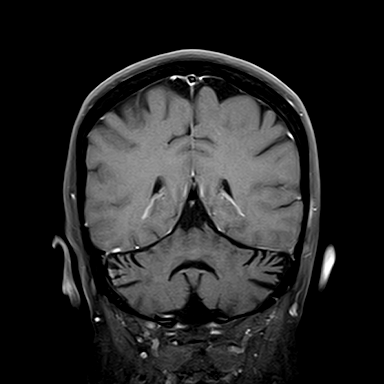
[im 24/24]
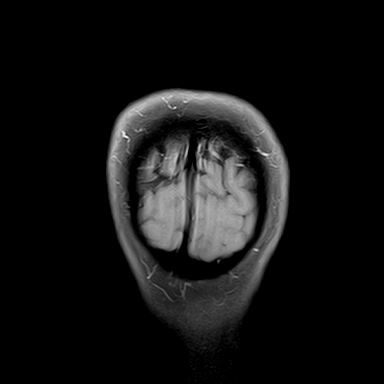

[Series 16: T1 · axial · 5.0mm · 0.69mm/px · z∈[-45,+81]mm · 3 of 22 slices shown (2 of 2)]
[im 1/22]
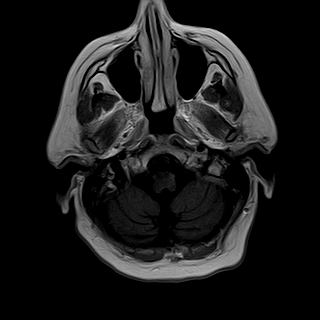
[im 11/22]
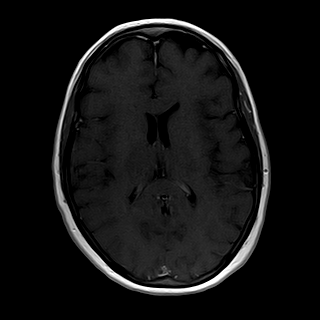
[im 22/22]
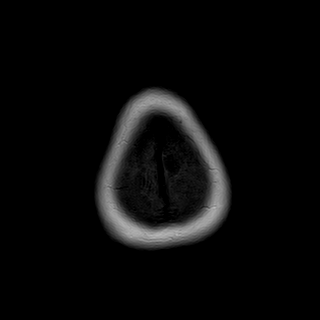

[42 of 48 positions shown; findings below may reference images not displayed]

FINDINGS: No focal areas of restricted diffusion are noted on the diffusion sequence.

No intracranial bleed or extra-axial collections are seen.  No ventriculomegaly or midline shift is seen.

Significant symmetric cerebellar atrophy is noted.  Extensive T2, FLAIR signal abnormality of the superior aspect of the cerebellum on both sides extending from the superior cerebellar peduncles are noted extending to pons.  The extent of these lesions are unchanged from MRI examination of 12/05/2021.  No abnormal enhancement is noted on the postcontrast examination.  Prominent size of the 4th ventricle is also unchanged from prior studies.

Major arteries of circle of Willis are patent.
IMPRESSION: 1. No significant focal supratentorial lesions other than mild symmetric cerebral cortical atrophy of the frontal lobes.

2. Extensive degenerative changes with significant symmetric cerebellar atrophy, FLAIR signal abnormalities of superior cerebellum on both sides extending to the cerebellar peduncles and to the pons are stable in appearance from previous examination of 12/05/2021. No abnormal enhancement on the postcontrast study.  

3. Overall, MRI findings are stable compared with 12/05/2021.

## 2022-12-17 IMAGING — MG 3D SCREENING MAMMO BIL AND TOMO
5 series · 7 of 24 positions shown · non-contrast
Comparison: Mammograms dated 02/22/2022, 08/24/2019.

------------- REPORT [id number] -------------
[facility]
[address]
[place]/MS/MOTO, URSZULA
We wish to report the following on your recent mammography examination. We are sending a report to your referring physician or other health care provider.
INDICATION: Screening mammogram.  Asymptomatic 52-year-old with no family history.  Lifetime breast cancer risk 10%.

[R]
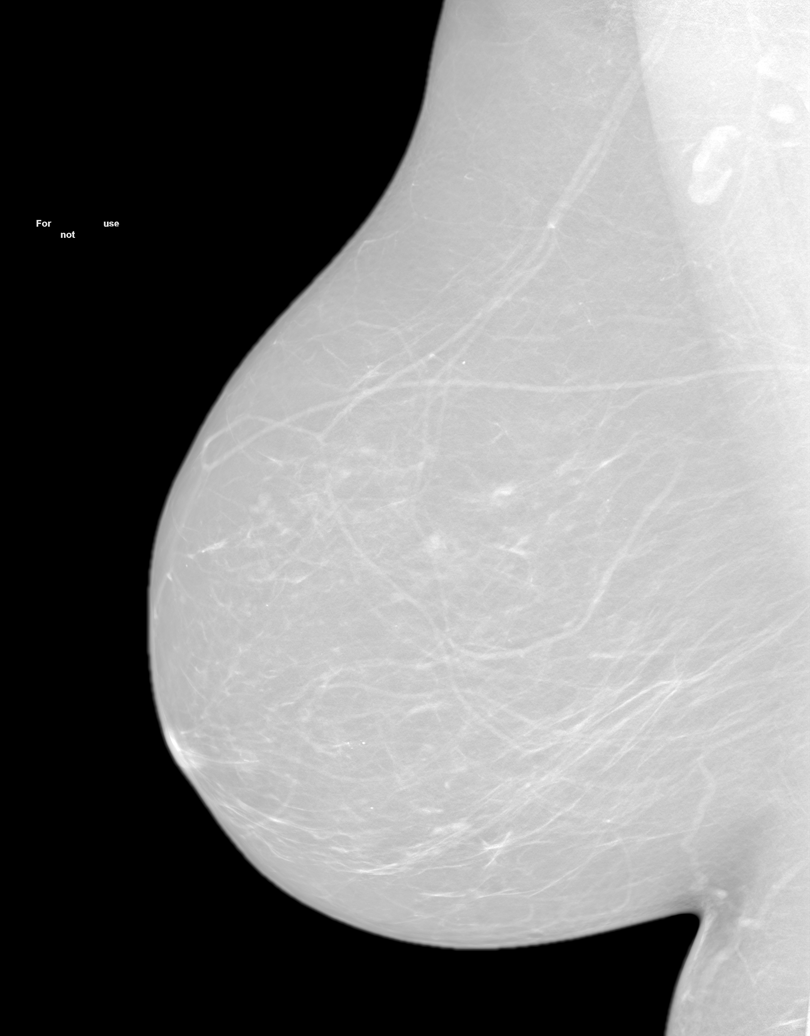

[L]
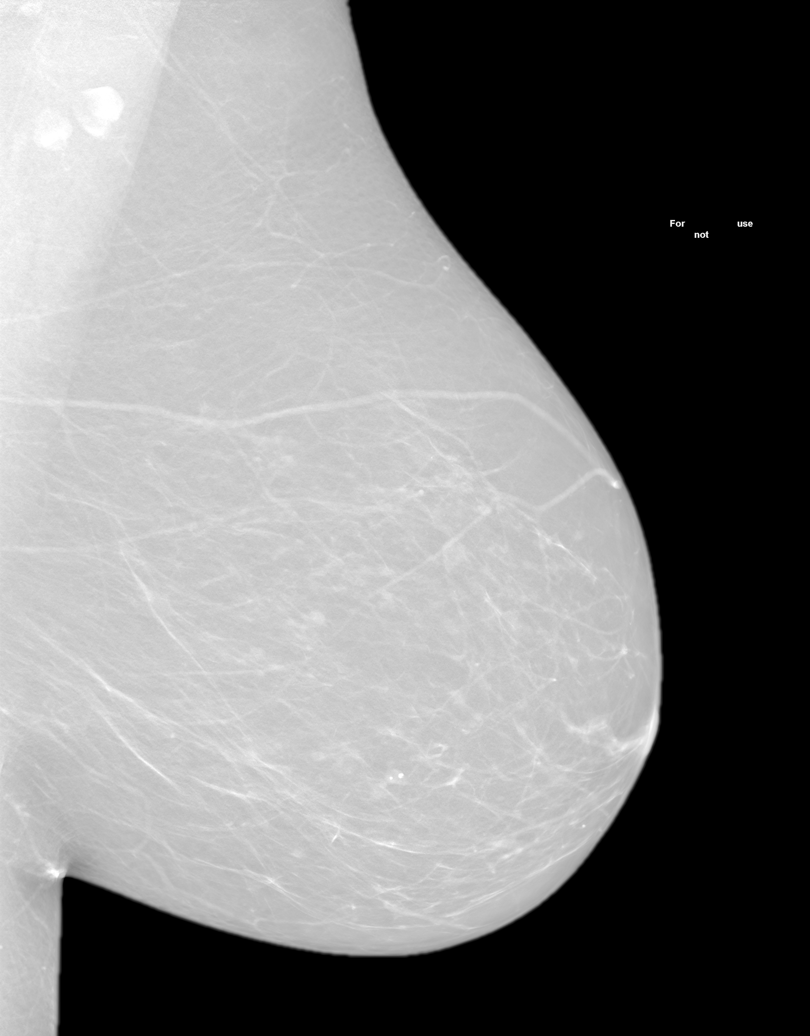

[R CC tomo · right · 0.10mm/px · 2 of 2 slices shown]
[im 1/2]
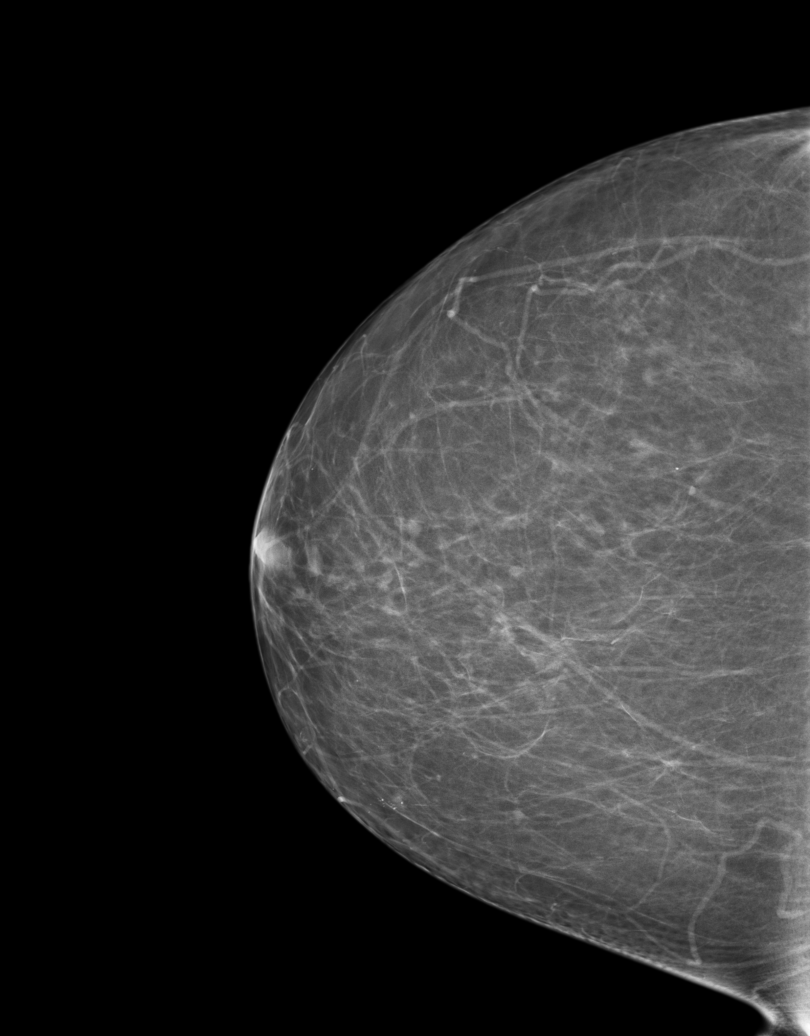
[im 2/2]
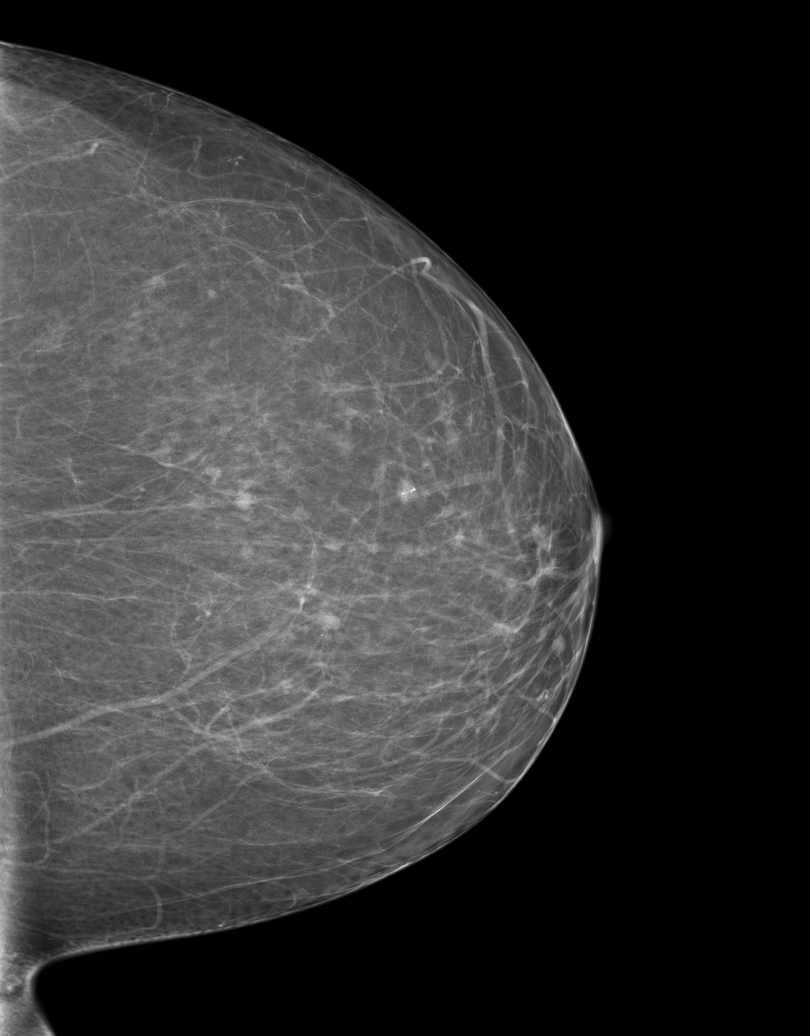

[3D SCREENING MAMMO BIL AND TOMO tomo · 2 acquisitions, 2 frames shown (1 of 2)]
[im 1/2]
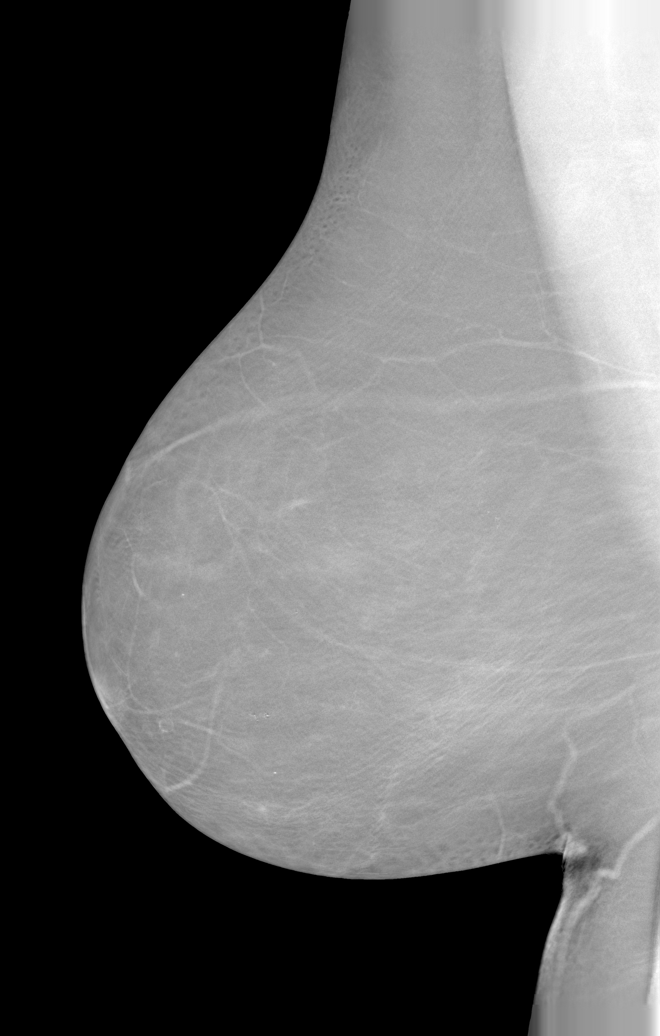
[im 2/2]
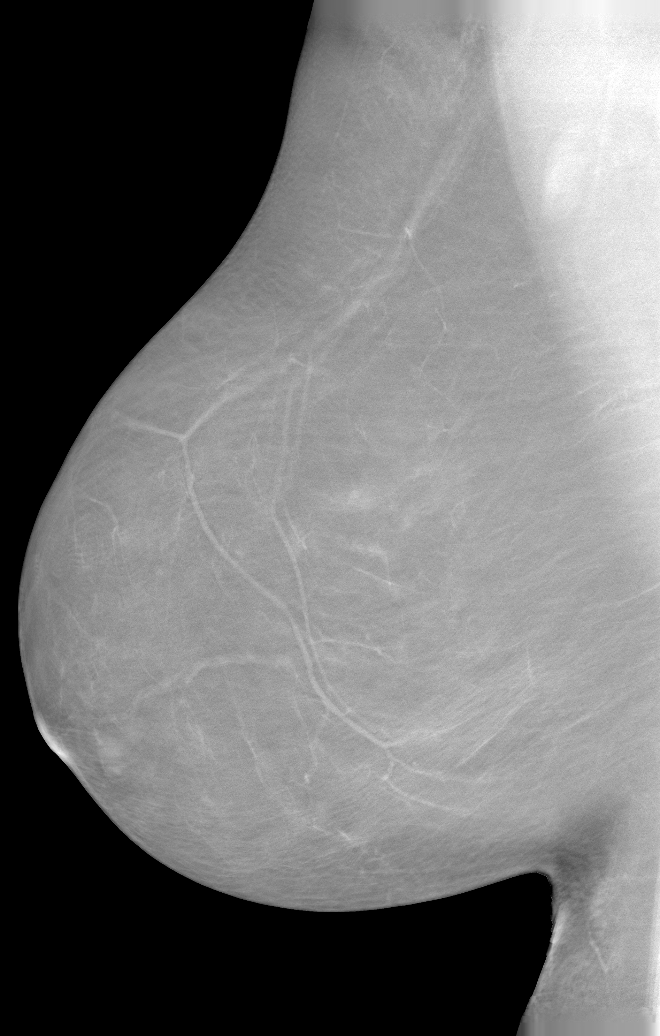

[3D SCREENING MAMMO BIL AND TOMO tomo (2 of 2) · tomo slice 15/93.0]
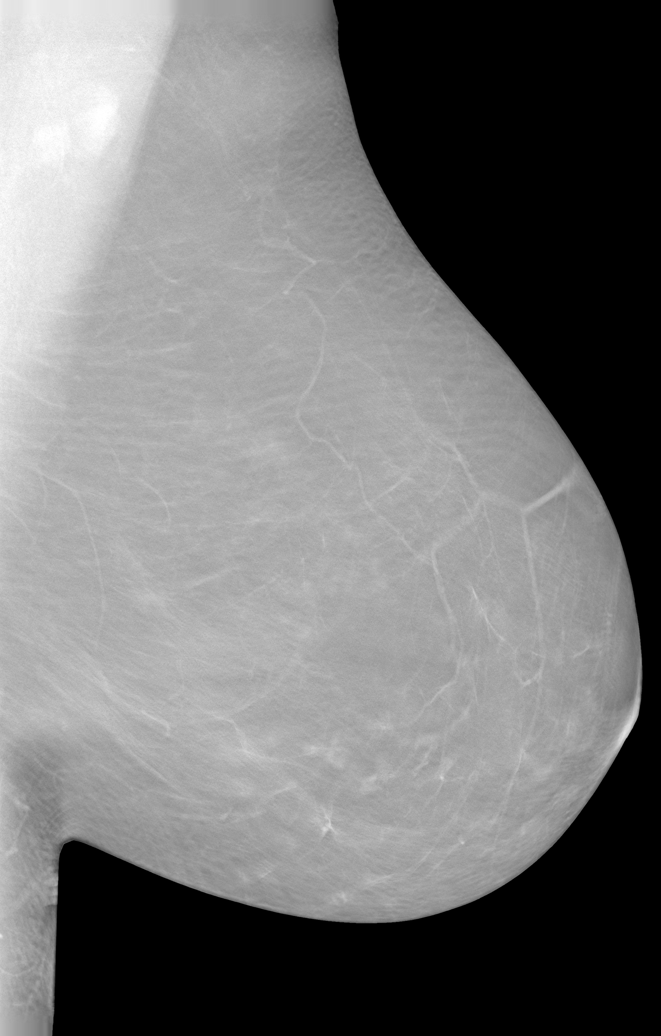

[7 of 24 positions shown; findings below may reference images not displayed]

FINDING: Normal-no evidence of cancer

This statement is mandated by the [place], Department of Health.
Your examination was performed by one of our technologists, who are registered radiological technologists and also specially certified in mammography:
___
Marzan, Heron (M)

Your mammogram was interpreted by our radiologist.

( 
Apple Martha, M.D.

(Annual Breast Examination by a physician or other health care provider
(Annual Mammography Screening beginning at age 40
(Monthly Breast Self Examination

------------- REPORT [id number] -------------
﻿

EXAM:  3D SCREENING MAMMO BIL AND TOMO
FINDINGS: There are scattered fibroglandular elements.  There is no mass or suspicious cluster of microcalcifications.   There is no architectural distortion, skin thickening or nipple retraction.
IMPRESSION: 1.  BIRADS 2-Benign findings. Patient has been added in a reminder system with a target date for the next screening mammography.

2.  DENSITY CODE –  B (Scattered areas of fibroglandular density) 

Final Assessment Code:

BI-RADS 0
 Need additional imaging evaluation.

BI-RADS 1
 Negative mammogram.

BI-RADS 2
 Benign finding.

BI-RADS 3
 Probably benign finding; short-interval follow-up suggested.

BI-RADS 4
 Suspicious abnormality; biopsy should be considered.

BI-RADS 5
 Highly suggestive of malignancy; appropriate action should be taken.

BI-RADS 6
 Known biopsy-proven malignancy; appropriate action should be taken.

NOTE:
In compliance with Federal regulations, the results of this mammogram are being sent to the patient.

## 2022-12-18 ENCOUNTER — Encounter (HOSPITAL_COMMUNITY): Payer: Self-pay

## 2022-12-22 NOTE — Nursing Note (Signed)
PT order faxed to [facility] therapy per patient request. Fax transmission confirmed.

## 2023-01-08 ENCOUNTER — Other Ambulatory Visit: Payer: 59 | Attending: Neurology

## 2023-01-08 ENCOUNTER — Other Ambulatory Visit: Payer: Self-pay

## 2023-01-08 DIAGNOSIS — G319 Degenerative disease of nervous system, unspecified: Secondary | ICD-10-CM | POA: Insufficient documentation

## 2023-01-10 LAB — RHEUMATOID FACTOR, SERUM: RHEUMATOID FACTOR: <13 IU/mL (ref <=30)

## 2023-01-11 ENCOUNTER — Encounter (INDEPENDENT_AMBULATORY_CARE_PROVIDER_SITE_OTHER): Payer: Self-pay | Admitting: Nurse Practitioner

## 2023-01-11 LAB — HEP-2 SUBSTRATE ANTINUCLEAR ANTIBODIES (ANA), SERUM: ANA INTERPRETATION: NEGATIVE

## 2023-01-12 ENCOUNTER — Encounter (INDEPENDENT_AMBULATORY_CARE_PROVIDER_SITE_OTHER): Payer: Self-pay | Admitting: Nurse Practitioner

## 2023-01-12 MED ORDER — ONDANSETRON 4 MG DISINTEGRATING TABLET
4.0000 mg | ORAL_TABLET | Freq: Three times a day (TID) | ORAL | 0 refills | Status: DC | PRN
Start: 2023-01-12 — End: 2023-05-13

## 2023-03-12 ENCOUNTER — Other Ambulatory Visit (INDEPENDENT_AMBULATORY_CARE_PROVIDER_SITE_OTHER): Payer: Self-pay | Admitting: Neurology

## 2023-03-12 MED ORDER — SUMATRIPTAN 50 MG TABLET
50.0000 mg | ORAL_TABLET | Freq: Once | ORAL | 5 refills | Status: DC | PRN
Start: 2023-03-12 — End: 2024-01-04

## 2023-03-19 ENCOUNTER — Encounter (INDEPENDENT_AMBULATORY_CARE_PROVIDER_SITE_OTHER): Payer: Self-pay | Admitting: Neurology

## 2023-03-19 ENCOUNTER — Ambulatory Visit: Payer: 59 | Attending: Neurology | Admitting: Neurology

## 2023-03-19 ENCOUNTER — Other Ambulatory Visit: Payer: Self-pay

## 2023-03-19 VITALS — BP 136/72 | HR 74 | Ht 65.0 in | Wt 221.3 lb

## 2023-03-19 DIAGNOSIS — G0481 Other encephalitis and encephalomyelitis: Secondary | ICD-10-CM | POA: Insufficient documentation

## 2023-03-19 MED ORDER — AMITRIPTYLINE 25 MG TABLET
25.0000 mg | ORAL_TABLET | Freq: Every evening | ORAL | 5 refills | Status: DC
Start: 2023-03-19 — End: 2024-01-04

## 2023-03-19 NOTE — Progress Notes (Signed)
[facility] Neurology  Multiple Sclerosis and Clinical Neuroimmunology Clinic      Date: 03/19/2023  Name: [name]  Age:  52 y.o.  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 Progressively worsening.  -01/2022: Admitted for further workup including LP and likely trial of IVIG and steroids. She declined all three and left the hospital    Pertinent studies:  --MRI brain 11/2021: Per personal review on synapse. There is significant cerebellar atrophy with diffuse hyperintensity on Flair. Review of prior MRI in 05/2021 reveals similar atrophy of the cerebellum.   --CT chest/abd/pelvis 01/2022 (outside): Read as without signs of malignancy  --MRI 02/16/22: Predominantly cerebellar parenchymal volume loss. Otherwise unremarkable supratentorial brain. No acute intracranial process.  I personally reviewed this MRI - I agree that there is cerebellar volume loss. I also do still think there are FLAIR changes in her cerebellum and bilateral cerebellar peduncles.  --Paraneoplastic panel ([facility]) negative in 12/2021. Vitamin E normal.  --Multiple labs in 03/2022 available in Epic  --MRI brain 12/2022: Read as stable, need to get outside pictures.    -----------------------    [name] is a 52 y.o. female who returns in follow up for   Chief Complaint   Patient presents with    Follow Up     Cerebellar degeneration (CMS HCC)        Since last  visit:  -Feels terrible  -Fatigued more than ever  -Legs hurt a lot   -Feels like something set dynamite off in here  -No improvements with infusions, gets terrible diarrhea  -Was hanging in there but now really bad  -Lots of anxiety, daily activities are really hard for her  -Everything is a task  -Bad headaches, ringing in her ears still  -Vivid dreams are back every night  -Days and nights are mixed up, makes her depression and anxiety  -Lots of GI symptoms, muscle/joint stiffness, still doing physical therapy every Friday/every other Friday  -UTI a couple weeks ago  -Last IVIG was 3 weeks ago    -Taking Vistaril rarely    -Zofran made headache worse    Got updated MRI in July, read as stable, had a lot of difficulty tolerating the MRI    Past Medical History:   Diagnosis Date    MDD (major depressive disorder)     Restless leg syndrome          Current Outpatient Medications   Medication Sig    amitriptyline (ELAVIL) 25 mg Oral Tablet Take 1 Tablet (25 mg total) by mouth Every night    clonazePAM (KLONOPIN) 1 mg Oral Tablet Take 1 Tablet (1 mg total) by mouth Every night    diazePAM (VALIUM) 5 mg Oral Tablet Take 1 tab prior to MRI. Can take additional tab as needed. (Patient not taking: Reported on 03/19/2023)    hydrOXYzine pamoate (VISTARIL) 25 mg Oral Capsule Take 1 Capsule (25 mg total) by mouth  Three times a day as needed for Itching 1-2 q8h prn headache    ondansetron (ZOFRAN ODT) 4 mg Oral Tablet, Rapid Dissolve Take 1 Tablet (4 mg total) by mouth Every 8 hours as needed for Nausea/Vomiting    sertraline (ZOLOFT) 100 mg Oral Tablet Take 1 Tablet (100 mg total) by mouth Once a day    SUMAtriptan (IMITREX) 50 mg Oral Tablet Take 1 Tablet (50 mg total) by mouth Once, as needed for Migraine May repeat in 2 hours in needed     Allergies   Allergen Reactions    Penicillins Itching, Rash and Swelling     Family Medical History:    None         Social History     Socioeconomic History    Marital status: Married    Occupational History    Occupation: Ecologist   Tobacco Use    Smoking status: Never    Smokeless tobacco: Never   Vaping Use    Vaping status: Never Used   Substance and Sexual Activity    Alcohol use: Never    Drug use: Never     Social Determinants of Health     Financial Resource Strain: Low Risk  (04/03/2022)    Financial Resource Strain     SDOH Financial: No   Transportation Needs: Low Risk  (04/03/2022)    Transportation Needs     SDOH Transportation: No   Social Connections: Medium Risk (04/03/2022)    Social Connections     SDOH Social Isolation: 3 to 5 times a week   Housing Stability: Low Risk  (04/03/2022)    Housing Stability     SDOH Housing Situation: I have housing.     SDOH Housing Worry: No       Review of Systems:  Other than above, no pertinent positives.    Examination:    Vitals: BP 136/72   Pulse 74   Ht 1.651 m (5\' 5" )   Wt 100 kg (221 lb 5.5 oz)   SpO2 98%   BMI 36.83 kg/m       General Exam:  General: No acute distress.  HEENT: No scleral icterus.  Psychiatric: Tearful.    Neurologic Exam:  Orientation: Awake, alert, appropriately oriented.  Memory: Recent and remote memory appear intact. Formal memory testing not completed today.  Attention: Normal in conversation.  Knowledge: Appropriate.  Language: Fluent with intact comprehension.  Speech: Normal.  Cranial nerves: Extraocular movements are intact. Face activates symmetrically. Hearing intact to conversation.   Sensory: Intact to light, touch, vibration, and proprioception in bilateral great toes  Muscle tone: Normal.    Muscle exam:  Arm Right Left Leg Right Left   Deltoid 5/5 5/5 Iliopsoas 5/5 5/5   Biceps 5/5 5/5 Quads 5/5 5/5   Triceps 5/5 5/5 Hamstrings 5/5 5/5      Ankle Dorsi Flexion 5/5 5/5             Reflexes: Toes downgoing bilaterally.    Coordination: Finger-nose without dysmetria bilaterally.  Gait: Wide-based, ataxic.    Assessment and Plan:    Assessment/Plan   1. Autoimmune encephalitis       Was doing well at last appointment in June but has had increased symptomatic issues since then. IVIG may be contributing to her headaches and not clear that it is definitely providing benefit. Recommend updating labs as below. Increase amitriptyline to 25 mg nightly for headaches, sleep, pain, and mood. Can increase  further from there as needed. Will hold next week's dose of IVIG in case it is contributing to her headache. But if she gets worse we will resume while deciding on a longer term plan. Still suspect this was autoimmune given her stabilization with IVIG previously but need to continue to evaluate. She declined LP previously. Pending updated labs may consider PET scan to exclude underlying malignancy even though no clear autoantibody has been determined yet.    Orders Placed This Encounter    PARANEOPLASTIC AB EVAL W/REFL TITER/LB, BASIC    HEP-2 SUBSTRATE ANTINUCLEAR ANTIBODIES (ANA), SERUM    Rheumatoid Factor    Cpk (Ck)    CBC/DIFF    COMPREHENSIVE METABOLIC PANEL, NON-FASTING    amitriptyline (ELAVIL) 25 mg Oral Tablet     Return in about 3 months (around 06/18/2023) for With Charlott Holler.    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    Rosalyn Gess, MD 03/19/2023   Assistant Professor, Department of Neurology

## 2023-03-22 ENCOUNTER — Other Ambulatory Visit: Payer: Self-pay

## 2023-03-22 ENCOUNTER — Other Ambulatory Visit: Payer: 59 | Attending: Neurology

## 2023-03-22 DIAGNOSIS — G0481 Other encephalitis and encephalomyelitis: Secondary | ICD-10-CM | POA: Insufficient documentation

## 2023-03-22 LAB — COMPREHENSIVE METABOLIC PANEL, NON-FASTING
ALBUMIN/GLOBULIN RATIO: 1.4 (ref 0.8–1.4)
ALBUMIN: 4.6 g/dL (ref 3.5–5.7)
ALKALINE PHOSPHATASE: 81 U/L (ref 34–104)
ALT (SGPT): 26 U/L (ref 7–52)
ANION GAP: 8 mmol/L (ref 4–13)
AST (SGOT): 26 U/L (ref 13–39)
BILIRUBIN TOTAL: 0.8 mg/dL (ref 0.3–1.0)
BUN/CREA RATIO: 13 (ref 6–22)
BUN: 10 mg/dL (ref 7–25)
CALCIUM, CORRECTED: 8.9 mg/dL (ref 8.9–10.8)
CALCIUM: 9.4 mg/dL (ref 8.6–10.3)
CHLORIDE: 105 mmol/L (ref 98–107)
CO2 TOTAL: 27 mmol/L (ref 21–31)
CREATININE: 0.77 mg/dL (ref 0.60–1.30)
ESTIMATED GFR: 92 mL/min/{1.73_m2} (ref >59)
GLOBULIN: 3.2 (ref 2.9–5.4)
GLUCOSE: 97 mg/dL (ref 74–109)
OSMOLALITY, CALCULATED: 278 mosm/kg (ref 270–290)
POTASSIUM: 3.8 mmol/L (ref 3.5–5.1)
PROTEIN TOTAL: 7.8 g/dL (ref 6.4–8.9)
SODIUM: 140 mmol/L (ref 136–145)

## 2023-03-22 LAB — CBC WITH DIFF
BASOPHIL #: 0 10*3/uL (ref 0.00–0.10)
BASOPHIL %: 1 % (ref 0–1)
EOSINOPHIL #: 0.2 10*3/uL (ref 0.00–0.50)
EOSINOPHIL %: 3 % (ref 1–7)
HCT: 40.8 % (ref 31.2–41.9)
HGB: 13.7 g/dL (ref 10.9–14.3)
LYMPHOCYTE #: 1.5 10*3/uL (ref 1.00–3.00)
LYMPHOCYTE %: 24 % (ref 16–44)
MCH: 28.4 pg (ref 24.7–32.8)
MCHC: 33.7 g/dL (ref 32.3–35.6)
MCV: 84.2 fL (ref 75.5–95.3)
MONOCYTE #: 0.5 10*3/uL (ref 0.30–1.00)
MONOCYTE %: 7 % (ref 5–13)
MPV: 8.1 fL (ref 7.9–10.8)
NEUTROPHIL #: 4.2 10*3/uL (ref 1.85–7.80)
NEUTROPHIL %: 65 % (ref 43–77)
PLATELETS: 328 10*3/uL (ref 140–440)
RBC: 4.84 10*6/uL (ref 3.63–4.92)
RDW: 14.2 % (ref 12.3–17.7)
WBC: 6.5 10*3/uL (ref 3.8–11.8)

## 2023-03-22 LAB — CREATINE KINASE (CK), TOTAL, SERUM OR PLASMA: CREATINE KINASE: 69 U/L (ref 30–223)

## 2023-03-24 LAB — RHEUMATOID FACTOR, SERUM: RHEUMATOID FACTOR: <13 [IU]/mL (ref <=30)

## 2023-03-24 LAB — HEP-2 SUBSTRATE ANTINUCLEAR ANTIBODIES (ANA), SERUM: ANA INTERPRETATION: NEGATIVE

## 2023-03-25 ENCOUNTER — Ambulatory Visit (INDEPENDENT_AMBULATORY_CARE_PROVIDER_SITE_OTHER): Payer: Self-pay | Admitting: Neurology

## 2023-03-25 ENCOUNTER — Ambulatory Visit (INDEPENDENT_AMBULATORY_CARE_PROVIDER_SITE_OTHER): Payer: Self-pay | Admitting: Nurse Practitioner

## 2023-03-30 ENCOUNTER — Encounter (INDEPENDENT_AMBULATORY_CARE_PROVIDER_SITE_OTHER): Payer: Self-pay | Admitting: Neurology

## 2023-04-06 ENCOUNTER — Ambulatory Visit (INDEPENDENT_AMBULATORY_CARE_PROVIDER_SITE_OTHER): Payer: Self-pay | Admitting: Neurology

## 2023-04-06 NOTE — Telephone Encounter (Signed)
Regarding: Clinical Question  ----- Message from Cala Bradford D sent at 04/06/2023 11:47 AM EDT -----  Copied From CRM [id number].Pangallo, Mara A called with a clinical question.     Vera from [employer] is calling states she was told by the patient nurse that [facility] IVIG is being placed on hold and she needs someone to call to verify this please call them [phone]

## 2023-04-06 NOTE — Nursing Note (Signed)
Verification- are we discontinuing the IVIG?

## 2023-04-09 NOTE — Nursing Note (Signed)
Notified that it is on hold with possibly discontinuing until after follow up    Clovis Pu, RN

## 2023-04-12 ENCOUNTER — Ambulatory Visit: Payer: 59 | Attending: Neurology | Admitting: Neurology

## 2023-04-12 DIAGNOSIS — Z79899 Other long term (current) drug therapy: Secondary | ICD-10-CM

## 2023-04-12 DIAGNOSIS — G319 Degenerative disease of nervous system, unspecified: Secondary | ICD-10-CM

## 2023-04-12 MED ORDER — MYCOPHENOLATE MOFETIL 250 MG CAPSULE
250.0000 mg | ORAL_CAPSULE | Freq: Two times a day (BID) | ORAL | 5 refills | Status: DC
Start: 2023-04-12 — End: 2023-05-13

## 2023-04-12 NOTE — Progress Notes (Signed)
TELEMEDICINE DOCUMENTATION:    Patient Location:  MyChart video visit from home address: [address]  [place] [address]    Patient/family aware of provider location:  yes  Patient/family consent for telemedicine:  yes  Examination observed and performed by:  Rosalyn Gess, MD    [facility] Department of Neurology  Multiple Sclerosis and Clinical Neuroimmunology Clinic      Date: 04/12/2023  Name: [name]  Age:  52 y.o.  Referring Physician:   Rosalyn Gess, MD  [address]  [address]  [place],  [place] [address]    History of Present Illness:  History obtained from:  patient    -----------------------  Please note, some information in this section carried forward from prior notes. 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 Progressively worsening.  -01/2022: Admitted for further workup including LP and likely trial of IVIG and steroids. She declined all three and left the hospital    Pertinent studies:  --MRI brain 11/2021: Per personal review on synapse. There is significant cerebellar atrophy with diffuse hyperintensity on Flair. Review of prior MRI in 05/2021 reveals similar atrophy of the cerebellum.   --CT chest/abd/pelvis 01/2022 (outside): Read as without signs of malignancy  --MRI 02/16/22: Predominantly cerebellar parenchymal volume loss. Otherwise unremarkable supratentorial brain. No acute intracranial process.  I personally reviewed this MRI - I agree that there is cerebellar volume  loss. I also do still think there are FLAIR changes in her cerebellum and bilateral cerebellar peduncles.  --Paraneoplastic panel ([facility]) negative in 12/2021. Vitamin E normal.  --Multiple labs in 03/2022 available in Epic  --MRI brain 12/2022: Read as stable, outside pictures not available yet (checked for pics again on 04/15/2023)    -----------------------    [name] is a 52 y.o. female who returns in follow up for:     Chief Complaint   Patient presents with    Follow Up      Since last visit:  -Doing better  -Recent UTI x2 since being off IVIG, headaches better, still anxiety  -No prior history of UTI issues  -Walking worse    Past Medical History:   Diagnosis Date    MDD (major depressive disorder)     Restless leg syndrome          Current Outpatient Medications   Medication Sig    amitriptyline (ELAVIL) 25 mg Oral Tablet Take 1 Tablet (25 mg total) by mouth Every night    clonazePAM (KLONOPIN) 1 mg Oral Tablet Take 1 Tablet (1 mg total) by mouth Every night    diazePAM (VALIUM) 5 mg Oral Tablet Take 1 tab prior to MRI. Can take additional tab as needed. (Patient not taking: Reported on 03/19/2023)    hydrOXYzine pamoate (VISTARIL) 25 mg Oral Capsule Take 1 Capsule (25 mg total) by mouth Three times a day as needed for Itching 1-2 q8h prn headache    mycophenolate mofetil (CELLCEPT) 250 mg Oral Capsule Take 1 Capsule (250 mg total) by  mouth Twice daily    ondansetron (ZOFRAN ODT) 4 mg Oral Tablet, Rapid Dissolve Take 1 Tablet (4 mg total) by mouth Every 8 hours as needed for Nausea/Vomiting    sertraline (ZOLOFT) 100 mg Oral Tablet Take 1 Tablet (100 mg total) by mouth Once a day    SUMAtriptan (IMITREX) 50 mg Oral Tablet Take 1 Tablet (50 mg total) by mouth Once, as needed for Migraine May repeat in 2 hours in needed     Allergies   Allergen Reactions    Penicillins Itching, Rash and Swelling     Family Medical History:    None         Social History     Socioeconomic History    Marital status:  Married   Occupational History    Occupation: Ecologist   Tobacco Use    Smoking status: Never    Smokeless tobacco: Never   Vaping Use    Vaping status: Never Used   Substance and Sexual Activity    Alcohol use: Never    Drug use: Never     Social Determinants of Health     Financial Resource Strain: Low Risk  (04/03/2022)    Financial Resource Strain     SDOH Financial: No   Transportation Needs: Low Risk  (04/03/2022)    Transportation Needs     SDOH Transportation: No   Social Connections: Medium Risk (04/03/2022)    Social Connections     SDOH Social Isolation: 3 to 5 times a week   Housing Stability: Low Risk  (04/03/2022)    Housing Stability     SDOH Housing Situation: I have housing.     SDOH Housing Worry: No       Review of Systems:  Other than above, no pertinent positives.    Examination:  Objective: (limited exam via tele)  General: Appears well, no distress.  Neurologic Exam:  Ophthalomscopic: Unable to evaluate over tele.  Mental status: Alert. Orientation, attention/concentration, language appear normal in conversation. Formal measures not tested today.  Cranial nerves: Eyes midline, face symmetric.  Muscle tone: Unable to test via tele.  Muscle exam: Moves bilateral UE against gravity.  Reflexes: Unable to test via tele.  Sensory: Unable to test via tele.  Coordination: Unable to fully assess via tele, no obvious tremor.  Gait: Did not ambulate during this encounter.    Assessment and Plan:  Assessment/Plan   1. Cerebellar degeneration (CMS HCC)    2. Medication management      Intolerable side effects to IVIG and efficacy may have been waning regardless. Recommend trial of Cellcept instead. Still no definitive antibody-proven autoimmune etiology but given clinical picture, MRI findings, and response to immunotherapy still think autoimmune is most likely. Discussed side effects of Cellcept including immunosuppression, need to monitor liver, and GI upset. Will start low dose and  increase if tolerated; has some baseline GI symptoms so will monitor closely. Repeat MRI over the summer reportedly stable (working on getting images) but recommend PET scan to evaluate for underlying malignancy.      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    Rosalyn Gess, MD 04/12/2023   Associate Professor, Department of Neurology

## 2023-04-16 LAB — PARANEOPLASTIC AB EVAL W/REFL TITER/LB, BASIC
ACETYLCHOLINE REC BIND AB: <0.3 nmol/L
ACHR GANGLIONIC(ALPHA3)AB: <55 pmol/L (ref <55)
AGNA/SOX1 AB, IFA: NEGATIVE
AMPHIPHYSIN AB, IFA: NEGATIVE
ANNA1 (HU) AB, IFA: NEGATIVE
ANNA2 (RI) AB, IFA: NEGATIVE
ANNA3 AB, IFA: NEGATIVE
CRMP5/CV2 AB, IFA: NEGATIVE
PCA-TR (DNER) AB, IFA: NEGATIVE
PCA1 (YO) AB, IFA: NEGATIVE
PCA2 AB, IFA: NEGATIVE
STRIATED MUSCLE AB SCREEN: NEGATIVE
VGCC TYPE N AB: <54 pmol/L (ref <54)
VGCC TYPE P/Q AB: <30 pmol/L (ref <30)
VGKC AB: <80 pmol/L (ref <80)

## 2023-04-21 ENCOUNTER — Encounter (INDEPENDENT_AMBULATORY_CARE_PROVIDER_SITE_OTHER): Payer: Self-pay | Admitting: Neurology

## 2023-04-24 ENCOUNTER — Other Ambulatory Visit (INDEPENDENT_AMBULATORY_CARE_PROVIDER_SITE_OTHER): Payer: Self-pay | Admitting: Neurology

## 2023-04-26 ENCOUNTER — Encounter (INDEPENDENT_AMBULATORY_CARE_PROVIDER_SITE_OTHER): Payer: Self-pay

## 2023-04-26 ENCOUNTER — Encounter (INDEPENDENT_AMBULATORY_CARE_PROVIDER_SITE_OTHER): Payer: Self-pay | Admitting: Neurology

## 2023-04-26 ENCOUNTER — Other Ambulatory Visit (INDEPENDENT_AMBULATORY_CARE_PROVIDER_SITE_OTHER): Payer: Self-pay | Admitting: Neurology

## 2023-04-26 DIAGNOSIS — G0481 Other encephalitis and encephalomyelitis: Secondary | ICD-10-CM

## 2023-04-26 MED ORDER — DIAZEPAM 5 MG TABLET
ORAL_TABLET | ORAL | 0 refills | Status: DC
Start: 2023-04-26 — End: 2023-05-13

## 2023-04-29 ENCOUNTER — Ambulatory Visit
Admission: RE | Admit: 2023-04-29 | Discharge: 2023-04-29 | Disposition: A | Discharge status: Home / Self Care | Payer: 59 | Source: Ambulatory Visit | Attending: Neurology | Admitting: Neurology

## 2023-04-29 ENCOUNTER — Other Ambulatory Visit: Payer: Self-pay

## 2023-04-29 DIAGNOSIS — G319 Degenerative disease of nervous system, unspecified: Secondary | ICD-10-CM | POA: Insufficient documentation

## 2023-04-30 ENCOUNTER — Other Ambulatory Visit (HOSPITAL_COMMUNITY): Payer: Self-pay

## 2023-05-01 DIAGNOSIS — G319 Degenerative disease of nervous system, unspecified: Secondary | ICD-10-CM

## 2023-05-03 ENCOUNTER — Encounter (INDEPENDENT_AMBULATORY_CARE_PROVIDER_SITE_OTHER): Payer: Self-pay | Admitting: Neurology

## 2023-05-04 ENCOUNTER — Telehealth (INDEPENDENT_AMBULATORY_CARE_PROVIDER_SITE_OTHER): Payer: Self-pay

## 2023-05-04 DIAGNOSIS — G0481 Other encephalitis and encephalomyelitis: Secondary | ICD-10-CM

## 2023-05-04 DIAGNOSIS — Z5181 Encounter for therapeutic drug level monitoring: Secondary | ICD-10-CM

## 2023-05-04 NOTE — Telephone Encounter (Signed)
Spoke with [name] to discuss rituximab. Initially planned for Cellcept but stated she was not comfortable with side effects and decided against use.    Prior treatment: monthly IVIG     Baseline labs: HBcAb, HBsAb, HBsAg, HCV, HIV, TB, JCV, IgG ordered - plans to complete @ [facility] on rituximab MOA, dosage/administration, side effects, precautions, infusion day process, and insurance authorization process. Prefers to use home infusion if possible.    Endorses being prone to sore throat. PET scan noted that tonsils are enlarged. 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   When probing to determine how often has strep/ sore throat reports was frequent then improved, now starting to notice again. I inquired if she noticed any difference while on IVIG (possible IVIG was coincidentally treating undiagnosed immunodeficiency/ low immunoglobulin levels - we have never checked levels to determine if they are low). In hind sign does think infections were not occurring as frequently while on IVIG. Recommend checking before starting rituximab. If infections become more frequent once treatment is started, adding IVIG may be reasonable alternative to D/C treatment if Ig low.    Vaccines: not current on vaccines. Recommend updating vaccines prior to starting treatment due to reduced efficacy once on therapy. She is agreeable to receiving flu, shingles, and PCV 20 before starting.     Prefers to wait until after the holidays to move start treatment.  Encouraged to contact office with any questions or concerns. She verbalized agreement and understanding to all information.   Maylon Cos, PHARMD  05/04/2023, 19:04    Counseling:    - Rituximab is a monoclonal antibody targeted against the  B-cells of the immune system.    - Administered via IV infusion over ~3.5 hours.  First 2 doses are given 2 weeks apart, then every 6 months thereafter.    - Pre-medications (IV steroid, tylenol, and benadryl) are given  30-60 minutes before infusion.  After infusion, recommend waiting 1 hour to ensure no reactions.     - The most common side effects are infusion related reactions within 24 hours of use. May be local irritation or systemic (hives, difficulty breathing/ angioedema, fever, headache, muscle aches, chills, and fatigue).  This tends to be most common with the first few infusions and decreases with subsequent doses. Notify the infusion nurse if you experience any reactions, as they can be treated with PRN medications.     - Rituximab may increase your risk of infection, including PML. Follow the CDC recommendations to help minimize risk and contact your healthcare provider if signs of infection develop.    - Latent infections, such as hepatitis B and TB may reactivate following Rituxan use. Hep B and TB status will be screened prior to initiation.   - Other common side effects that may occur, such as decreases in blood counts (anemia, neutrophils, lymphocytes, platelets, etc) will be routinely monitored through labs.     - Avoid live vaccines, such as varicella and some immunizations required for travel, while taking Rituximab.

## 2023-05-05 ENCOUNTER — Encounter (INDEPENDENT_AMBULATORY_CARE_PROVIDER_SITE_OTHER): Payer: Self-pay

## 2023-05-05 ENCOUNTER — Encounter (INDEPENDENT_AMBULATORY_CARE_PROVIDER_SITE_OTHER): Payer: Self-pay | Admitting: Neurology

## 2023-05-12 ENCOUNTER — Encounter (HOSPITAL_COMMUNITY): Payer: Self-pay

## 2023-05-12 ENCOUNTER — Inpatient Hospital Stay
Admission: AD | Admit: 2023-05-12 | Discharge: 2023-05-14 | DRG: 057 | Disposition: A | Discharge status: Home / Self Care | Payer: 59 | Source: Ambulatory Visit | Attending: Neurology | Admitting: Neurology

## 2023-05-12 ENCOUNTER — Other Ambulatory Visit: Payer: Self-pay

## 2023-05-12 ENCOUNTER — Inpatient Hospital Stay (HOSPITAL_COMMUNITY): Payer: 59 | Admitting: Neurology

## 2023-05-12 DIAGNOSIS — F32A Depression, unspecified: Secondary | ICD-10-CM | POA: Diagnosis present

## 2023-05-12 DIAGNOSIS — R2689 Other abnormalities of gait and mobility: Secondary | ICD-10-CM

## 2023-05-12 DIAGNOSIS — G319 Degenerative disease of nervous system, unspecified: Principal | ICD-10-CM | POA: Diagnosis present

## 2023-05-12 DIAGNOSIS — F419 Anxiety disorder, unspecified: Secondary | ICD-10-CM | POA: Diagnosis present

## 2023-05-12 DIAGNOSIS — G3189 Other specified degenerative diseases of nervous system: Principal | ICD-10-CM | POA: Diagnosis present

## 2023-05-12 LAB — URINALYSIS, MACROSCOPIC
BILIRUBIN: NEGATIVE mg/dL
BLOOD: NEGATIVE mg/dL
COLOR: NORMAL
GLUCOSE: NEGATIVE mg/dL
KETONES: NEGATIVE mg/dL
NITRITE: NEGATIVE
PH: 5.5 (ref 5.0–8.0)
PROTEIN: NEGATIVE mg/dL
SPECIFIC GRAVITY: 1.01 (ref 1.005–1.030)
UROBILINOGEN: NEGATIVE mg/dL

## 2023-05-12 LAB — URINALYSIS, MICROSCOPIC
RBCS: 1 /[HPF] (ref <6.0)
WBCS: 6 /[HPF] (ref <11.0)

## 2023-05-12 MED ORDER — SODIUM CHLORIDE 0.9 % INTRAVENOUS SOLUTION
1000.0000 mg | INTRAVENOUS | Status: DC
Start: 2023-05-12 — End: 2023-05-13
  Administered 2023-05-12: 0 mg via INTRAVENOUS
  Administered 2023-05-12: 1000 mg via INTRAVENOUS
  Filled 2023-05-12: qty 16

## 2023-05-12 MED ORDER — CLONAZEPAM 1 MG TABLET
1.0000 mg | ORAL_TABLET | Freq: Every evening | ORAL | Status: DC
Start: 2023-05-12 — End: 2023-05-14
  Administered 2023-05-12 – 2023-05-13 (×2): 1 mg via ORAL
  Filled 2023-05-12 (×2): qty 1

## 2023-05-12 MED ORDER — SERTRALINE 100 MG TABLET
100.0000 mg | ORAL_TABLET | Freq: Every day | ORAL | Status: DC
Start: 2023-05-13 — End: 2023-05-14
  Administered 2023-05-13 – 2023-05-14 (×2): 100 mg via ORAL
  Filled 2023-05-12 (×2): qty 1

## 2023-05-12 MED ORDER — ENOXAPARIN 40 MG/0.4 ML SUBCUTANEOUS SYRINGE
40.0000 mg | INJECTION | SUBCUTANEOUS | Status: DC
Start: 2023-05-12 — End: 2023-05-14
  Administered 2023-05-12 – 2023-05-13 (×2): 40 mg via SUBCUTANEOUS
  Filled 2023-05-12 (×3): qty 0.4

## 2023-05-12 MED ORDER — HYDROXYZINE PAMOATE 25 MG CAPSULE
25.0000 mg | ORAL_CAPSULE | Freq: Three times a day (TID) | ORAL | Status: DC | PRN
Start: 2023-05-12 — End: 2023-05-14

## 2023-05-12 MED ORDER — SODIUM CHLORIDE 0.9 % INTRAVENOUS SOLUTION
124.0000 mg/kg | INTRAVENOUS | Status: DC
Start: 2023-05-12 — End: 2023-05-12

## 2023-05-12 MED ORDER — SODIUM CHLORIDE 0.9 % INTRAVENOUS SOLUTION
30.0000 mg/kg | Freq: Once | INTRAVENOUS | Status: DC
Start: 2023-05-12 — End: 2023-05-12

## 2023-05-12 MED ORDER — AMITRIPTYLINE 25 MG TABLET
25.0000 mg | ORAL_TABLET | Freq: Every evening | ORAL | Status: DC
Start: 2023-05-12 — End: 2023-05-14
  Administered 2023-05-12 – 2023-05-13 (×2): 25 mg via ORAL
  Filled 2023-05-12 (×2): qty 1

## 2023-05-12 NOTE — Nurses Notes (Signed)
6w07 patient complaining of UTI symptoms, wondering if we can get a UA order when you have a moment. thanks! [phone]

## 2023-05-12 NOTE — H&P (Signed)
[facility]   Neurology H&P     [name], [name], 52 y.o. 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 Patient has had progressive imbalance and gait instability since January 2022. Also reports issues with impaired spatial judgment and depth perception which have led to falls. Since, symptom severity has fluctuated. More recently, within past 1-2 weeks, reports decreased hand dexterity as well as intermittent tingling in fingertips bilaterally.     Of note, she was admitted for similar complaints in 03/2022 for further workup. During said stay, she declined LP as she was unable to tolerate procedure previously. Received x 5 days IVIG. Afterwards, started on maintenance IVIG monthly with initially improvement in symptoms, though patient stated therapy benefits platuaed and then began experiencing side effects. IVIG was discontinued approximately 1.5 months ago. Since then she reports worsening symptoms as above.    Additionally, patient reports history of anxiety and depression. Denies any other chronic conditions. Follows with Dr. Elesa Massed with [facility].    Per telemedicine progress note with Dr. 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 Progressively worsening.  -01/2022: Admitted for further workup including LP and likely trial of IVIG and steroids. She declined all three and left the hospital     Pertinent studies:  --MRI brain 11/2021: Per personal review on synapse. There is significant cerebellar atrophy with diffuse hyperintensity on Flair. Review of prior MRI in 05/2021 reveals similar atrophy of the cerebellum.   --CT chest/abd/pelvis 01/2022 (outside): Read as without signs of malignancy  --MRI 02/16/22: Predominantly cerebellar parenchymal volume loss. Otherwise unremarkable supratentorial brain. No acute intracranial process.  I personally reviewed this MRI - I agree that there is cerebellar volume loss. I also do still think there are FLAIR changes in her cerebellum and bilateral cerebellar peduncles.  --Paraneoplastic panel ([facility]) negative in 12/2021. Vitamin E normal.  --Multiple labs in 03/2022 available in Epic  --MRI brain 12/2022: Read as stable, outside pictures not available yet (checked for pics again on 04/15/2023)    Admission Source:  Home  Advance Directives:  None-Discussed  Hospice involvement prior to admission?  Not applicable    Location (of pain): Quality (character of pain) Severity (minimal, mild, severe, scale or 1-10) Duration (how long has pain/sx present) Timing (when does pain/sx occur)  Context (activity at/before onset) Modifying Factors (what makes pain/sx  Better/worse) Associate Sign/Sx (what accompanies main pain/sx)    Past Medical History:   Diagnosis Date    MDD (major depressive disorder)     Restless leg syndrome      No past surgical history pertinent negatives on file.    Medications Prior to Admission       Prescriptions    amitriptyline (ELAVIL) 25 mg  Oral Tablet    Take 1 Tablet (25 mg total) by mouth Every night    clonazePAM (KLONOPIN) 1 mg Oral Tablet    Take 1 Tablet (1 mg total) by mouth Every night    diazePAM (VALIUM) 5 mg Oral Tablet    Take 1 tab prior to  PET scan. Can take additional tab as needed.    Patient not taking:  Reported on 05/12/2023    hydrOXYzine pamoate (VISTARIL) 25 mg Oral Capsule    Take 1 Capsule (25 mg total) by mouth Three times a day as needed for Itching 1-2 q8h prn headache    mycophenolate mofetil (CELLCEPT) 250 mg Oral Capsule    Take 1 Capsule (250 mg total) by mouth Twice daily    Patient not taking:  Reported on 05/12/2023    ondansetron (ZOFRAN ODT) 4 mg Oral Tablet, Rapid Dissolve    Take 1 Tablet (4 mg total) by mouth Every 8 hours as needed for Nausea/Vomiting    Patient not taking:  Reported on 05/12/2023    sertraline (ZOLOFT) 100 mg Oral Tablet    Take 1 Tablet (100 mg total) by mouth Once a day    SUMAtriptan (IMITREX) 50 mg Oral Tablet    Take 1 Tablet (50 mg total) by mouth Once, as needed for Migraine May repeat in 2 hours in needed          Allergies   Allergen Reactions    Penicillins Itching, Rash and Swelling     Social History     Tobacco Use    Smoking status: Never    Smokeless tobacco: Never   Substance Use Topics    Alcohol use: Never     Past Family History:   Family Medical History:    None        ROS: Other than ROS in the HPI, all other systems were negative.    Exam:  Temperature: 36.6 C (97.9 F)  Heart Rate: 82  BP (Non-Invasive): (!) 143/75  SpO2: 97 %  General: appears in good health, comfortable  HENT:Head atraumatic and normocephalic  Neck: supple, symmetrical, trachea midline  Lungs: Breathing nonlabored  Cardiovascular: regular rate and rhythm on the monitor  Abdomen: non-distended  Extremities: No cyanosis or deformity  Glasgow: Eye opening:  4 spontaneous, Verbal response:  5 oriented, Best motor response:  6 obeys commands  Mental status:  Level of Consciousness: Alert  Orientations: Alert and oriented x 3  MemoryRegistration, Recall, and Following of commands is normal  AttentionsAttention and Concentration are normal  Knowledge: Good  Language: Normal  Speech: Normal  Cranial nerves:   CN2: Visual  acuity and fields intact  CN 3,4,6: EOMI, PERRLA  CN 5 Facial sensation intact  CN 7Face symmetrical  CN 8: Hearing grossly intact  CN 9,10: Palate symmetric   CN 11: Sternocleidomastoid and Trapezius have normal strength.  CN 12: Tongue normal with no fasiculations or deviation  Gait, Coordination, and Reflexes:   Gait:  Unsteady, wide based gait, unable to perform HTT, Romberg+  Coordination: Ataxia with FTN, RAM, HTS bilaterally    Muscle tone: WNL  Muscle exam  Arm Right Left Leg Right Left   Deltoid 5/5 5/5 Iliopsoas 5/5 5/5   Biceps 5/5 5/5 Quads 5/5 5/5   Triceps 5/5 5/5 Hamstrings 5/5 5/5   Wrist Extension 5/5 5/5 Ankle Dorsi Flexion 5/5 5/5   Wrist Flexion 5/5 5/5 Ankle Plantar Flexion 5/5 5/5   Interossei 5/5 5/5 Ankle Eversion  5/5 5/5   APB 5/5 5/5 Ankle Inversion 5/5 5/5     Reflexes   RJ BJ TJ KJ AJ Plantars Hoffman's   Right 2+ 2+ 2+ 2+ 2+ Downgoing Not present   Left 2+ 2+ 2+ 2+ 2+ Downgoing Not present     Sensory: Sensory exam in the upper and lower extremities is normal  Normal color, texture and turgor without significant lesions or rashes  Diabetes Monitors:  Patient not a diabetic.    Labs:    Results for orders placed or performed during the hospital encounter of 05/12/23 (from the past 24 hour(s))   URINALYSIS, MACROSCOPIC AND MICROSCOPIC W/CULTURE REFLEX    Specimen: Urine, Clean Catch    Narrative    The following orders were created for panel order URINALYSIS, MACROSCOPIC AND MICROSCOPIC W/CULTURE REFLEX.  Procedure                               Abnormality         Status                     ---------                               -----------         ------                     URINALYSIS, MACROSCOPIC[[id number]]      Abnormal            Final result               URINALYSIS, MICROSCOPIC[[id number]]      Normal              Final result                 Please view results for these tests on the individual orders.   URINALYSIS, MACROSCOPIC   Result Value Ref Range    SPECIFIC GRAVITY 1.010  1.005 - 1.030    GLUCOSE Negative Negative mg/dL    PROTEIN Negative Negative mg/dL    BILIRUBIN Negative Negative mg/dL    UROBILINOGEN Negative Negative mg/dL    PH 5.5 5.0 - 8.0    BLOOD Negative Negative mg/dL    KETONES Negative Negative mg/dL    NITRITE Negative Negative    LEUKOCYTES Small (A) Negative WBCs/uL    APPEARANCE Clear Clear    COLOR Normal (Yellow) Normal (Yellow)   URINALYSIS, MICROSCOPIC   Result Value Ref Range    WBCS 6.0 <11.0 /hpf    RBCS 1.0 <6.0 /hpf    BACTERIA Occasional or less Occasional or less /hpf    SQUAMOUS EPITHELIAL CELLS Occasional or less Occasional or less /lpf    MUCOUS Light Light /lpf      Paraneoplastic panel serum 03/22/23 - negative  Review of reports and notes reveal:   PET CT INITIAL STAGING: BODY (HEAD TO THIGH) WO IV CONTRAST performed on 04/30/2023 7:52 AM  IMPRESSION:  Relatively decreased cerebellar activity, consistent with history of degeneration.  Mild symmetric pharyngeal tonsilar activity, probably physiological.  Consider correlative physical exam.  No definite finding suggestive of neoplastic involvement in the cranium, neck, chest, abdomen or pelvis.        Independent Interpretation of images or specimens:  MRI brain w/wo 02/16/22 - notable for cerebellar  atrophy  out of proportion to rest of brain volume      Assessment/Plan:  Active Hospital Problems    Diagnosis    Primary Problem: Cerebellar degeneration (CMS HCC)    Autoimmune cerebellar degeneration (CMS HCC)     [name] is a 52 y.o., White female who presents for autoimmune cerebellar degeneration. Patient with progressive imbalance and gait instability since January 2022. Previously on monthly maintenance IVIG, discontinued 1.5 months ago due to side effects. Reports worsening symptoms since stopping therapy. Admitted for further evaluation and management.      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  -Admit to General Neurology Service  -Vitals and neuro checks Q4H  -Telemetry x 48  hours  -Regular diet  -Start on high-dose solumedrol x 3 days (end 11/23); depending on patient response, can consider PO taper thereafter  -Discussed additional treatment with IVIG, patient declined at this time  -Also discussed lumbar puncture for additional diagnostic data, patient declined    History of Anxiety and Depression  -Resumed home amitriptyline, sertraline, clonazepam QHS, prn vistaril      DNR Status this admission:  Full Code  Palliative/Supportive Care consulted?  no  Hospice Consulted?  Not applicable    Current Comorbid Conditions - Neurology Consult  Severe Brain Conditions: NA  Coma less than 8: Coma -Not applicable  Loss of Consciousness: no  Encephalopathy No   Stroke: Not applicable  Craniectomy: no  Seizure: Seizure-Not applicable  Respiratory Conditions:  NA  Renal Failure: No  Acute Blood Loss Anemia: No  Coagulopathy: Not applicable  Acid-Base Disorders: No   Hemiplegia/Hemiparesis/Paraplegia: No   Fatigue/Debility: No Weakness   Muscle Wasting and atrophy (specify location):  NA    DVT/PE Prophylaxis: Enoxaparin      Daleen Bo, MD  05/12/23  [facility]      I saw and examined the patient on 05/13/23.  I reviewed the resident's note.  I agree with the findings and plan of care as documented in the resident's note.  Any exceptions/additions are edited/noted.    MDM High: Chronic illness with progression and high risk of further disability. Extensive data review as above.      Clovis Cao, MD 05/13/2023, 15:07  Associate Professor  Department of Neurology  [facility]

## 2023-05-12 NOTE — Nurses Notes (Signed)
message sent via epic chat to Dr. Sheppard Plumber regarding solumedrol infusion. Initially in report I was told there would be a bolus and then a continuous drip. The medication was not verified by pharmacy, message sent to pharmacist to verify and send med to floor. Then the continuous drip was discontinued and the bolus remained.     "Can you make sure the solumedrol is ordered the way you want it? I was told in report there would be a bolus and then a drip. The pharmacist had not verified it yet so i messaged him and then the bolus disappeared.  Just want to make sure she is getting what she is supposed to. Thanks Lorina Rabon [id number]"    "actually it seems the continuous was discontinued and just the bolus remains."    Dr Sheppard Plumber responded "Yeah, we usually just do boluses without drip, so what's in now seems correct."

## 2023-05-12 NOTE — Care Plan (Signed)
Problem: Adult Inpatient Plan of Care  Goal: Plan of Care Review  Reactivated  Goal: Patient-Specific Goal (Individualized)  Reactivated  Goal: Absence of Hospital-Acquired Illness or Injury  Reactivated  Intervention: Identify and Manage Fall Risk  Recent Flowsheet Documentation  Taken 05/12/2023 2000 by Floria Raveling, RN  Safety Promotion/Fall Prevention:   nonskid shoes/slippers when out of bed   safety round/check completed  Intervention: Prevent Skin Injury  Recent Flowsheet Documentation  Taken 05/12/2023 2000 by Floria Raveling, RN  Body Position: supine, head elevated  Intervention: Prevent and Manage VTE (Venous Thromboembolism) Risk  Recent Flowsheet Documentation  Taken 05/12/2023 2000 by Floria Raveling, RN  VTE Prevention/Management:   ambulation promoted   dorsiflexion/plantar flexion performed  Intervention: Prevent Infection  Recent Flowsheet Documentation  Taken 05/12/2023 2000 by Floria Raveling, RN  Infection Prevention:   promote handwashing   rest/sleep promoted  Goal: Optimal Comfort and Wellbeing  Reactivated  Intervention: Provide Person-Centered Care  Recent Flowsheet Documentation  Taken 05/12/2023 2000 by Floria Raveling, RN  Trust Relationship/Rapport:   care explained   choices provided   questions answered   questions encouraged   reassurance provided  Goal: Rounds/Family Conference  Reactivated     Problem: Fall Injury Risk  Goal: Absence of Fall and Fall-Related Injury  Reactivated  Intervention: Promote Injury-Free Environment  Recent Flowsheet Documentation  Taken 05/12/2023 2000 by Floria Raveling, RN  Safety Promotion/Fall Prevention:   nonskid shoes/slippers when out of bed   safety round/check completed     Problem: Health Knowledge, Opportunity to Enhance (Adult,Obstetrics,Pediatric)  Goal: Knowledgeable about Health Subject/Topic  Description: Patient will demonstrate the desired outcomes by discharge/transition of care.  Reactivated  Intervention: Enhance Health Knowledge  Recent Flowsheet Documentation  Taken 05/12/2023  2000 by Floria Raveling, RN  Family/Support System Care:   presence promoted   self-care encouraged   support provided

## 2023-05-13 LAB — CBC WITH DIFF
BASOPHIL #: <0.1 10*3/uL (ref <=0.20)
BASOPHIL %: 0.2 %
EOSINOPHIL #: <0.1 10*3/uL (ref <=0.50)
EOSINOPHIL %: 0.2 %
HCT: 39.5 % (ref 34.8–46.0)
HGB: 13 g/dL (ref 11.5–16.0)
IMMATURE GRANULOCYTE #: <0.1 10*3/uL (ref <0.10)
IMMATURE GRANULOCYTE %: 0.2 % (ref 0.0–1.0)
LYMPHOCYTE #: 0.7 10*3/uL — ABNORMAL LOW (ref 1.00–4.80)
LYMPHOCYTE %: 14.4 %
MCH: 28.4 pg (ref 26.0–32.0)
MCHC: 32.9 g/dL (ref 31.0–35.5)
MCV: 86.2 fL (ref 78.0–100.0)
MONOCYTE #: <0.1 10*3/uL — ABNORMAL LOW (ref 0.20–1.10)
MONOCYTE %: 1 %
MPV: 9.9 fL (ref 8.7–12.5)
NEUTROPHIL #: 4.07 10*3/uL (ref 1.50–7.70)
NEUTROPHIL %: 84 %
PLATELETS: 270 10*3/uL (ref 150–400)
RBC: 4.58 10*6/uL (ref 3.85–5.22)
RDW-CV: 13.4 % (ref 11.5–15.5)
WBC: 4.9 10*3/uL (ref 3.7–11.0)

## 2023-05-13 LAB — BASIC METABOLIC PANEL
ANION GAP: 9 mmol/L (ref 4–13)
BUN/CREA RATIO: 12 (ref 6–22)
BUN: 12 mg/dL (ref 8–25)
CALCIUM: 9.1 mg/dL (ref 8.6–10.2)
CHLORIDE: 112 mmol/L — ABNORMAL HIGH (ref 96–111)
CO2 TOTAL: 18 mmol/L — ABNORMAL LOW (ref 22–30)
CREATININE: 0.97 mg/dL (ref 0.60–1.05)
ESTIMATED GFR - FEMALE: 69 mL/min/BSA (ref >=60)
GLUCOSE: 258 mg/dL — ABNORMAL HIGH (ref 65–125)
POTASSIUM: 3.4 mmol/L — ABNORMAL LOW (ref 3.5–5.1)
SODIUM: 139 mmol/L (ref 136–145)

## 2023-05-13 LAB — MAGNESIUM: MAGNESIUM: 1.9 mg/dL (ref 1.8–2.6)

## 2023-05-13 LAB — CREATINE KINASE (CK), TOTAL, SERUM OR PLASMA: CREATINE KINASE: 106 U/L (ref 25–190)

## 2023-05-13 LAB — LIPID PANEL
CHOL/HDL RATIO: 4
CHOLESTEROL: 184 mg/dL (ref 100–200)
HDL CHOL: 46 mg/dL — ABNORMAL LOW (ref >=50)
LDL CALC: 117 mg/dL — ABNORMAL HIGH (ref <100)
NON-HDL: 138 mg/dL (ref <=190)
TRIGLYCERIDES: 118 mg/dL (ref <150)
VLDL CALC: 20 mg/dL (ref <30)

## 2023-05-13 LAB — URINE CULTURE: URINE CULTURE: NO GROWTH

## 2023-05-13 LAB — AST (SGOT): AST (SGOT): 31 U/L (ref 11–34)

## 2023-05-13 LAB — PHOSPHORUS: PHOSPHORUS: 2.1 mg/dL — ABNORMAL LOW (ref 2.4–4.7)

## 2023-05-13 LAB — ALT (SGPT): ALT (SGPT): 30 U/L (ref <31)

## 2023-05-13 MED ORDER — SODIUM CHLORIDE 0.9 % INTRAVENOUS SOLUTION
1000.0000 mg | INTRAVENOUS | Status: DC
Start: 2023-05-13 — End: 2023-05-14
  Administered 2023-05-13: 0 mg via INTRAVENOUS
  Administered 2023-05-13: 1000 mg via INTRAVENOUS
  Filled 2023-05-13 (×3): qty 16

## 2023-05-13 MED ORDER — ONDANSETRON HCL (PF) 4 MG/2 ML INJECTION SOLUTION
4.0000 mg | Freq: Three times a day (TID) | INTRAMUSCULAR | Status: DC | PRN
Start: 2023-05-13 — End: 2023-05-14
  Administered 2023-05-13: 4 mg via INTRAVENOUS
  Filled 2023-05-13: qty 2

## 2023-05-13 NOTE — Nurses Notes (Signed)
Following text page sent to service:    Patient requesting nausea medication.

## 2023-05-13 NOTE — Care Management Notes (Signed)
[facility]  Care Management Initial Evaluation    Patient Name: [name]  Date of Birth: [birth year]  Sex: female  Date/Time of Admission: 05/12/2023 12:42 PM  Room/Bed: 07/A  Payor: Advertising copywriter / Plan: OTHER Advertising copywriter / Product Type: PPO /   Primary Care Providers:  Oneal Grout, MD, MD (General)    Pharmacy Info:   Preferred Pharmacy       [facility] - [place], [place] - [address]    [address] [place] [address]    Phone: [phone] Fax: [phone]    Hours: Not open 24 hours          Emergency Contact Info:   Extended Emergency Contact Information  Primary Emergency Contact: Macdonnell,DONALD  Address: [address]           [place], [place] [address] [place]  Mobile Phone: [phone]  Relation: Husband    History:   [name] is a 52 y.o., female, admitted with cerebellar degeneration.    Height/Weight: 165.1 cm (5\' 5" ) / 98.2 kg (216 lb 7.9 oz)     LOS: 1 day   Admitting Diagnosis: Cerebellar degeneration (CMS HCC) [G31.9]    Assessment:      05/13/23 1151   Assessment Details   Assessment Type Admission   Date of Care Management Update 05/13/23   Date of Next DCP Update 05/14/23   Readmission   Is this a readmission? No   Insurance Information/Type   Insurance type Medicare   Employment/Financial   Patient has Prescription Coverage?  Yes        Name of Insurance Coverage for Medications [employer]   Financial/Environmental Concerns none   Living Environment   Select an age group to open "lives with" row.  Adult   Lives With spouse   Living Arrangements house   Able to Return to Prior Arrangements yes   Home Safety   Home Assessment: No Problems Identified   Home Accessibility stairs to enter home;bed and bath on same level   Care Management Plan   Discharge Planning Status initial meeting   Projected Discharge Date 05/17/23   CM will evaluate for rehabilitation potential yes   Discharge Needs Assessment   Equipment  Currently Used at Home cane, straight   Equipment Needed After Discharge none   Discharge Facility/Level of Care Needs Home (Patient/Family Member/other)(code 1)   Transportation Available family or friend will provide   Referral Information   Admission Type inpatient   Address Verified verified-no changes   Arrived From home or self-care   ADVANCE DIRECTIVES   Does the Patient have an Advance Directive? No, Information Offered and Refused     52 y/o female admitted with cerebellar degeneration.  Patient is alert and oriented.  She lives with her husband in a 2 story house with 2 STE.  She stays on main level of home.  She is independent at baseline and uses a straight cane occasionally for ambulation assist.  She is not on oxygen.  No dialysis.   No HH.  She is current with her PCP.  She has prescription medication coverage through [employer] and obtains medications from [facility].  She declines MPOA.    Plan is to monitor patient, vitals and neuro checks, telemetry x 48 hours, regular diet, medication management, poss LP, labs, PT/OT.    Discharge Plan:  Home vs home with Home Health  Discharge plans to be  completed pending patient progress.    The patient will continue to be evaluated for developing discharge needs.     Case Manager: Sena Hitch, CLINICAL CARE COORDINATOR  Phone: [phone]

## 2023-05-13 NOTE — Progress Notes (Signed)
[facility]  Neurology Progress Note    [name], [name], 52 y.o. female  Date of Admission:  05/12/2023  Date of service: 05/13/2023  Date of Birth:  Mar 18, 1971    Chief Complaint: Autoimmune cerebellar degeneration   Pt's condition today: stable    Subjective: NAEO. Patient seen and examined at bedside. She reports feeling better overall since starting on steroid therapy.    Vital Signs:  Temp (24hrs) Max:37.2 C (99 F)      Systolic (24hrs), Avg:126 , Min:106 , Max:143     Diastolic (24hrs), Avg:76, Min:75, Max:78    Temp  Avg: 36.7 C (98.1 F)  Min: 36.5 C (97.7 F)  Max: 37.2 C (99 F)  MAP (Non-Invasive)  Avg: 88.8 mmHG  Min: 83 mmHG  Max: 95 mmHG  Pulse  Avg: 84.3  Min: 75  Max: 91  Resp  Avg: 16.7  Min: 16  Max: 18  SpO2  Avg: 96.5 %  Min: 94 %  Max: 98 %       Today's Physical Exam:  General: alert  Mental status: Alert and oriented x 3  Memory: Registration, Recall, and Following of commands is normal  Attention: Attention and Concentration are normal  Knowledge: Good  Language and Speech: Normal and Normal  Cranial nerves:   Cranial nerves 2-12 are normal  Muscle tone: WNL  Motor strength: Motor strength is normal throughout.  Sensory: Sensory exam in the upper and lower extremities is normal  Gait: wide based, requires assistance  Coordination: Ataxia with FTN, RAM, HTS bilaterally (L>R)  Reflexes: Reflexes are 2/2 throughout    Current Medications:  amitriptyline (ELAVIL) tablet, 25 mg, Oral, NIGHTLY  clonazePAM (klonoPIN) tablet, 1 mg, Oral, NIGHTLY  enoxaparin PF (LOVENOX) 40 mg/0.4 mL SubQ injection, 40 mg, Subcutaneous, Q24H  hydrOXYzine pamoate (VISTARIL) capsule, 25 mg, Oral, Q8H PRN  methylPREDNISolone (SOLU-medrol) 1,000 mg in NS 100 mL IVPB, 1,000 mg, Intravenous, Q24H  sertraline (ZOLOFT) tablet, 100 mg, Oral, Daily        I/O:  I/O last 24 hours:    Intake/Output Summary (Last 24 hours) at 05/13/2023 9562  Last data filed at 05/13/2023 0500  Gross per 24 hour   Intake 966 ml    Output 300 ml   Net 666 ml     I/O current shift:  11/20 1900 - 11/21 0659  In: 566 [P.O.:450]  Out: -   Last BM:      Labs:  Reviewed: I have reviewed all lab results.  Lab Results Today:    Results for orders placed or performed during the hospital encounter of 05/12/23 (from the past 24 hour(s))   URINALYSIS, MACROSCOPIC   Result Value Ref Range    SPECIFIC GRAVITY 1.010 1.005 - 1.030    GLUCOSE Negative Negative mg/dL    PROTEIN Negative Negative mg/dL    BILIRUBIN Negative Negative mg/dL    UROBILINOGEN Negative Negative mg/dL    PH 5.5 5.0 - 8.0    BLOOD Negative Negative mg/dL    KETONES Negative Negative mg/dL    NITRITE Negative Negative    LEUKOCYTES Small (A) Negative WBCs/uL    APPEARANCE Clear Clear    COLOR Normal (Yellow) Normal (Yellow)   URINALYSIS, MICROSCOPIC   Result Value Ref Range    WBCS 6.0 <11.0 /hpf    RBCS 1.0 <6.0 /hpf    BACTERIA Occasional or less Occasional or less /hpf    SQUAMOUS EPITHELIAL CELLS Occasional or less Occasional or less /  lpf    MUCOUS Light Light /lpf   URINE CULTURE    Specimen: Urine, Clean Catch   Result Value Ref Range    URINE CULTURE No Growth 18-24 hrs.    ALT (SGPT) - TOMORROW   Result Value Ref Range    ALT (SGPT) 30 <31 U/L   AST (SGOT) - TOMORROW   Result Value Ref Range    AST (SGOT)  31 11 - 34 U/L   BASIC METABOLIC PANEL, NON-FASTING   Result Value Ref Range    SODIUM 139 136 - 145 mmol/L    POTASSIUM 3.4 (L) 3.5 - 5.1 mmol/L    CHLORIDE 112 (H) 96 - 111 mmol/L    CO2 TOTAL 18 (L) 22 - 30 mmol/L    ANION GAP 9 4 - 13 mmol/L    CALCIUM 9.1 8.6 - 10.2 mg/dL    GLUCOSE 865 (H) 65 - 125 mg/dL    BUN 12 8 - 25 mg/dL    CREATININE 7.84 6.96 - 1.05 mg/dL    BUN/CREA RATIO 12 6 - 22    ESTIMATED GFR - FEMALE 69 >=60 mL/min/BSA   CREATINE KINASE, CK   Result Value Ref Range    CREATINE KINASE 106 25 - 190 U/L   LIPID PANEL -TOMORROW   Result Value Ref Range    CHOLESTEROL  184 100 - 200 mg/dL    HDL CHOL 46 (L) >=29 mg/dL    TRIGLYCERIDES 528 <413 mg/dL    LDL  CALC 244 (H) <010 mg/dL    VLDL CALC 20 <27 mg/dL    NON-HDL 253 <=664 mg/dL    CHOL/HDL RATIO 4.0    MAGNESIUM   Result Value Ref Range    MAGNESIUM 1.9 1.8 - 2.6 mg/dL   PHOSPHORUS   Result Value Ref Range    PHOSPHORUS 2.1 (L) 2.4 - 4.7 mg/dL   CBC WITH DIFF   Result Value Ref Range    WBC 4.9 3.7 - 11.0 x10^3/uL    RBC 4.58 3.85 - 5.22 x10^6/uL    HGB 13.0 11.5 - 16.0 g/dL    HCT 40.3 47.4 - 25.9 %    MCV 86.2 78.0 - 100.0 fL    MCH 28.4 26.0 - 32.0 pg    MCHC 32.9 31.0 - 35.5 g/dL    RDW-CV 56.3 87.5 - 64.3 %    PLATELETS 270 150 - 400 x10^3/uL    MPV 9.9 8.7 - 12.5 fL    NEUTROPHIL % 84.0 %    LYMPHOCYTE % 14.4 %    MONOCYTE % 1.0 %    EOSINOPHIL % 0.2 %    BASOPHIL % 0.2 %    NEUTROPHIL # 4.07 1.50 - 7.70 x10^3/uL    LYMPHOCYTE # 0.70 (L) 1.00 - 4.80 x10^3/uL    MONOCYTE # <0.10 (L) 0.20 - 1.10 x10^3/uL    EOSINOPHIL # <0.10 <=0.50 x10^3/uL    BASOPHIL # <0.10 <=0.20 x10^3/uL    IMMATURE GRANULOCYTE % 0.2 0.0 - 1.0 %    IMMATURE GRANULOCYTE # <0.10 <0.10 x10^3/uL       Review of reports and notes reveal:   NA    Patient/ Family Discussion: Will be updated as appropriate      Assessment/Plan:  Active Hospital Problems    Diagnosis    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 Patient with progressive imbalance and gait instability since January 2022. Previously on monthly maintenance IVIG, discontinued 1.5 months ago due to side effects. Reports worsening symptoms since stopping therapy. Admitted for further evaluation and management.       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   -Ordered MDS2 lab serum     History of Anxiety and Depression  -Resumed home amitriptyline, sertraline, clonazepam QHS, prn vistaril      DVT/PE Prophylaxis - Enoxaparin  Consults - none  Hardware (Lines, Drains, Foley, Tubes) - PIV  Diet - Regular  Activity - Up w/ assistance and Up in chair TID w/ all meals  Therapy - PT/OT  Disposition - TBD      Daleen Bo, MD  05/13/23  [facility]       I saw and examined the patient.  I reviewed the resident's note.  I agree with the findings and plan of care as documented in the resident's note.  Any exceptions/additions are edited/noted.    Clovis Cao, MD 05/13/2023, 15:10  Associate Professor  Department of Neurology  [facility]

## 2023-05-13 NOTE — Care Plan (Signed)
[facility]  Rehabilitation Services  Occupational Therapy Initial Evaluation    Patient Name: [name]  Date of Birth: [birth year]  Height: Height: 165.1 cm (5\' 5" )  Weight: Weight: 98.2 kg (216 lb 7.9 oz)  Room/Bed: 07/A  Payor: Advertising copywriter / Plan: OTHER Advertising copywriter / Product Type: PPO /     Assessment:   Pt tolerated Occupational therapy session well. Mrs.[name] presents with mildly decreased balance, coordination, sensation, endurance and activity tolerance with inital CGA HHA for short distance functional mobility and ADL trasnfers with progressin with SBA and FWW use household distances. Pt reports familiy assist is available with reutrn home with no concerns. Reocmmend Home with initial 24/7 assist, Outpatient PT/OT and continued use of DME including FWW and shower chair with assist for safety.      Discharge Needs:   Equipment Recommendation: none anticipated (has DME)    Discharge Disposition: home with 24/7 assistance, home with outpatient services    JUSTIFICATION OF DISCHARGE RECOMMENDATION   Based on current diagnosis, functional performance prior to admission, and current functional performance, this patient requires continued OT services in home with 24/7 assistance, home with outpatient services  in order to achieve significant functional improvements.    Plan:   Current Intervention: ADL retraining, balance training, bed mobility training, endurance training, strengthening, transfer training, therapeutic exercise, ROM (range of motion)    To provide Occupational therapy services 1x/day, minimum of 2x/week, until discharge, until goals are met.       The risks/benefits of therapy have been discussed with the patient/caregiver and he/she is in agreement with the established plan of care.       Subjective & Objective        05/13/23 0819   Therapist Pager   OT Assigned/ Pager # [name] [phone]   Rehab Session   Document Type evaluation   OT Visit Date 05/13/23   Total OT Minutes:  19   Patient Effort good   Symptoms Noted During/After Treatment fatigue   General Information   Patient Profile Reviewed yes   Onset of Illness/Injury or Date of Surgery 05/12/23   Pertinent History of Current Functional Problem [name] is a 52 y.o., White female who presents for autoimmune cerebellar degeneration. Patient with progressive imbalance and gait instability since January 2022. Previously on monthly maintenance IVIG, discontinued 1.5 months ago due to side effects. Reports worsening symptoms since stopping therapy. Admitted for further evaluation and management.   Medical Lines PIV Line;Telemetry   Respiratory Status room air   Pre Treatment Status   Pre Treatment Patient Status Patient supine in bed;Call light within reach;Telephone within reach;Nurse approved session;Patient safety alarm activated   Support Present Pre Treatment  Family present   Communication Pre Treatment  Nurse   Mutuality/Individual Preferences   Individualized Care Needs OOB with FWW A x 1   Living Environment   Lives With spouse   Living Arrangements house   Home Accessibility stairs to enter home;bed and bath on same level   Living Environment Comment Pt. lives with spouse in a house with 2 STE and main level set up. Spouse can provide assistance upon d/c.   Home Main Entrance   Number of Stairs, Main Entrance two   Stair Railings, Main Entrance railing on right side (ascending)   Functional Level Prior   Ambulation 1 - assistive equipment   Transferring 1 - assistive equipment   Toileting 0 - independent   Bathing 0 - independent  Dressing 0 - independent   Eating 0 - independent   Communication 0 - understands/communicates without difficulty   Prior Functional Level Comment Pt is independent and active at baseline with all ADLs/IADLs   Self-Care   Usual Activity Tolerance moderate   Current Activity Tolerance moderate   Activity/Exercise/Self-Care Comment rollator, shower chair and cane   Vital Signs   Vitals Comment  no s.s   Pain   Additional Documentation Pain Scale: Numbers Pre/Post-Treatment (Group)   Pain Assessment   Pre/Posttreatment Pain Comment no pain reported   Coping/Psychosocial Response Interventions   Plan Of Care Reviewed With patient   Cognition   Behavior/Mood Observations behavior appropriate to situation, WNL/WFL;alert;cooperative   Orientation Status oriented x 3   Attention WNL/WFL   Follows Commands WNL   RUE Assessment   RUE Assessment X- Exceptions   RUE ROM WFL   RUE Strength grossly 4/5   RUE Sensation light touch sensation deficits identified   RUE Other mild impaired proprioception and reports numbness digits dips distal  BUEs   LUE Assessment   LUE Assessment X-Exceptions   LUE ROM WFL   LUE Strength grossly 4/5   LUE Sensation light touch sensation deficits identified   LUE Other mild impaired proprioception and reports numbness digits dips distal BUEs   Mobility Assessment/Training   Mobility Comment Pt completed functional mobility 73ft x 2 with CGA HHA, completed functional mobility 171ft with SBA and FWW, 2 instances of self corrected LOB with turning.   Bed Mobility   Bed Mobility, Assistive Device bed rails;Head of Bed Elevated   Supine-Sit Independence stand-by assistance;verbal cues required   Transfer Assessment/Treatment   Sit-Stand Independence contact guard assist;verbal cues required   Stand-Sit Independence contact guard assist;verbal cues required   Sit-Stand-Sit, Assist Device handheld assist   Bed-Chair Independence stand-by assistance;verbal cues required   Bed-Chair-Bed Assist Device walker, front wheeled   Toilet Transfer Independence contact guard assist   Toilet Transfer Assist Device handheld assist   Transfer Impairments balance impaired;endurance;postural control impaired;ROM decreased;strength decreased   Transfer Comment Pt required furniture holding and HHA without use of FWW with CGA, improved to SBA with use of FWW with cues for safety.   ADL Assessment/Intervention    ADL Comments Reports impaired sensation BUE digits DIPs distal however with functional use   Lower Body Dressing Assessment/Training   Position sitting   DRESSING ASSESSED Don Socks   Independence Level  standby assist   Toileting Assessment/Training   Position sitting   Independence Level  standby assist   Grooming/Oral Hygeine  Assessment/Training   Position standing   Independence Level standby assist   Comment washed hands standing at sink   Motor Skills/Interventions   Additional Documentation Therapeutic Exercise (Group)   Post Treatment Status   Post Treatment Patient Status Patient sitting in bedside chair or w/c;Call light within reach;Telephone within reach;Patient safety alarm activated   Support Present Post Treatment  None   Communication Post Treatement Nurse   Care Plan Goals   OT Rehab Goals Occupational Therapy Goal;Bed Mobility Goal;LB Dressing Goal;Toileting Goal;Transfer Training Goal;Grooming Goal   Occupational Therapy Goals   OT Goal, Date Established 05/13/23   OT Goal, Time to Achieve by discharge   OT Goal, Activity Type increase endurance fair+ or greater to facilitate improved ADL performance   Bed Mobility Goal   Bed Mobility Goal, Date Established 05/13/23   Bed Mobility Goal, Time to Achieve by discharge   Bed Mobility Goal, Activity Type all bed  mobility activities   Bed Mobility Goal, Independence Level independent   Grooming Goal   Grooming Goal, Date Established 05/13/23   Grooming Goal, Time to Achieve by discharge   Grooming Goal, Activity Type all grooming tasks   Grooming Goal, Independence  independent   Grooming Goal, Position standing   LB Dressing Goal   LB Dressing Goal, Date Established 05/13/23   LB Dressing Goal, Time to Achieve by discharge   LB Dressing Goal, Activity Type all lower body dressing tasks   LB Dressing Goal, Independence Level independent   Toileting Goal   Toileting Goal, Date Established 05/13/23   Toileting Goal, Time to Achieve by discharge    Toileting Goal, Activity Type all toileting tasks   Toileting Goal, Independence Level independent   Transfer Training Goal   Transfer Training Goal, Date Established 05/13/23   Transfer Training Goal, Time to Achieve by discharge   Transfer Training Goal, Activity Type bed-to-chair/chair-to-bed;sit-to-stand/stand-to-sit;toilet   Transfer Training Goal, Independence Level modified independence   Planned Therapy Interventions, OT Eval   Planned Therapy Interventions ADL retraining;balance training;bed mobility training;endurance training;strengthening;transfer training;therapeutic exercise;ROM (range of motion)   Clinical Impression   Functional Level at Time of Session Pt tolerated Occupational therapy session well. Mrs.Cauthorn presents with mildly decreased balance, coordination, sensation, endurance and activity tolerance with inital CGA HHA for short distance functional mobility and ADL trasnfers with progressin with SBA and FWW use household distances. Pt reports familiy assist is available with reutrn home with no concerns. Reocmmend Home with initial 24/7 assist, Outpatient PT/OT and continued use of DME including FWW and shower chair with assist for safety.   Criteria for Skilled Therapeutic Interventions Met (OT) yes;skilled treatment is necessary   Rehab Potential good   Therapy Frequency 1x/day;minimum of 2x/week   Predicted Duration of Therapy until discharge;until goals are met   Anticipated Equipment Needs at Discharge none anticipated  (has DME)   Anticipated Discharge Disposition home with 24/7 assistance;home with outpatient services   Evaluation Complexity Justification   Occupational Profile Review Expanded review   Performance Deficits Endurance;Balance;Strength;Coordination;Sensation;5+ deficits;Mobility   Clinical Decision Making Moderate analytic complexity   Evaluation Complexity Moderate       Therapist:   Hosford Ropes, OT   Pager #: [phone]

## 2023-05-13 NOTE — Care Plan (Signed)
[facility]  Rehabilitation Services  Physical Therapy Initial Evaluation    Patient Name: [name]  Date of Birth: 09-19-1970  Height: Height: 165.1 cm (5\' 5" )  Weight: Weight: 98.2 kg (216 lb 7.9 oz)  Room/Bed: 07/A  Payor: Advertising copywriter / Plan: OTHER Advertising copywriter / Product Type: PPO /     Assessment:      [name] tolerated PT eval well. Pt. reports she is mod ind. at baseline for household amb, and uses a rollator as needed. Pt.reports she is close to baseline. She was SBA with FWW for amb. 100 and CGA without DME. At this time, PT recs home with assist and continueing outpatient PT once medically stable for d/c    Discharge Needs:    Equipment Recommendation: (P) none anticipated (pt owns rollator and shower chair.)     Discharge Disposition: (P) home with assist, home with outpatient services    The above recommendation is based upon the current examination and evaluation performed on this date. As subsequent sessions are completed, recommendations will be updated accordingly.   JUSTIFICATION OF DISCHARGE RECOMMENDATION   Based on current diagnosis, functional performance prior to admission, and current functional performance, this patient requires continued PT services in (P) home with assist, home with outpatient services in order to achieve significant functional improvements in these deficit areas: (P) aerobic capacity/endurance, gait, locomotion, and balance.        Plan:   Current Intervention: (P) bed mobility training, balance training, joint mobilization, gait training, home exercise program, manual therapy techniques, neuromuscular re-education, orthotic fitting/training, patient/family education, postural re-education, ROM (range of motion), stair training, strengthening, stretching, transfer training  To provide physical therapy services (P) minimum of 2x/week  for duration of (P) until discharge.    The risks/benefits of therapy have been discussed with the  patient/caregiver and he/she is in agreement with the established plan of care.       Subjective & Objective        05/13/23 0818   Therapist Pager   PT Assigned/ Pager # Leotis Shames [phone]   Rehab Session   Document Type evaluation   PT Visit Date 05/13/23   Total PT Minutes: 19   Patient Effort good   Symptoms Noted During/After Treatment fatigue   General Information   Patient Profile Reviewed yes   Pertinent History of Current Functional Problem [name] is a 52 y.o., White female who presents for autoimmune cerebellar degeneration. Patient with progressive imbalance and gait instability since January 2022. Previously on monthly maintenance IVIG, discontinued 1.5 months ago due to side effects. Reports worsening symptoms since stopping therapy. Admitted for further evaluation and management.   Medical Lines PIV Line   Respiratory Status room air   Existing Precautions/Restrictions full code;fall precautions   Mutuality/Individual Preferences   Anxieties, Fears or Concerns Pt. reports no concerns about home d/c   Individualized Care Needs OOB w FWW A x1   Plan of Care Reviewed With patient   Living Environment   Lives With spouse   Living Arrangements house   Home Accessibility stairs to enter home;bed and bath on same level   Living Environment Comment Pt. lives with spouse in a house with 2 STE and main level set up. Spouse can provide assistance upon d/c.   Home Main Entrance   Stair Railings, Main Entrance railing on right side (ascending)   Number of Stairs, Main Entrance two   Functional Level Prior   Ambulation 1 -  assistive equipment   Transferring 1 - assistive equipment   Prior Functional Level Comment Pt. uses no DME vs. rollator as needed. She primarily ambulates hold distances only . She goes to outpatient PT 1x a week   Self-Care   Usual Activity Tolerance moderate   Current Activity Tolerance moderate   Activity/Exercise/Self-Care Comment rollator, shower chair, and cane   Pre Treatment Status    Pre Treatment Patient Status Patient supine in bed;Call light within reach;Telephone within reach;Nurse approved session   Support Present Pre Treatment  Family present   Communication Pre Treatment  Nurse   Communication Pre Treatment Comment RN medically cleared pt. for therapy. Pt agreeable to therapy;seen with OT for a cluster of care.   Cognition   Behavior/Mood Observations behavior appropriate to situation, WNL/WFL   Orientation Status oriented x 4   Attention WNL/WFL   Follows Commands WNL   Vital Signs   O2 Delivery Pre Treatment room air   O2 Delivery Post Treatment room air   Pain Assessment   Pre/Posttreatment Pain Comment no c/o pain   LLE Assessment   LLE Assessment X-Exceptions   LLE ROM WFL   LLE Strength WFL   LLE Coordination impaired with mobility   Trunk Assessment   Trunk Assessment X-Exceptions   Trunk ROM WFL   Trunk Strength WFL   Trunk Coordination impaired with mobiltiy   Bed Mobility   Bed Mobility, Assistive Device Head of Bed Elevated;bed rails   Supine-Sit Independence stand-by assistance;verbal cues required   Impairments flexibility decreased;endurance   Transfer Assessment/Treatment   Sit-Stand Independence contact guard assist;verbal cues required   Stand-Sit Independence contact guard assist;verbal cues required   Sit-Stand-Sit, Assist Device handheld assist   Bed-Chair Independence stand-by assistance   Bed-Chair-Bed Assist Device walker, front wheeled   Toilet Transfer Independence contact guard assist   Toilet Transfer Assist Device handheld assist   Transfer Impairments balance impaired;endurance;coordination impaired   Transfer Comment CGA for safety   Gait Assessment/Treatment   Total Distance Ambulated 110   Independence  contact guard assist;stand-by assistance   Assistive Device  walker, front wheeled   Distance in Feet 10',100'   Gait Speed decreased   Safety Issues  balance decreased during turns;step length decreased;weight-shifting ability decreased   Impairments   balance impaired;endurance;coordination impaired;strength decreased   Comment Pt. amb. 10' with CGA and 100' with FWW and SBA.  Pt. education on using FWW upon d/c   Balance   Sitting Balance: Static fair + balance   Sitting, Dynamic (Balance) fair + balance   Sit-to-Stand Balance fair balance   Standing Balance: Static fair balance   Standing Balance: Dynamic fair - balance   Post Treatment Status   Post Treatment Patient Status Patient sitting in bedside chair or w/c;Call light within reach;Telephone within reach;Patient safety alarm activated   Support Present Post Treatment  None   Communication Post Treatement Nurse   Plan of Care Review   Plan Of Care Reviewed With patient   Basic Mobility Am-PAC/6Clicks Score (APPROVED Staff)   Turning in bed without bedrails 4   Lying on back to sitting on edge of flat bed 4   Moving to and from a bed to a chair 4   Standing up from chair 4   Walk in room 4   Climbing 3-5 steps with railing 3   6 Clicks Raw Score total 23   Standardized (t-scale) score 50.88   Patient Mobility Goal (JHHLM) 7- Walk 25 feet or more  3X/day   Exercise/Activity Level Performed 8- Walked 250 feet or more   Physical Therapy Clinical Impression   Assessment Ms. Coppernoll tolerated PT eval well. Pt. reports she is mod ind. at baseline for household amb, and uses a rollator as needed. Pt.reports she is close to baseline. She was SBA with FWW for amb. 100 and CGA without DME. At this time, PT recs home with assist and continueing outpatient PT once medically stable for d/c   Patient/Family Goals Statement to increase mobility   Criteria for Skilled Therapeutic yes;skilled treatment is necessary   Pathology/Pathophysiology Noted musculoskeletal;neuromuscular   Impairments Found (describe specific impairments) aerobic capacity/endurance;gait, locomotion, and balance   Functional Limitations in Following  self-care;home management;community/leisure   Disability: Inability to Perform community/leisure   Rehab  Potential good   Therapy Frequency minimum of 2x/week   Predicted Duration of Therapy Intervention (days/wks) until discharge   Anticipated Equipment Needs at Discharge (PT) none anticipated  (pt owns rollator and shower chair.)   Anticipated Discharge Disposition home with assist;home with outpatient services   Evaluation Complexity Justification   Patient History: Co-morbidity/factors that impact Plan of Care 1-2 that impact Plan of Care;One or more other medical co-morbidity   Examination Components 4 or more Exam elements addressed;Range of motion;Strength;Balance;Bed mobility;Transfers;Ambulation;Stair mobility   Presentation Evolving: Symptoms, complaints, characteristics of condition changing &/or cognitive deficits present   Clinical Decision Making Moderate complexity   Evaluation Complexity Moderate complexity   Care Plan Goals   PT Rehab Goals Bed Mobility Goal;Gait Training Goal;Transfer Training Goal;Stairs Training Goal   Bed Mobility Goal   Bed Mobility Goal, Date Established 05/13/23   Bed Mobility Goal, Time to Achieve by discharge   Bed Mobility Goal, Activity Type all bed mobility activities   Bed Mobility Goal, Independence Level modified independence   Gait Training  Goal, Distance to Achieve   Gait Training  Goal, Date Established 05/13/23   Gait Training  Goal, Time to Achieve by discharge   Gait Training  Goal, Independence Level modified independence   Gait Training  Goal, Assist Device least restricted assistive device   Gait Training  Goal, Distance to Achieve 250   Stairs Training Goal   Stairs Training Goal, Date Established 05/13/23   Stairs Training Goal, Time to Achieve by discharge   Stairs Training Goal, Independence Level modified independence   Stairs Training Goal, Assist Device least restrictive assistive device   Stairs Training Goal, Number of Stairs to Achieve 12   Transfer Training Goal   Transfer Training Goal, Date Established 05/13/23   Transfer Training Goal, Time to  Achieve by discharge   Transfer Training Goal, Activity Type sit-to-stand/stand-to-sit;bed-to-chair/chair-to-bed   Transfer Training Goal, Independence Level modified independence   Transfer Training Goal, Assist Device least restrictive assistive device   Planned Therapy Interventions, PT Eval   Planned Therapy Interventions (PT) bed mobility training;balance training;joint mobilization;gait training;home exercise program;manual therapy techniques;neuromuscular re-education;orthotic fitting/training;patient/family education;postural re-education;ROM (range of motion);stair training;strengthening;stretching;transfer training       Therapist:   Gloris Ham, PT, DPT  Pager #: [phone]

## 2023-05-13 NOTE — Pharmacy (Signed)
Pharmacy Medication Reconciliation    Patient Name: [name], [name]  Date of Service: 05/12/2023  Date of Admission: 05/12/2023  Date of Birth: 21-Jan-1971  Length of Stay:   0 days   Service: NEUROLOGY 1    Transitions of Care:  Discharge Pharmacy services were discussed with the patient and the Meds to [facility] flowsheet and preferred pharmacy information were updated in EMR if applicable and able to assess with patient.    Information was collected from:  Patient and Pharmacy    [facility] [place], [place] - [address]  [address]  [place] [address]  Phone: [phone] Fax: [phone]    Does the Patient have Prescription Coverage? Yes    Clarified Prior to Admission Medications:  Prior to Admission medications    Medication Sig Taking Resumed Y/N (RPh) Comments   acetaminophen (TYLENOL) 500 mg Oral Tablet Take 2 Tablets (1,000 mg total) by mouth Once per day as needed for Pain (headache) PRN  No  Reports alternating with ibuprofen nightly for headache   amitriptyline (ELAVIL) 25 mg Oral Tablet Take 1 Tablet (25 mg total) by mouth Every night Yes  Yes     clonazePAM (KLONOPIN) 1 mg Oral Tablet Take 1 Tablet (1 mg total) by mouth Every night Yes  Yes  04/14/23 #30 tb   hydrOXYzine pamoate (VISTARIL) 25 mg Oral Capsule Take 1 Capsule (25 mg total) by mouth Three times a day as needed for Ianxiety PRN  Yes  03/17/23 #30 cap; reports not needing often (anxiety)   Ibuprofen (MOTRIN) 200 mg Oral Tablet 400 - 600 mg daily prn for headache PRN  No  Reports alternating with Tylenol nightly for headache   No  No  States last dose was approx 2 weeks ago. 04/12/23 30d   sertraline (ZOLOFT) 100 mg Oral Tablet Take 1 Tablet (100 mg total) by mouth Once a day Yes  Yes     SUMAtriptan (IMITREX) 50 mg Oral Tablet Take 1 Tablet (50 mg total) by mouth Once, as needed for Migraine May repeat in 2 hours in needed PRN  No  Reports has 'not had in quite awhile'; 04/26/23 #11 tb     Did patient's home  medication list require updates? Yes    Medication history was obtained by a medication historian working: Remotely    Medications UPDATED on Prior to Admission Med List:  Hydroxyzine pamoate prn reason    Number of medications UPDATED: 1    Medications ADDED to Prior to Admission Med List:  Tylenol  Ibuprofen    Number of medications ADDED: 2    Allergies:    Allergies   Allergen Reactions    Penicillins Itching, Rash and Swelling     Medications REMOVED from home medication list:  Diazepam  Mycophenolate mofetil  Ondansetron    Number of medications REMOVED: 3    Aldona Lento, PharmD, BCPS  Medication history collected and reviewed by: Brett Canales, PHARMACY INTERN

## 2023-05-13 NOTE — Care Plan (Signed)
Problem: Adult Inpatient Plan of Care  Goal: Plan of Care Review  Outcome: Ongoing (see interventions/notes)  Goal: Patient-Specific Goal (Individualized)  Outcome: Ongoing (see interventions/notes)  Goal: Absence of Hospital-Acquired Illness or Injury  Outcome: Ongoing (see interventions/notes)  Intervention: Identify and Manage Fall Risk  Recent Flowsheet Documentation  Taken 05/13/2023 2000 by Floria Raveling, RN  Safety Promotion/Fall Prevention:   fall prevention program maintained   safety round/check completed  Intervention: Prevent Skin Injury  Recent Flowsheet Documentation  Taken 05/13/2023 2000 by Floria Raveling, RN  Body Position: supine, head elevated  Goal: Optimal Comfort and Wellbeing  Outcome: Ongoing (see interventions/notes)  Intervention: Provide Person-Centered Care  Recent Flowsheet Documentation  Taken 05/13/2023 2000 by Floria Raveling, RN  Trust Relationship/Rapport:   care explained   choices provided   questions encouraged   questions answered  Goal: Rounds/Family Conference  Outcome: Ongoing (see interventions/notes)     Problem: Fall Injury Risk  Goal: Absence of Fall and Fall-Related Injury  Outcome: Ongoing (see interventions/notes)  Intervention: Promote Injury-Free Environment  Recent Flowsheet Documentation  Taken 05/13/2023 2000 by Floria Raveling, RN  Safety Promotion/Fall Prevention:   fall prevention program maintained   safety round/check completed     Problem: Health Knowledge, Opportunity to Enhance (Adult,Obstetrics,Pediatric)  Goal: Knowledgeable about Health Subject/Topic  Description: Patient will demonstrate the desired outcomes by discharge/transition of care.  Outcome: Ongoing (see interventions/notes)  Intervention: Enhance Health Knowledge  Recent Flowsheet Documentation  Taken 05/13/2023 2000 by Floria Raveling, RN  Family/Support System Care: self-care encouraged

## 2023-05-14 DIAGNOSIS — F32A Depression, unspecified: Secondary | ICD-10-CM

## 2023-05-14 DIAGNOSIS — Z79899 Other long term (current) drug therapy: Secondary | ICD-10-CM

## 2023-05-14 DIAGNOSIS — F419 Anxiety disorder, unspecified: Secondary | ICD-10-CM

## 2023-05-14 LAB — CBC WITH DIFF
BASOPHIL #: <0.1 10*3/uL (ref <=0.20)
BASOPHIL %: 0.1 %
EOSINOPHIL #: <0.1 10*3/uL (ref <=0.50)
EOSINOPHIL %: 0 %
HCT: 40.1 % (ref 34.8–46.0)
HGB: 13.4 g/dL (ref 11.5–16.0)
IMMATURE GRANULOCYTE #: <0.1 10*3/uL (ref <0.10)
IMMATURE GRANULOCYTE %: 0.6 % (ref 0.0–1.0)
LYMPHOCYTE #: 0.99 10*3/uL — ABNORMAL LOW (ref 1.00–4.80)
LYMPHOCYTE %: 7.6 %
MCH: 28.6 pg (ref 26.0–32.0)
MCHC: 33.4 g/dL (ref 31.0–35.5)
MCV: 85.7 fL (ref 78.0–100.0)
MONOCYTE #: 0.27 10*3/uL (ref 0.20–1.10)
MONOCYTE %: 2.1 %
MPV: 9.8 fL (ref 8.7–12.5)
NEUTROPHIL #: 11.63 10*3/uL — ABNORMAL HIGH (ref 1.50–7.70)
NEUTROPHIL %: 89.6 %
PLATELETS: 290 10*3/uL (ref 150–400)
RBC: 4.68 10*6/uL (ref 3.85–5.22)
RDW-CV: 13.6 % (ref 11.5–15.5)
WBC: 13 10*3/uL — ABNORMAL HIGH (ref 3.7–11.0)

## 2023-05-14 LAB — MAGNESIUM: MAGNESIUM: 2 mg/dL (ref 1.8–2.6)

## 2023-05-14 LAB — BASIC METABOLIC PANEL
ANION GAP: 9 mmol/L (ref 4–13)
BUN/CREA RATIO: 16 (ref 6–22)
BUN: 12 mg/dL (ref 8–25)
CALCIUM: 9.4 mg/dL (ref 8.6–10.2)
CHLORIDE: 111 mmol/L (ref 96–111)
CO2 TOTAL: 18 mmol/L — ABNORMAL LOW (ref 22–30)
CREATININE: 0.76 mg/dL (ref 0.60–1.05)
ESTIMATED GFR - FEMALE: >90 mL/min/BSA (ref >=60)
GLUCOSE: 174 mg/dL — ABNORMAL HIGH (ref 65–125)
POTASSIUM: 3.9 mmol/L (ref 3.5–5.1)
SODIUM: 138 mmol/L (ref 136–145)

## 2023-05-14 LAB — PHOSPHORUS: PHOSPHORUS: 3.5 mg/dL (ref 2.4–4.7)

## 2023-05-14 MED ORDER — SODIUM CHLORIDE 0.9 % INTRAVENOUS SOLUTION
1000.0000 mg | INTRAVENOUS | Status: AC
Start: 2023-05-14 — End: 2023-05-14
  Administered 2023-05-14: 0 mg via INTRAVENOUS
  Administered 2023-05-14: 1000 mg via INTRAVENOUS
  Filled 2023-05-14: qty 16

## 2023-05-14 MED ORDER — PREDNISONE 10 MG TABLET
ORAL_TABLET | ORAL | 0 refills | Status: AC
Start: 2023-05-14 — End: 2023-06-11

## 2023-05-14 MED ORDER — FAMOTIDINE 20 MG TABLET
20.0000 mg | ORAL_TABLET | Freq: Two times a day (BID) | ORAL | 0 refills | Status: AC
Start: 2023-05-14 — End: 2023-06-13

## 2023-05-14 MED ORDER — FAMOTIDINE 20 MG TABLET
20.0000 mg | ORAL_TABLET | Freq: Two times a day (BID) | ORAL | Status: DC
Start: 2023-05-14 — End: 2023-05-14
  Administered 2023-05-14: 20 mg via ORAL
  Filled 2023-05-14: qty 1

## 2023-05-14 NOTE — Care Management Notes (Signed)
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  PT/OT recommending home with assist/outpatient services/rollater walker(she owns) and shower chair(she owns).  Service to give patient a prescription for outpatient PT/OT.    Discharge Plan:  Home (Patient/Family Member/other) (code 1)  Home with no CM needs today.    The patient will continue to be evaluated for developing discharge needs.     Case Manager: Sena Hitch, CLINICAL CARE COORDINATOR  Phone: [phone]

## 2023-05-14 NOTE — Student (Signed)
[facility]  Neurology Progress Note      Name: [name], [name], 52 y.o. female                                              Date of Service: 05/14/2023  NFA:[id number]                                                                                              LOS: 2  QIO:[id number] Rodney Booze, MD                                                                                             Attending: Clovis Cao, MD  Code Status: FULL CODE: ATTEMPT RESUSCITATION/CPR                                                             Admitted for: Autoimmune cerebellar degeneration (CMS [facility])    Hospital Events:    Subjective:  Patient reported she experienced some nausea last night which has since resolved.  No other acute overnight events.  VSS.  Labs within normal limits.  The patient denies any new symptoms.     She reports she has seen improvement in her symptoms since starting Solumedrol.  She denies worsening anxiety.      The patient reports she is ready to return home today, and has family members who can help her once she is home.  She expressed some interest in resuming IVIG in outpatient setting.      Vital Signs:  Temp (24hrs) Max:36.8 C (98.2 F)      Systolic (24hrs), Avg:120 , Min:96 , Max:135     Diastolic (24hrs), Avg:69, Min:56, Max:79    Temp  Avg: 36.6 C (97.9 F)  Min: 36.4 C (97.5 F)  Max: 36.8 C (98.2 F)  MAP (Non-Invasive)  Avg: 82.7 mmHG  Min: 70 mmHG  Max: 90 mmHG  Pulse  Avg: 92.7  Min: 60  Max: 110  Resp  Avg: 17.2  Min: 15  Max: 20  SpO2  Avg: 95.5 %  Min: 91 %  Max: 99 %       Today's Physical Exam:  General:alert  Mental status:Alert and oriented x 3  Memory: Memory is normal to conversation. Follows commands.  Attention: Attention and concentration are normal to conversation.  Knowledge: Appropriate.  Language and Speech: No aphasia. No dysarthria.  Cranial nerves: Cranial  nerves 2-12 are normal  Muscle tone: WNL  Motor strength:  Motor strength is normal  throughout.  Sensory: Sensory exam in the upper and lower extremities is normal  Gait: Normal  Coordination: Finger to nose: abnormal bilateral and Heel to shin: abnormal bilateral  Reflexes: Reflexes are 2/2 throughout    I/O:  I/O last 24 hours:    Intake/Output Summary (Last 24 hours) at 05/14/2023 0908  Last data filed at 05/14/2023 0800  Gross per 24 hour   Intake 2040 ml   Output 1250 ml   Net 790 ml     I/O current shift:  11/22 0700 - 11/22 1859  In: 240 [P.O.:240]  Out: -   LBM:    Outpatient Medications Inpatient Medications   Current Outpatient Medications   Medication Instructions    acetaminophen (TYLENOL) 1,000 mg, Oral, DAILY PRN    amitriptyline (ELAVIL) 25 mg, Oral, NIGHTLY    clonazePAM (KLONOPIN) 1 mg, Oral, NIGHTLY    hydrOXYzine pamoate (VISTARIL) 25 mg, Oral, 3 TIMES DAILY PRN, 1-2 q8h prn headache    Ibuprofen (MOTRIN) 200 mg Oral Tablet 400 - 600 mg daily prn for headache    sertraline (ZOLOFT) 100 mg, Oral, DAILY    SUMAtriptan (IMITREX) 50 mg, Oral, ONCE PRN, May repeat in 2 hours in needed     amitriptyline (ELAVIL) tablet, 25 mg, Oral, NIGHTLY  clonazePAM (klonoPIN) tablet, 1 mg, Oral, NIGHTLY  enoxaparin PF (LOVENOX) 40 mg/0.4 mL SubQ injection, 40 mg, Subcutaneous, Q24H  hydrOXYzine pamoate (VISTARIL) capsule, 25 mg, Oral, Q8H PRN  methylPREDNISolone (SOLU-medrol) 1,000 mg in NS 100 mL IVPB, 1,000 mg, Intravenous, Q24H  ondansetron (ZOFRAN) 2 mg/mL injection, 4 mg, Intravenous, Q8H PRN  sertraline (ZOLOFT) tablet, 100 mg, Oral, Daily            Today's Labs  Reviewed: I have reviewed all lab results.    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 Patient with progressive imbalance and gait instability since January 2022. Previously on monthly  maintenance IVIG, discontinued 1.5 months ago due to side effects. Reports worsening symptoms since stopping therapy. Admitted for further evaluation and management.  She has seen seen improvement in her condition with solumedrol and is currently on her third day of this medication.  Discussed additional treatment with IVIG, patient agreeable to resume in outpatient setting. Discussed lumbar puncture for additional diagnostic data, however, patient declined. Repeat MRI Brain was considered as well, but unlikely to be helpful as no inflammation had been seen previously. Re-ordered MDS2 labs which remain in process. Patient to be given script for prednisone taper on discharge. Also referred to external PT for balance and gait retraining. Stable for home discharge with appropriate meds and follow up.       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   -High-dose solumedrol x 3 days (end 11/22); discharge with steroid taper  -Discussed additional treatment with IVIG, patient agreeable to resume in outpatient setting   -Discussed lumbar puncture for additional diagnostic data, patient declined  -Repeat MRI Brain unlikely to be helpful as no inflammation seen previously  -Ordered MDS2 lab serum - pending     History of Anxiety and Depression  -Resumed home amitriptyline, sertraline, clonazepam QHS, prn vistaril  [name], MSIII  [employer]  [facility]  Department of Neurology  05/14/2023 09:08       This note may have been partially generated using MModal Fluency Direct system, and there may be some incorrect words, spellings, and punctuation that were not noted in checking the note before saving.     See progress note from today  Clovis Cao, MD

## 2023-05-14 NOTE — Progress Notes (Signed)
[facility]  Neurology Progress Note    Lindsey, [name], 52 y.o. female  Date of Admission:  05/12/2023  Date of service: 05/14/2023  Date of Birth:  04-01-1971    Chief Complaint: Autoimmune cerebellar degeneration   Pt's condition today: stable    Subjective: NAEO. Patient seen and examined at bedside. She reports continued improvement on steroids.    Vital Signs:  Temp (24hrs) Max:36.8 C (98.2 F)      Systolic (24hrs), Avg:118 , Min:96 , Max:134     Diastolic (24hrs), Avg:70, Min:56, Max:84    Temp  Avg: 36.7 C (98 F)  Min: 36.4 C (97.5 F)  Max: 36.8 C (98.2 F)  MAP (Non-Invasive)  Avg: 83.5 mmHG  Min: 70 mmHG  Max: 95 mmHG  Pulse  Avg: 93.3  Min: 60  Max: 110  Resp  Avg: 17.8  Min: 15  Max: 20  SpO2  Avg: 95 %  Min: 91 %  Max: 99 %       Today's Physical Exam:  General: Alert  Mental status: Alert and oriented x 3  Memory: Registration, Recall, and Following of commands is normal  Attention: Attention and Concentration are normal  Knowledge: Good  Language and Speech: Normal and Normal  Cranial nerves:   Cranial nerves 2-12 are normal  Muscle tone: WNL  Motor strength: Motor strength is normal throughout.  Sensory: Sensory exam in the upper and lower extremities is normal  Gait: wide based, requires assistance  Coordination: Ataxia with FTN, RAM, HTS bilaterally (L>R) - improved   Reflexes: Reflexes are 2/2 throughout    Current Medications:  amitriptyline (ELAVIL) tablet, 25 mg, Oral, NIGHTLY  clonazePAM (klonoPIN) tablet, 1 mg, Oral, NIGHTLY  enoxaparin PF (LOVENOX) 40 mg/0.4 mL SubQ injection, 40 mg, Subcutaneous, Q24H  hydrOXYzine pamoate (VISTARIL) capsule, 25 mg, Oral, Q8H PRN  methylPREDNISolone (SOLU-medrol) 1,000 mg in NS 100 mL IVPB, 1,000 mg, Intravenous, Q24H  ondansetron (ZOFRAN) 2 mg/mL injection, 4 mg, Intravenous, Q8H PRN  sertraline (ZOLOFT) tablet, 100 mg, Oral, Daily      I/O:  I/O last 24 hours:    Intake/Output Summary (Last 24 hours) at 05/14/2023 0705  Last data filed  at 05/13/2023 2012  Gross per 24 hour   Intake 1800 ml   Output 1250 ml   Net 550 ml     I/O current shift:  No intake/output data recorded.  Last BM: Date of Last Bowel Movement: 05/12/23    Labs:  Reviewed: I have reviewed all lab results.  Lab Results Today:    No results found for any visits on 05/12/23 (from the past 24 hour(s)).      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 Patient with progressive imbalance and gait instability since January 2022. Previously on monthly maintenance IVIG, discontinued 1.5 months ago due to side effects. Reports worsening symptoms since stopping therapy. Admitted for further evaluation and management.       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   -Admit to General Neurology Service  -Vitals and neuro checks Q4H  -Telemetry x 48 hours  -Regular diet  -Start  on high-dose solumedrol x 3 days (end 11/22); discharge with steroid taper  -Discussed additional treatment with IVIG, patient agreeable to resume in outpatient setting   -Discussed lumbar puncture for additional diagnostic data, patient declined  -Repeat MRI Brain unlikely to be helpful as no inflammation seen previously  -Ordered MDS2 lab serum - pending     History of Anxiety and Depression  -Resumed home amitriptyline, sertraline, clonazepam QHS, prn vistaril      DVT/PE Prophylaxis - Enoxaparin  Consults - none  Hardware (Lines, Drains, Foley, Tubes) - PIV  Diet - Regular  Activity - Up w/ assistance and Up in chair TID w/ all meals  Therapy - PT/OT  Disposition - TBD      Daleen Bo, MD  05/14/23  [facility]     Has noticed some improvement with steroids.  Will do taper and follow up. Antibody panel pending    I saw and examined the patient.  I reviewed the resident's note.  I agree with the findings and plan of care as documented in the resident's note.  Any exceptions/additions are edited/noted.    Clovis Cao, MD 05/14/2023, 19:57  Associate Professor  Department of Neurology  [facility]

## 2023-05-14 NOTE — Discharge Summary (Signed)
[facility]  DISCHARGE SUMMARY    PATIENT NAME:  [name], [name]  MRN:  [id number]  DOB:  [birth year]    ENCOUNTER DATE:  05/12/2023  INPATIENT ADMISSION DATE: 05/12/2023  DISCHARGE DATE:  05/14/2023    ATTENDING PHYSICIAN: Clovis Cao, MD  SERVICE: NEUROLOGY 1  PRIMARY CARE PHYSICIAN: Oneal Grout, MD       No lay caregiver identified.    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        Details   famotidine 20 mg Tablet  Commonly known as: PEPCID   20 mg, Oral, 2 TIMES DAILY  Qty: 60 Tablet  Refills: 0     predniSONE 10 mg Tablet  Commonly known as: DELTASONE  Start taking on: May 14, 2023   Take 4 Tablets (40 mg total) by mouth Once a day for 7 days, THEN 3 Tablets (30 mg total) Once a day for 7 days, THEN 2 Tablets (20 mg total) Once a day for 7 days, THEN 1 Tablet (10 mg total) Once a day for 7 days.  Qty: 70 Tablet  Refills: 0            CONTINUE these medications - NO CHANGES were made during your visit.        Details   acetaminophen 500 mg Tablet  Commonly known as: TYLENOL   1,000 mg, Oral, DAILY PRN  Refills: 0     amitriptyline 25 mg Tablet  Commonly known as: ELAVIL   25 mg, Oral, NIGHTLY  Qty: 90 Tablet  Refills: 5     clonazePAM 1 mg Tablet  Commonly known as: klonoPIN   1 mg, Oral, NIGHTLY  Qty: 30 Tablet  Refills: 5     hydrOXYzine pamoate 25 mg Capsule  Commonly known as: VISTARIL   25 mg, Oral, 3 TIMES  DAILY PRN, 1-2 q8h prn headache  Qty: 30 Capsule  Refills: 5     Ibuprofen 200 mg Tablet  Commonly known as: MOTRIN   400 - 600 mg daily prn for headache  Refills: 0     sertraline 100 mg Tablet  Commonly known as: ZOLOFT   100 mg, Oral, DAILY  Qty: 30 Tablet  Refills: 5     SUMAtriptan 50 mg Tablet  Commonly known as: IMITREX   50 mg, Oral, ONCE PRN, May repeat in 2 hours in needed  Qty: 11 Tablet  Refills: 5            Discharge med list refreshed?  YES   NEUROLOGY RISK FACTORS:  -None of the following conditions apply    Allergies   Allergen Reactions    Penicillins Itching, Rash and Swelling     HOSPITAL PROCEDURE(S):   No orders of the defined types were placed in this encounter.      REASON FOR HOSPITALIZATION AND HOSPITAL COURSE  BRIEF HPI:  This is a 52 y.o., White female who presents for autoimmune cerebellar degeneration. Patient with progressive imbalance and gait instability since January 2022. Previously on monthly maintenance IVIG, discontinued 1.5 months ago due to side effects. Reports worsening symptoms since stopping therapy. Admitted for further evaluation and management.     During hospitalization, patient received three day course of high-dose steroid therapy with solumedrol. She reported symptomatic improvement since initiation of above, ataxia on exam also objectively slightly improved. Discussed additional treatment with IVIG, patient agreeable to resume in outpatient setting. Discussed lumbar puncture for additional diagnostic data, however, patient declined. Repeat MRI Brain was considered as well, but unlikely to be helpful as no inflammation had been seen previously. Re-ordered MDS2 labs which remain in process. Patient to be given script for prednisone taper on discharge. Also referred to external PT for balance and gait retraining. Stable for home discharge with appropriate meds and follow up.    TRANSITION/POST DISCHARGE CARE/PENDING TESTS/REFERRALS:   Prednisone taper as  instructed  Please keep scheduled appt for outpatient neurology follow up  Referred to external physical therapy    CONDITION ON DISCHARGE:  A. Ambulation: Full ambulation  B. Self-care Ability: Complete  C. Cognitive Status Alert and Oriented x 3  D. Code status at discharge:       LINES/DRAINS/WOUNDS AT DISCHARGE:   Patient Lines/Drains/Airways Status       Active Line / Dialysis Catheter / Dialysis Graft / Drain / Airway / Wound       Name Placement date Placement time Site Days    Peripheral IV Ultrasound guided;Extended dwell catheter Left Basilic  (medial side of arm) 05/12/23  1321  -- 1                    DISCHARGE DISPOSITION:  Home discharge  DISCHARGE INSTRUCTIONS:  Post-Discharge Follow Up Appointments       Monday Jun 14, 2023    Return Patient Visit with Ronalee Red, APRN,FNP-BC at  1:30 PM      Neurology, [facility]  [address] [facility]  [place] [address]  [phone]             Refer to Physical Therapy-External          Daleen Bo, MD    Copies sent to Care Team         Relationship Specialty Notifications Start End    Shrader, Colin Ina, MD PCP - General FAMILY MEDICINE  02/04/22     Phone: [phone] Fax: [phone]         [address] [place] [address]            Referring providers can utilize [email or web address] to access their referred [facility] Medicine patient's information.

## 2023-05-15 ENCOUNTER — Other Ambulatory Visit (INDEPENDENT_AMBULATORY_CARE_PROVIDER_SITE_OTHER): Payer: Self-pay | Admitting: Neurology

## 2023-05-17 ENCOUNTER — Encounter (INDEPENDENT_AMBULATORY_CARE_PROVIDER_SITE_OTHER): Payer: Self-pay | Admitting: Neurology

## 2023-05-24 LAB — MAYO MISC TEST - REFRIG

## 2023-05-26 ENCOUNTER — Telehealth (INDEPENDENT_AMBULATORY_CARE_PROVIDER_SITE_OTHER): Payer: Self-pay

## 2023-05-26 NOTE — Telephone Encounter (Signed)
Returned triage call to Dwana Curd with [facility] home infusion to update on status of IVIG infusion. Advised we are planning to resume the IVIG infusions at this time. She confirmed the PA is still valid (as are the orders), however they will need the provider to sign new orders and updated clinical documentation before they can schedule. They are faxing over the order to have signed and returned w/ documentation.  Maylon Cos, [place]  05/26/2023, 13:48

## 2023-06-04 ENCOUNTER — Other Ambulatory Visit (INDEPENDENT_AMBULATORY_CARE_PROVIDER_SITE_OTHER): Payer: Self-pay | Admitting: Neurology

## 2023-06-07 ENCOUNTER — Other Ambulatory Visit (INDEPENDENT_AMBULATORY_CARE_PROVIDER_SITE_OTHER): Payer: Self-pay | Admitting: Neurology

## 2023-06-07 MED ORDER — HYDROXYZINE PAMOATE 25 MG CAPSULE
25.0000 mg | ORAL_CAPSULE | Freq: Three times a day (TID) | ORAL | 5 refills | Status: DC | PRN
Start: 2023-06-07 — End: 2023-10-18

## 2023-06-08 ENCOUNTER — Other Ambulatory Visit (INDEPENDENT_AMBULATORY_CARE_PROVIDER_SITE_OTHER): Payer: Self-pay | Admitting: Neurology

## 2023-06-08 DIAGNOSIS — F419 Anxiety disorder, unspecified: Secondary | ICD-10-CM

## 2023-06-09 ENCOUNTER — Other Ambulatory Visit (INDEPENDENT_AMBULATORY_CARE_PROVIDER_SITE_OTHER): Payer: Self-pay | Admitting: Neurology

## 2023-06-11 ENCOUNTER — Encounter (INDEPENDENT_AMBULATORY_CARE_PROVIDER_SITE_OTHER): Payer: Self-pay | Admitting: Neurology

## 2023-06-14 ENCOUNTER — Other Ambulatory Visit: Payer: Self-pay

## 2023-06-14 ENCOUNTER — Ambulatory Visit: Payer: 59 | Attending: Family | Admitting: Family

## 2023-06-14 ENCOUNTER — Encounter (INDEPENDENT_AMBULATORY_CARE_PROVIDER_SITE_OTHER): Payer: Self-pay | Admitting: Family

## 2023-06-14 VITALS — BP 132/68 | HR 85 | Ht 65.0 in | Wt 220.2 lb

## 2023-06-14 DIAGNOSIS — R270 Ataxia, unspecified: Secondary | ICD-10-CM | POA: Insufficient documentation

## 2023-06-14 DIAGNOSIS — Z79899 Other long term (current) drug therapy: Secondary | ICD-10-CM | POA: Insufficient documentation

## 2023-06-14 DIAGNOSIS — Z7952 Long term (current) use of systemic steroids: Secondary | ICD-10-CM

## 2023-06-14 DIAGNOSIS — G319 Degenerative disease of nervous system, unspecified: Secondary | ICD-10-CM | POA: Insufficient documentation

## 2023-06-14 NOTE — Progress Notes (Signed)
[facility] Department of Neurology  Multiple Sclerosis and Clinical Neuroimmunology Clinic      Date: 06/14/2023  Name: [name]  Age:  52 y.o.  Referring Physician:   Oneal Grout, MD  [address]  [place],  [place] [address]    History of Present Illness:  History obtained from:  patient    -----------------------  Please note, some information in this section carried forward from prior notes. 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 Progressively worsening.  -01/2022: Admitted for further workup including LP and likely trial of IVIG and steroids. She declined all three and left the hospital    Pertinent studies:  --MRI brain 11/2021: Per personal review on synapse. There is significant cerebellar atrophy with diffuse hyperintensity on Flair. Review of prior MRI in 05/2021 reveals similar atrophy of the cerebellum.   --CT chest/abd/pelvis 01/2022 (outside): Read as without signs of malignancy  --MRI 02/16/22: Predominantly cerebellar parenchymal volume loss. Otherwise unremarkable supratentorial brain. No acute intracranial process.  I personally reviewed this MRI - I agree that there is cerebellar volume loss. I also do still think there are FLAIR changes in her cerebellum and bilateral cerebellar peduncles.  --Paraneoplastic panel ([facility]) negative in 12/2021. Vitamin E normal.  --Multiple labs in 03/2022 available in Epic  --MRI brain 12/2022: Read as stable, outside pictures not available  yet (checked for pics again on 04/15/2023)    -----------------------    [name] is a 52 y.o. female who returns in follow up for:     Chief Complaint   Patient presents with    Follow Up     132/68      Since last visit:  -Doing better since being back on IVIG (getting q3 weeks)  - tapering down steroids- currently on 15 mg daily   - has appointment at [facility] March 2025  - walking has improved  - still having slurred speech and difficulty swallowing at times   - daughter is a ST so she does help      Past Medical History:   Diagnosis Date    MDD (major depressive disorder)     Restless leg syndrome          Current Outpatient Medications   Medication Sig    acetaminophen (TYLENOL) 500 mg Oral Tablet Take 2 Tablets (1,000 mg total) by mouth Once per day as needed for Pain (headache)    amitriptyline (ELAVIL) 25 mg Oral Tablet Take 1 Tablet (25 mg total) by mouth Every night    clonazePAM (KLONOPIN) 1 mg Oral Tablet TAKE ONE TABLET BY MOUTH AT BEDTIME    hydrOXYzine pamoate (VISTARIL) 25 mg Oral Capsule Take 1 Capsule (25 mg total) by mouth Three times a day as needed for Anxiety (or headache.)    Ibuprofen (MOTRIN) 200 mg Oral Tablet 400 - 600 mg daily prn for headache    sertraline (ZOLOFT) 100 mg Oral Tablet Take 1.5 tabs daily.    SUMAtriptan (IMITREX) 50 mg Oral Tablet Take  1 Tablet (50 mg total) by mouth Once, as needed for Migraine May repeat in 2 hours in needed     Allergies   Allergen Reactions    Penicillins Itching, Rash and Swelling     Family Medical History:    None         Social History     Socioeconomic History    Marital status: Married   Occupational History    Occupation: Ecologist   Tobacco Use    Smoking status: Never    Smokeless tobacco: Never   Vaping Use    Vaping status: Never Used   Substance and Sexual Activity    Alcohol use: Never    Drug use: Never     Social Determinants of Health     Financial Resource Strain: Low Risk  (04/03/2022)    Financial Resource  Strain     SDOH Financial: No   Transportation Needs: Low Risk  (04/03/2022)    Transportation Needs     SDOH Transportation: No   Social Connections: Low Risk  (05/12/2023)    Social Connections     SDOH Social Isolation: 5 or more times a week   Housing Stability: Low Risk  (04/03/2022)    Housing Stability     SDOH Housing Situation: I have housing.     SDOH Housing Worry: No       Review of Systems:  Other than above, no pertinent positives.    PHYSICAL EXAM:    BP 132/68   Pulse 85   Ht 1.651 m (5\' 5" )   Wt 99.9 kg (220 lb 3.8 oz)   BMI 36.65 kg/m         Appearance: No Acute Distress  Orientation: Awake, Alert, and Oriented x 3  Mental status:  Memory:  Attention: Normal  Knowledge: Appropriate  Language: No aphasia  Speech: No dysarthria  Cranial Nerves:  2 No Visual Defect on Confrontation; Pupils round, equal, reactive to light  3,4,6 Extraocular Movements Intact; no nystagmus  5 Facial Sensation Intact  7 No facial asymmetry  8 Intact hearing  9,10 Palate symmetric,normal gag  11 Good shoulder shrug  12 Tongue Midline  Gait: unsteady, wide based, ataxic  Coordination: + ataxia with FTN and HTS  Sensory: Intact, Symmetric  Muscle Tone: Normal  Muscle exam  Arm Right Left Leg Right Left   Deltoid 5/5 5/5 Iliopsoas 5/5 5/5   Biceps 5/5 5/5 Quads 5/5 5/5   Triceps 5/5 5/5 Hamstrings 5/5 5/5   Wrist Extension 5/5 5/5 Ankle Dorsi Flexion 5/5 5/5   Wrist Flexion 5/5 5/5 Ankle Plantar Flexion 5/5 5/5   Interossei 5/5 5/5 Ankle Eversion 5/5 5/5   APB 5/5 5/5 Ankle Inversion 5/5 5/5         Assessment and Plan:  No diagnosis found.    No orders of the defined types were placed in this encounter.      52 y.o. female with autoimmune cerebellar degeneration    - gad antibody negative   - doing better after steroids- currently tapering down- will start 10 mg daily tomorrow- then continue to 5mg  daily then off. Will call us if she has more symptoms- can make taper prolonged  - she previously had concerns for  cellcept and rtx  - will continue on ivig  - no imaging needed unless worsening symptoms arise    - continue PT    Ronalee Red, APRN,FNP-BC

## 2023-06-15 ENCOUNTER — Other Ambulatory Visit (INDEPENDENT_AMBULATORY_CARE_PROVIDER_SITE_OTHER): Payer: Self-pay | Admitting: Neurology

## 2023-06-15 ENCOUNTER — Encounter (INDEPENDENT_AMBULATORY_CARE_PROVIDER_SITE_OTHER): Payer: Self-pay | Admitting: Family

## 2023-06-15 MED ORDER — PREDNISONE 10 MG TABLET
10.0000 mg | ORAL_TABLET | Freq: Every day | ORAL | 5 refills | Status: DC
Start: 2023-06-15 — End: 2024-01-04

## 2023-07-16 ENCOUNTER — Ambulatory Visit (INDEPENDENT_AMBULATORY_CARE_PROVIDER_SITE_OTHER): Payer: Self-pay | Admitting: Neurology

## 2023-07-21 ENCOUNTER — Ambulatory Visit (INDEPENDENT_AMBULATORY_CARE_PROVIDER_SITE_OTHER): Payer: Self-pay | Admitting: Neurology

## 2023-07-21 NOTE — Nursing Note (Signed)
Not received- I faxed [facility] ( apparently faxes only work 1 way) after trying to return call to give them an e-mail they can send the orders to.    Clovis Pu, RN

## 2023-07-21 NOTE — Telephone Encounter (Signed)
Regarding: Clinical Question - Ward  ----- Message from Leslie Andrea sent at 07/20/2023 11:48 AM EST -----  Copied From CRM [phone].  Cross, Lindsey A called with a clinical question.       Have you received the plan of care for IVIG - needs signed and returned.    Odette at [employer]  [phone]  ext [phone]

## 2023-07-23 ENCOUNTER — Encounter (INDEPENDENT_AMBULATORY_CARE_PROVIDER_SITE_OTHER): Payer: Self-pay | Admitting: Neurology

## 2023-10-16 ENCOUNTER — Other Ambulatory Visit (INDEPENDENT_AMBULATORY_CARE_PROVIDER_SITE_OTHER): Payer: Self-pay | Admitting: Neurology

## 2023-11-09 ENCOUNTER — Encounter (INDEPENDENT_AMBULATORY_CARE_PROVIDER_SITE_OTHER): Payer: Self-pay | Admitting: Neurology

## 2023-11-09 ENCOUNTER — Other Ambulatory Visit (INDEPENDENT_AMBULATORY_CARE_PROVIDER_SITE_OTHER): Payer: Self-pay | Admitting: Neurology

## 2023-11-18 ENCOUNTER — Encounter (INDEPENDENT_AMBULATORY_CARE_PROVIDER_SITE_OTHER): Payer: Self-pay

## 2023-11-19 ENCOUNTER — Encounter (INDEPENDENT_AMBULATORY_CARE_PROVIDER_SITE_OTHER): Payer: Self-pay

## 2023-11-22 ENCOUNTER — Ambulatory Visit (INDEPENDENT_AMBULATORY_CARE_PROVIDER_SITE_OTHER): Payer: Self-pay | Admitting: Neurology

## 2023-11-22 NOTE — Telephone Encounter (Addendum)
 No updated PA sent from our end. I faxed this information over to [facility].    Bonne Buster, RN          Regarding: Clinical Meds Auth // [facility]  ----- Message from [place] B sent at 11/19/2023 11:07 AM EDT -----  Copied From CRM [phone].[employer] // Dori H () called about a medication authorization.  Received PA for the Privigen , wanting to make sure there are no changes because there is a current PA good until December, Ref # [id number].

## 2023-12-01 ENCOUNTER — Other Ambulatory Visit (HOSPITAL_COMMUNITY): Payer: Self-pay

## 2023-12-02 NOTE — Discharge Summary (Signed)
 Hospital Medicine Discharge Summary      Demographics:  Josalyn Dettmann  53 y.o. [birth year]  MRN: [id number]      Extended Emergency Contact Information  Primary Emergency Contact: Claybrook,Don  Mobile Phone: [phone]  Relation: Spouse    Full Code    Admit Date: 12/01/2023                            Attending Physician: Thresa Toribio Kocher, *  Discharge Date: 12/02/2023    Primary Care Provider: Provider Not In System   None    Consults during this admission:  Consult Orders                IP CONSULT TO GASTROENTEROLOGY        Specialty:  Gastroenterology  Provider:  (Not yet assigned)                     Active & Resolved Diagnosis:  Principal Problem (Resolved):    Food bolus obstruction of intestine    (CMD)  Active Problems:  There are no active Hospital Problems.    Disposition: Patient discharged to Home in fair condition.         Hospital Course:  Gussie Murton is a 53 y.o. year old female with a PMH of cerebellar degeneration and anxiety who presents with an obstructive food bolus.  Was eating grapes >24hrs prior and swallowed a whole grape.  Subsequently felt a globus sensation in her esophagus.  She tried regurgitating, soda, and drinking water and throwing up without success. She reports her husband works in radiology and was able to get her an OP esophogram which did show a small circular object consistent with a grape at her GEJ.    Status post EGD    Findings  Duodenum- scattered ulcers in sweep and D2  Stomach- green grape present in antrum with antritis. Bx taken  Esophagus- furrowing and mucosal changes most consistent with EOE. Bx taken        Impression  No food impaction  Duodenitis  Gastritis  Esophagitis c/w EoE        Recommendation  Await path  Discharged with pantoprazole twice daily    Wound / Incision Assessment: Refer to Chart Review and Media Tab for images if available.        Vital Sign Range:   Temp:  [97.1 F (36.2 C)-98.4 F (36.9 C)] 97.6 F (36.4 C)  Heart Rate:   [64-102] 76  Resp:  [12-21] 14  BP: (104-160)/(53-106) 104/63           Discharge Medications        New Medications        Sig Disp Refill Start End   pantoprazole 40 mg EC tablet  Commonly known as: PROTONIX   Take 1 tablet (40 mg total) by mouth in the morning and 1 tablet (40 mg total) in the evening. Take before meals.   180 tablet   0              Medications To Continue        Sig Disp Refill Start End   acetaminophen  500 mg tablet  Commonly known as: TYLENOL    Take 1,000 mg by mouth every 6 (six) hours as needed for mild pain (1-3).    0       clonazePAM  1 mg tablet  Commonly known as: KlonoPIN    Take  1 mg by mouth at bedtime.    0       hydrOXYzine  pamoate 25 mg capsule  Commonly known as: VISTARIL    Take 25 mg by mouth every 8 (eight) hours as needed for itching.    0       Privigen  10 % infusion  Generic drug: immune globulin (human)   Infuse 1 g/kg into a venous catheter every 21 days.    0       sertraline  100 mg tablet  Commonly known as: ZOLOFT    Take 150 mg by mouth daily.    0       SUMAtriptan  50 mg tablet  Commonly known as: IMITREX    Take 50 mg by mouth daily as needed for migraine.    0              Stopped Medications      ibuprofen 200 mg tablet  Commonly known as: MOTRIN            Discharge Orders       Full Code      Lifting Limits:      Details:     Lifting Limits: No lifting limits                Lab Results   Component Value Date/Time    HGB 13.6 12/01/2023 07:39 PM    HCT 40.1 12/01/2023 07:39 PM    WBC 6.30 12/01/2023 07:39 PM    PLT 286 12/01/2023 07:39 PM     Lab Results   Component Value Date/Time    NA 138 12/01/2023 07:39 PM    K 4.1 12/01/2023 07:39 PM    CREATININE 0.71 12/01/2023 07:39 PM    BUN 7 12/01/2023 07:39 PM    GLUCOSE 90 12/01/2023 07:39 PM       Pertinent Imaging:  XR Chest 2 Views   Final Result by Rankin Ina Slice, MD (06/11 2000)   XR CHEST 2 VIEWS, 12/01/2023 6:09 PM      INDICATION:esophageal FB    COMPARISON: None.      FINDINGS:   Supportive devices: None     Cardiovascular/lungs/pleura: Normal cardiomediastinal silhouette. No focal    consolidation, pleural effusion or discernible pneumothorax.   Other: Visualized portion of the upper abdomen is unremarkable.  No acute    osseous abnormality.      IMPRESSION:   CONCLUSION:   No acute cardiopulmonary abnormality. No radiopaque foreign body.             Electronically signed by: Derick Furnish, MD 12/02/2023 10:38 AM     Time spent on discharge: 35 minutes

## 2023-12-04 ENCOUNTER — Other Ambulatory Visit (INDEPENDENT_AMBULATORY_CARE_PROVIDER_SITE_OTHER): Payer: Self-pay | Admitting: Neurology

## 2023-12-11 ENCOUNTER — Other Ambulatory Visit (INDEPENDENT_AMBULATORY_CARE_PROVIDER_SITE_OTHER): Payer: Self-pay | Admitting: Neurology

## 2023-12-13 ENCOUNTER — Ambulatory Visit (INDEPENDENT_AMBULATORY_CARE_PROVIDER_SITE_OTHER): Payer: Self-pay | Admitting: Rheumatology

## 2023-12-13 ENCOUNTER — Other Ambulatory Visit: Payer: Self-pay

## 2023-12-13 ENCOUNTER — Ambulatory Visit (INDEPENDENT_AMBULATORY_CARE_PROVIDER_SITE_OTHER): Admitting: Rheumatology

## 2023-12-13 ENCOUNTER — Ambulatory Visit: Payer: Self-pay | Attending: Family | Admitting: Family

## 2023-12-13 ENCOUNTER — Encounter (INDEPENDENT_AMBULATORY_CARE_PROVIDER_SITE_OTHER): Payer: Self-pay | Admitting: Family

## 2023-12-13 VITALS — BP 131/68 | HR 96 | Ht 65.0 in | Wt 218.0 lb

## 2023-12-13 DIAGNOSIS — G319 Degenerative disease of nervous system, unspecified: Secondary | ICD-10-CM

## 2023-12-13 DIAGNOSIS — R270 Ataxia, unspecified: Secondary | ICD-10-CM | POA: Insufficient documentation

## 2023-12-13 DIAGNOSIS — F419 Anxiety disorder, unspecified: Secondary | ICD-10-CM

## 2023-12-13 DIAGNOSIS — R471 Dysarthria and anarthria: Secondary | ICD-10-CM | POA: Insufficient documentation

## 2023-12-13 DIAGNOSIS — G3189 Other specified degenerative diseases of nervous system: Secondary | ICD-10-CM

## 2023-12-13 DIAGNOSIS — R1310 Dysphagia, unspecified: Secondary | ICD-10-CM | POA: Insufficient documentation

## 2023-12-13 MED ORDER — SERTRALINE 100 MG TABLET
150.0000 mg | ORAL_TABLET | Freq: Every day | ORAL | 5 refills | Status: DC
Start: 2023-12-13 — End: 2024-02-01

## 2023-12-13 MED ORDER — CLONAZEPAM 1 MG TABLET
1.5000 mg | ORAL_TABLET | Freq: Every evening | ORAL | 5 refills | Status: DC
Start: 2023-12-13 — End: 2024-03-15

## 2023-12-13 MED ORDER — DALFAMPRIDINE ER 10 MG TABLET,EXTENDED RELEASE,12 HR
10.0000 mg | ORAL_TABLET | Freq: Two times a day (BID) | ORAL | 5 refills | Status: DC
Start: 2023-12-13 — End: 2024-01-04
  Filled 2023-12-21: qty 60, 30d supply, fill #0

## 2023-12-13 NOTE — Progress Notes (Signed)
 [place] Department of Neurology  Multiple Sclerosis and Clinical Neuroimmunology Clinic      Date: 12/13/2023  Name: [name]  Age:  53 y.o.  Referring Physician:   No referring provider defined for this encounter.    History of Present Illness:  History obtained from:  patient    -----------------------  Please note, some information in this section carried forward from prior notes. 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 Progressively worsening.  -01/2022: Admitted for further workup including LP and likely trial of IVIG and steroids. She declined all three and left the hospital    Pertinent studies:  --MRI brain 11/2021: Per personal review on synapse. There is significant cerebellar atrophy with diffuse hyperintensity on Flair. Review of prior MRI in 05/2021 reveals similar atrophy of the cerebellum.   --CT chest/abd/pelvis 01/2022 (outside): Read as without signs of malignancy  --MRI 02/16/22: Predominantly cerebellar parenchymal volume loss. Otherwise unremarkable supratentorial brain. No acute intracranial process.  I personally reviewed this MRI - I agree that there is cerebellar volume loss. I also do still think there are FLAIR changes in her cerebellum and bilateral cerebellar peduncles.  --Paraneoplastic panel ([facility]) negative in 12/2021. Vitamin E normal.  --Multiple labs in 03/2022 available in Epic  --MRI brain 12/2022: Read as  stable    -----------------------    [name] is a 53 y.o. female who returns in follow up for:     Chief Complaint   Patient presents with    Follow Up      Since last visit:  - recently (6/11) got choked on grape. Had EGD with biopsy of ulcers. Started on Protonix   - hasn't received IVIG in a few weeks due to other things  - off all steroids  - feels like things are going well   - walking has improved  - still having slurred speech (slower) and difficulty swallowing at times   - feels that she is over all stable however does not worsening since delaying her IVIG infusion     Saw Dr. Jama at [facility] in May suggested rechecking immunoglobulin levels, anti TPO and movement disorder panels. He also suggested continuing IVIG for now and considering retrying cellcept . Dalfampridine or aminopyridine for ambulation.         Past Medical History:   Diagnosis Date    MDD (major depressive disorder)     Restless leg syndrome          Current Outpatient Medications   Medication Sig    acetaminophen  (TYLENOL ) 500 mg Oral Tablet Take 2 Tablets (1,000 mg total) by mouth Once per day as needed for Pain (headache)    amitriptyline  (ELAVIL ) 25 mg Oral Tablet Take 1 Tablet (25 mg total) by mouth Every night (Patient not taking: Reported on 12/13/2023)    clonazePAM  (KLONOPIN ) 1 mg Oral Tablet Take 1.5 Tablets (1.5 mg total) by mouth Every night    Dalfampridine (AMPYRA) 10 mg Oral Tablet Sustained Release 12  hr Take 1 Tablet (10 mg total) by mouth Every 12 hours    hydrOXYzine  pamoate (VISTARIL ) 25 mg Oral Capsule Take 1 Capsule (25 mg total) by mouth Three times a day as needed for Anxiety (or headache.)    Ibuprofen (MOTRIN) 200 mg Oral Tablet 400 - 600 mg daily prn for headache (Patient not taking: Reported on 12/13/2023)    predniSONE  (DELTASONE ) 10 mg Oral Tablet Take 1 Tablet (10 mg total) by mouth Once a day (Patient not taking: Reported on 12/13/2023)    sertraline  (ZOLOFT ) 100 mg Oral Tablet Take 1.5 Tablets (150 mg total)  by mouth Daily    SUMAtriptan  (IMITREX ) 50 mg Oral Tablet Take 1 Tablet (50 mg total) by mouth Once, as needed for Migraine May repeat in 2 hours in needed     Allergies   Allergen Reactions    Penicillins Itching, Rash and Swelling     Family Medical History:    None         Social History     Socioeconomic History    Marital status: Married   Occupational History    Occupation: Ecologist   Tobacco Use    Smoking status: Never    Smokeless tobacco: Never   Vaping Use    Vaping status: Never Used   Substance and Sexual Activity    Alcohol use: Never    Drug use: Never     Social Determinants of Health     Financial Resource Strain: Low Risk  (04/03/2022)    Financial Resource Strain     SDOH Financial: No   Transportation Needs: Low Risk  (04/03/2022)    Transportation Needs     SDOH Transportation: No   Social Connections: Low Risk  (05/12/2023)    Social Connections     SDOH Social Isolation: 5 or more times a week   Housing Stability: Low Risk  (04/03/2022)    Housing Stability     SDOH Housing Situation: I have housing.     SDOH Housing Worry: No       Review of Systems:  Other than above, no pertinent positives.    PHYSICAL EXAM:    BP 131/68 (Patient Position: Sitting)   Pulse 96   Ht 1.651 m (5' 5)   Wt 98.9 kg (218 lb 0.6 oz)   SpO2 97%   BMI 36.28 kg/m         Appearance: No Acute Distress  Orientation: Awake, Alert, and Oriented x 3  Mental status:  Memory:  Attention: Normal  Knowledge: Appropriate  Language: No aphasia  Speech: No dysarthria  Cranial Nerves:  2 No Visual Defect on Confrontation; Pupils round, equal, reactive to light  3,4,6 Extraocular Movements Intact; no nystagmus  5 Facial Sensation Intact  7 No facial asymmetry  8 Intact hearing  9,10 Palate symmetric,normal gag  11 Good shoulder shrug  12 Tongue Midline  Gait: unsteady, wide based, ataxic  Coordination: + ataxia with FTN and HTS  Sensory: Intact, Symmetric  Muscle Tone: Normal  Muscle exam  Arm Right Left  Leg Right Left   Deltoid 5/5 5/5 Iliopsoas 5/5 5/5   Biceps 5/5 5/5 Quads 5/5 5/5   Triceps 5/5 5/5 Hamstrings 5/5 5/5   Wrist Extension 5/5 5/5 Ankle Dorsi Flexion 5/5 5/5   Wrist Flexion 5/5 5/5 Ankle Plantar Flexion 5/5 5/5   Interossei 5/5 5/5 Ankle Eversion 5/5 5/5   APB 5/5 5/5 Ankle Inversion 5/5 5/5  Assessment and Plan:  Assessment/Plan   1. Cerebellar degeneration (CMS HCC)    2. Ataxia    3. Dysphagia, unspecified type    4. 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 Elevated at [facility]- will repeat today   - TPO antibodies- ?hoshimotos encephalitis-- although treatment is the same  - continue IVIG for now  - consider cellcept  or rituximab in future  - increase klonopin  for anxiety and sleep-- schedule to see psych  - continue Zoloft    - PT/OT/ ST scripts given  - will try to get ampyra approved for walking   - yearly PET ordered     Geni Ponciano Browner, APRN,FNP-BC

## 2023-12-13 NOTE — Telephone Encounter (Signed)
 Prior authorization required for prescription Dalfampridine received by Specialty Pharmacy on 12/13/2023.  Benefits investigation to be completed.  Once provider signs encounter, will submit PA.

## 2023-12-14 ENCOUNTER — Encounter (INDEPENDENT_AMBULATORY_CARE_PROVIDER_SITE_OTHER): Payer: Self-pay | Admitting: Family

## 2023-12-14 LAB — THYROPEROXIDASE (TPO) ANTIBODIES, SERUM: ANTI THYROPEROXIDASE ANTIBODIES: 14 [IU]/mL (ref <=33)

## 2023-12-14 LAB — GAD65 AB ASSAY, S: GAD-65 ANTIBODY: 56 [IU]/mL — ABNORMAL HIGH (ref <5)

## 2023-12-15 ENCOUNTER — Encounter (INDEPENDENT_AMBULATORY_CARE_PROVIDER_SITE_OTHER): Payer: Self-pay | Admitting: Family

## 2023-12-15 ENCOUNTER — Other Ambulatory Visit: Payer: Self-pay

## 2023-12-15 NOTE — Telephone Encounter (Signed)
 Additional information faxed.

## 2023-12-15 NOTE — Telephone Encounter (Signed)
 Benefits investigation completed. Outreach attempt was made to inform that prescription for Dalfampridine  is needing prior authorization.  The medication has been submitted through [facility] Portal. Key [id number]. Waiting for response from payor. Call is also to offer our services that if medication is approved and insurance allows.    Result: Left message to return specialty pharmacy call. If call is returned, please ask for their preferred pharmacy.    Lucie Benders, Pharmacy Technician 12/15/2023

## 2023-12-16 ENCOUNTER — Ambulatory Visit (INDEPENDENT_AMBULATORY_CARE_PROVIDER_SITE_OTHER): Payer: Self-pay | Admitting: Family

## 2023-12-20 ENCOUNTER — Encounter (INDEPENDENT_AMBULATORY_CARE_PROVIDER_SITE_OTHER): Payer: Self-pay | Admitting: Neurology

## 2023-12-21 ENCOUNTER — Other Ambulatory Visit: Payer: Self-pay

## 2023-12-21 NOTE — Telephone Encounter (Signed)
 Prior Authorization for medication Dalfampridine  has been denied on date 12/21/23.  Payor [employer] lists the following reasons for denial: Dalfampridine  is only covered when a pt has a diagnosis of Multiple Sclerosis.. Denial notice has been scanned into Epic and can be found in the Media tab. This signed encounter has been routed to clinic noting the determination of prior authorization and informing that further action is needed from the clinic.    Lucie Benders, Pharmacy Technician

## 2023-12-27 ENCOUNTER — Other Ambulatory Visit: Payer: Self-pay

## 2023-12-28 ENCOUNTER — Encounter (INDEPENDENT_AMBULATORY_CARE_PROVIDER_SITE_OTHER): Payer: Self-pay

## 2023-12-30 ENCOUNTER — Encounter (INDEPENDENT_AMBULATORY_CARE_PROVIDER_SITE_OTHER): Payer: Self-pay | Admitting: Neurology

## 2024-01-04 ENCOUNTER — Other Ambulatory Visit: Payer: Self-pay

## 2024-01-04 ENCOUNTER — Ambulatory Visit (INDEPENDENT_AMBULATORY_CARE_PROVIDER_SITE_OTHER): Payer: Self-pay | Admitting: Psychiatry

## 2024-01-04 ENCOUNTER — Encounter (INDEPENDENT_AMBULATORY_CARE_PROVIDER_SITE_OTHER): Payer: Self-pay

## 2024-01-04 ENCOUNTER — Ambulatory Visit: Payer: Self-pay | Attending: Family

## 2024-01-04 DIAGNOSIS — G0481 Other encephalitis and encephalomyelitis: Secondary | ICD-10-CM

## 2024-01-04 DIAGNOSIS — Z7189 Other specified counseling: Secondary | ICD-10-CM

## 2024-01-04 DIAGNOSIS — G4440 Drug-induced headache, not elsewhere classified, not intractable: Secondary | ICD-10-CM

## 2024-01-04 MED ORDER — CAPVAXIVE 0.5 ML INTRAMUSCULAR SYRINGE
INJECTION | INTRAMUSCULAR | 0 refills | Status: AC
Start: 2024-01-04 — End: ?

## 2024-01-04 MED ORDER — SHINGRIX (PF) 50 MCG/0.5 ML INTRAMUSCULAR SUSPENSION, KIT
INHALATION_SUSPENSION | INTRAMUSCULAR | 1 refills | Status: AC
Start: 2024-01-04 — End: ?

## 2024-01-04 MED ORDER — DALFAMPRIDINE ER 10 MG TABLET,EXTENDED RELEASE,12 HR
10.0000 mg | ORAL_TABLET | Freq: Two times a day (BID) | ORAL | 5 refills | Status: AC
Start: 2024-01-04 — End: ?

## 2024-01-04 NOTE — Progress Notes (Unsigned)
 NEUROLOGY, PHYSICIAN OFFICE CENTER  [address]  [place] [address]  Operated by [facility]  Telephone Visit    Name:  [name]  MRN: [id number]    Date:  01/04/2024  Age:   53 y.o.          The patient/family initiated a request for telephone service.  Verbal consent for this service was obtained from the patient/family.    Patient verbalized consent to working with the clinical pharmacist(s) for the referred disease state(s) in accordance with the approved Collaborative Practice Agreement between the pharmacists and supervising physicians with [facility]. The patient understands all changes in their clinical condition and/or medication regimen will be reported to the physician. Patient acknowledges that they may decline/withdrawal consent to this agreement at any time.      History of Present Illness:  History obtained from:  patient and daughter  ------------------------    [name] is a 53 y.o. female referred by Andrea Sink, MD for autoimmune encephalitis new medication education counseling and immunosuppression meeting.     Taking IVIG every 3 weeks, works better than nothing. Noted a significant negative change when a week late for infusion. Still experiencing headaches, difficulty walking on own, and non-stop ringing in head. Purchased flat headphones similar to those people sometimes use to jog to wear at night. Listening to podcasts/ music to distract sometimes helps to drown out the noise. PET scan last year showed no progression, another scheduled later this year.     When previously planned to start Cellcept  last fall became weary after reading a lot of concerning information online. Primary concern at this point is illness, stays sick as is. Throat is always sore, has been like this on and off since childhood. Husband said lives on antibiotics. While on them feels okay but once stops returns. Has seen an ENT. The ENT is who referred to [facility] for walking, which  resulted in referral here.     Had to go to [facility] ER last month 2/2 choking on a grape. Biopsy of throat ulcers did not find anything concerning.     Received message due for refills of dalfampridine  but not sure it was approved or what to expect from use. Denies history of seizures.     Endorses significant fatigue. Difficulty sleeping for several years. Tried everything can think of to fix, no longer ingests caffeine after 6:00. Medication Rx'd (clonazepam  1 mg) helped initially before benefits subsided. Dose increased to 1.5 mg during last follow up, which also helped initially but effects already subsided. Hopeful psych will be able to help. Scheduled early September.     Headaches occur daily. Thinks partially due to lack of sleep/ significant fatigue. Does not recall using anything to prevent the headaches. Reports likely taking too many OTC pain relievers to combat. Has always used Tylenol , ibuprofen, and Naproxen - ibuprofen also helped sore throat. Now using only Tylenol  after instructions avoid ibuprofen/ NSAIDs because of ulcers. Taking maximum dose Tylenol  every day for headaches. Sometimes provides relief for leg pain as well. Recently having back pain. Never started sumatriptan  as dislikes feeling over medicated.       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    MDD (major depressive disorder)     Restless leg syndrome        Current Outpatient Medications   Medication Sig    acetaminophen  (TYLENOL ) 500 mg Oral Tablet Take 2  Tablets (1,000 mg total) by mouth Every 6 hours as needed for Pain Max 4,000 mg daily Indications: headache, pain    cetirizine (ZYRTEC) 10 mg Oral Tablet Take 1 Tablet (10 mg total) by mouth Daily    clonazePAM  (KLONOPIN ) 1 mg Oral Tablet Take 1.5 Tablets (1.5 mg total) by mouth Every night    hydrOXYzine  pamoate (VISTARIL ) 25 mg Oral Capsule Take 1 Capsule (25 mg total) by mouth Three times a day as needed for Anxiety (or headache.)     immun glob G,IgG,-pro-IgA 0-50 (PRIVIGEN ) 10 % Intravenous Solution infusion Infuse 250 mL (25 g total) into a venous catheter Every 21 days    pantoprazole (PROTONIX) 40 mg Oral Tablet, Delayed Release (E.C.) Take 1 Tablet (40 mg total) by mouth Twice daily    sertraline  (ZOLOFT ) 100 mg Oral Tablet Take 1.5 Tablets (150 mg total) by mouth Daily     Immunization History   Administered Date(s) Administered    Covid-19 Vaccine,Moderna,12 Years+ 06/29/2019, 07/27/2019, 04/19/2020    Tetanus Toxoid/Diphtheria Toxoid/Acellular Pertussis Vaccine, Adsorbed 05/19/2017      Allergies[1]  Social History     Socioeconomic History    Marital status: Married   Occupational History    Occupation: Ecologist   Tobacco Use    Smoking status: Never    Smokeless tobacco: Never   Vaping Use    Vaping status: Never Used   Substance and Sexual Activity    Alcohol use: Never    Drug use: Never     Social Determinants of Health     Financial Resource Strain: Low Risk  (04/03/2022)    Financial Resource Strain     SDOH Financial: No   Transportation Needs: Low Risk  (04/03/2022)    Transportation Needs     SDOH Transportation: No   Social Connections: Low Risk  (05/12/2023)    Social Connections     SDOH Social Isolation: 5 or more times a week   Housing Stability: Low Risk  (04/03/2022)    Housing Stability     SDOH Housing Situation: I have housing.     SDOH Housing Worry: No       Baseline labs:     12/01/2023   WBC  4.40 - 11.00 10*3/uL 6.30   RBC  4.10 - 5.10 10*6/uL 4.89   Hemoglobin  12.3 - 15.3 g/dL 86.3   Hematocrit  64.0 - 44.6 % 40.1   Mean Corpuscular Volume (MCV)  80.0 - 96.0 fL 82.0   Mean Corpuscular Hemoglobin (MCH)  27.5 - 33.2 pg 27.8   Mean Corpuscular Hemoglobin Conc (MCHC)  33.0 - 37.0 g/dL 66.0   Red Cell Distribution Width (RDW)  12.3 - 17.0 % 14.2   Platelet Count (PLT)  150 - 450 10*3/uL 286   Mean Platelet Volume (MPV)  6.8 - 10.2 fL 7.7   Neutrophils % 70   Lymphocytes % 20   Monocytes % 7    Eosinophils % 2   Basophils % 1   nRBC % 0   Neutrophils Absolute  1.80 - 7.80 10*3/uL 4.40   Lymphocytes #  1.00 - 4.80 10*3/uL 1.30   Monocytes #  0.00 - 0.80 10*3/uL 0.40   Eosinophils #  0.00 - 0.50 10*3/uL 0.20   Basophils #  0.00 - 0.20 10*3/uL 0.00   nRBC Absolute  <=0.00 10*3/uL 0.00     Sodium  136 - 145 mmol/L 138   Potassium  3.5 - 5.1 mmol/L 4.1  Comment: NO VISIBLE HEMOLYSIS   Chloride  98 - 107 mmol/L 103   CO2  21 - 31 mmol/L 26   Anion Gap  6 - 14 mmol/L 9   Glucose, Random  70 - 99 mg/dL 90   Blood Urea Nitrogen (BUN)  7 - 25 mg/dL 7   Creatinine  9.39 - 1.20 mg/dL 9.28   eGFR  >40 fO/fpw/8.26f7 >90   Calcium  8.6 - 10.3 mg/dL 9.6         Assessment and Plan    Assessment/Plan   1. Autoimmune encephalitis    2. Encounter for medication review and counseling    3. Medication overuse headache      Orders Placed This Encounter    varicella-zoster, PF, (SHINGRIX , PF,) 50 mcg/0.5 mL IntraMUSCULAR Suspension for Reconstitution    pneumo 21-val conj-dip crm,PF, (CAPVAXIVE ) 0.5 mL IntraMUSCULAR Syringe    Dalfampridine  (AMPYRA ) 10 mg Oral Tablet Sustained Release 12 hr       GAD antibody positive autoimmune encephalitis     Interested in trialing alternative treatment given continued symptoms with IVIG use. Plan to start mycophenolate . Given delayed onset of Cellcept , will plan to continue IVIG and overlap with Cellcept  for at least a few months before attempting to taper off IVIG.  Reviewed possible side effects including but not limited to infections, several lab abnormalities/ corresponding complications, BP changes, and GI upset. Labs will need to be monitored approximately every 4 weeks initially then every 3-6 months thereafter.   [place]anning [place]. She has 5 young grand kids who are always sick. Discussed infection prevention measures such as masking, frequent handwashing, and staying current on immunizations. Likely will not start until after this visit but best to  practice infection prevention measures once treatment is started.     Only recent immunizations recollects are the initial 2 covid vaccines. Previously had shingles. Interested in receiving Shingrix  and pneumococcal vaccine. Highly recommend receiving both prior to starting, will use accelerated vaccine timeline to minimize wait.   Advised not safe to receive live vaccines once starting. Denies plans for international travel.     Still has full bottle of the Cellcept  initially Rx'd last year.  Ok to use this once ready to start. Based on plans for vaccination prior to starting, anticipate start in approximately 6 weeks.     Insurance denied dalfampridine  Rx because only FDA approved to help walking speed in [place]. However we often use off label for non-[place] diagnoses. Agreeable to using cost plus drugs since very unlikely insurance will approve with appeal, Rx sent. Reviewed possible side effects, including risk for seizures and need to monitor renal function periodically. No history of seizures. Emphasized importance of taking doses every 12 hours to avoid accumulation. Most people notice improvement within 2-4 weeks if it works for them. If need to discontinue or no benefit after using for ~1 month, no need to taper.              Medication Reconciliation    Medications reviewed and updated with the patient. Possible interactions of concern with Cellcept  - yes  Possible interaction with PPI. Given need for PPI for ulcer treatment, possible may need to increase Cellcept  dose accordingly. ***given recent ulcers, consider Myfortic instead? Concern for worsening.   Wakes up,eats breakfast + Protonix, ceterizine, may take hydroxyzine  in evening if anxious  Taking max dose of tylenol  every day.   Zoloft  qam   Protonix is  bid before meals for ulcers. Less heart burn @ night.             ***   - discuss headaches: daily, fatigue induced likely to some extent but definitely med overuse headache. CGRP? Prior trial of  amitriptyline     - cost plus drugs    - ulcers, Protonix d/i. Myfortic?       Total provider time spent with the patient on the phone: 45 minutes.  Nevelyn Roof, PHARMD           [1]   Allergies  Allergen Reactions    Penicillins Itching, Rash and Swelling

## 2024-01-07 ENCOUNTER — Encounter (INDEPENDENT_AMBULATORY_CARE_PROVIDER_SITE_OTHER): Payer: Self-pay

## 2024-02-01 ENCOUNTER — Telehealth (INDEPENDENT_AMBULATORY_CARE_PROVIDER_SITE_OTHER): Admitting: Psychiatry

## 2024-02-01 ENCOUNTER — Other Ambulatory Visit: Payer: Self-pay

## 2024-02-01 DIAGNOSIS — F411 Generalized anxiety disorder: Secondary | ICD-10-CM

## 2024-02-01 DIAGNOSIS — F418 Other specified anxiety disorders: Secondary | ICD-10-CM

## 2024-02-01 DIAGNOSIS — F32A Depression, unspecified: Secondary | ICD-10-CM

## 2024-02-01 MED ORDER — PRAZOSIN 1 MG CAPSULE
ORAL_CAPSULE | ORAL | 0 refills | Status: DC
Start: 2024-02-01 — End: 2024-03-08

## 2024-02-01 MED ORDER — SERTRALINE 50 MG TABLET
ORAL_TABLET | ORAL | 0 refills | Status: DC
Start: 2024-02-01 — End: 2024-03-08

## 2024-02-01 MED ORDER — BUPROPION HCL XL 150 MG 24 HR TABLET, EXTENDED RELEASE
150.0000 mg | ORAL_TABLET | Freq: Every morning | ORAL | 1 refills | Status: DC
Start: 2024-02-01 — End: 2024-03-08

## 2024-02-01 NOTE — Patient Instructions (Signed)
 To cross taper from Zoloft  to Wellbutrin  you will  Week 1: take Zoloft  100 mg with Wellbutrin  XL 100 mg  Week 2 take Zoloft  50 mg (cont Wellbutrin )  Week 3 take Zoloft  25 mg (cont Wellbutrin )  Week 4 stop Zoloft  and only take Wellbutrin 

## 2024-02-01 NOTE — Progress Notes (Signed)
 [facility]  [address]  [place] [address]           PSYCHIATRY HISTORY AND PHYSICAL EXAM       Name: [name]  MRN: [id number]  Date Of Birth: 19-Jun-1971  Date of Evaluation: 02/01/24    TELEMEDICINE DOCUMENTATION:    Patient Location:  MyChart video visit from home address: [address]  [place] [address]    Patient/family aware of provider location:  yes  Patient/family consent for telemedicine:  yes  Examination observed and performed by:  Elliott Frater, MD      Chief Complaint:   Chief Complaint   Patient presents with    Establish Care       History of Present Illness: This is a 53 y.o. female from [place] area) presenting for initial evaluation. 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  Also GERD which is improved with Protonix (Pt reports). PPH includes anxiety on clonazepam  and Zoloft .     Regarding mood symptoms, can't sleep. After 2pm no caffeine. Wears a cooling cap to help with pain and tinnitus and blocks all the light. Takes clonazepam  1.5mg  at night from neurology. At times hasn't been able to sleep all night. Unable to climb steps but has first floor bedroom. Last night slept about 6 hours (2:30 - 8:30) which is an excellent night sleep. Mind wont' shut off when it's time to sleep. +sad about her situation nothing turned out how it should have been. Denies anhedonia. Misses the kids and grieving how her life should have been. Can't drive. Walking is a little better but requires assistance. +talks in sleep, has terrible dreams +fights in sleep. Reports clonazepam  isn't doing anything for sleep. Doesn't tell family how she's struggling to protect them. Sometimes speaks with someone at [email or web address] a service her daughter also uses but it's not regular therapy. Reports +40lb weight gain since starting Zoloft . Been taking it 5 years or so. Started by PCP when started having crying outbursts which at  the time they didn't realize was the start of the cerebellar degeneration.     Regarding anxiety symptoms, +anxiety, +panic attacks any time in a medical situation or even the dentist office. Anxiety started with the cerebellar degeneration sx.     Regarding symptoms of PTSD, denies abuse. Only traumatic experience was losing mother quickly to cancer. 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     Regarding symptoms of psychosis, denied AVH. Doesn't appear to be responding to IS    Regarding substance abuse, no concerns.     Denies SI/HI intent or plan. No other safety concerns voiced.    Past Psychiatric History:  Current and past outpatient providers: first time. Monthly therapy online from [email or web address] she believes it's called. Same service her daughter uses. Attends monthly online bible study which she feels is therapeutic also.  Prior hospitalizations for psychiatric reasons: denied  Suicide attempts: denied  Medication trials: only Zoloft  and clonazepam .     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      Current Medications:   Current Outpatient Medications   Medication Sig  acetaminophen  (TYLENOL ) 500 mg Oral Tablet Take 2 Tablets (1,000 mg total) by mouth Every 6 hours as needed for Pain Max 4,000 mg daily Indications: headache, pain    cetirizine (ZYRTEC) 10 mg Oral Tablet Take 1 Tablet (10 mg total) by mouth Daily    clonazePAM  (KLONOPIN ) 1 mg Oral Tablet Take 1.5 Tablets (1.5 mg total) by mouth Every night    Dalfampridine  (AMPYRA ) 10 mg Oral Tablet Sustained Release 12 hr Take 1 Tablet (10 mg total) by mouth Every 12 hours    hydrOXYzine  pamoate (VISTARIL ) 25 mg Oral Capsule Take 1 Capsule (25 mg total) by mouth Three times a day as needed for Anxiety (or  headache.)    immun glob G,IgG,-pro-IgA 0-50 (PRIVIGEN ) 10 % Intravenous Solution infusion Infuse 250 mL (25 g total) into a venous catheter Every 21 days    pantoprazole (PROTONIX) 40 mg Oral Tablet, Delayed Release (E.C.) Take 1 Tablet (40 mg total) by mouth Twice daily    pneumo 21-val conj-dip crm,PF, (CAPVAXIVE ) 0.5 mL IntraMUSCULAR Syringe Inject 0.5 mL IM once prior to starting immunosuppressive therapy Indications: Streptococcus pneumoniae vaccination, prior to starting immunosuppressant    sertraline  (ZOLOFT ) 100 mg Oral Tablet Take 1.5 Tablets (150 mg total) by mouth Daily    varicella-zoster, PF, (SHINGRIX , PF,) 50 mcg/0.5 mL IntraMUSCULAR Suspension for Reconstitution Inject 0.5 mL into the muscle. Administer second dose at least 4 weeks after dose 1 (package insert 2.3) Indications: shingles vaccination, prior to starting immunosuppressant     Allergies: penicillin    denied history of seizures   denied history of severe head injury    Social History:  Patient lives with. Husband married 30 years. 3 kids. Daughter speech therapist. Son in grad school at [employer]. Youngest quit college, working and lives with she and husband. Works at a Industrial/product designer. Husband is a Engineer, structural.     Husband, sister and son are supportive to the patient.    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    Highest Level of Education: working on masters when got sick.     Substances:  Smoking: denied all  Alcohol: none drinks per n/a. Had a champagne toast when son graduated undergrad 5 years ago. Doesn't like how alcohol makes her feel.   Other drugs: denied    Family History:  Mother a little anxiety. Daughter some anxiety.  Drug use d/o: denied  Suicide: denied      Review of Systems:  General: No fevers, chills, malaise, change in weight  HEENT: No headache, no sore throat, no rhinorrhea, no change in vision or hearing  Cardiovascular: No chest pain, no  palpitations  Respiratory: No shortness of breath, no cough  GI: No nausea, no vomiting, no diarrhea  GU: No hematuria, no dysuria  Musculoskeletal: No myalgias,  no arthralgias  Skin: No new rashes or lesions  Neurological: No numbness, tingling, or weakness  Endocrine: No history of diabetes or thyroid  disease    Physical Exam:   There were no vitals taken for this visit.        General: No acute distress  HEENT: Normocephalic atraumatic  Heart: No perioral cyanosis, no peripheral edema  Lungs: No labored breathing  Abdomen: Non-distended  Extremities: full AROM in all extremities, no deformity  Neurologic: Cranial nerves II-XII grossly intact, gait not observed  Skin: No rashes or lesions on exposed skin    Mental Status Examination  Appearance:  Casually dressed, well groomed, good hygiene, make up  Behavior: Pleasant and cooperative. Very Tearful.   Level of consciousness: Alert  Orientation: Grossly normal  Motor: No movement abnormalities  Speech: Regular rate, rhythm, and volume  Attention: Adequate for conversation, no excessive distractibility  Eye contact: Good  Memory: sufficient for interview.   Mood: struggling  Affect: broad range. Dysphoric.   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 Pt/guardian aware that calls are returned up to 24 hours after message left so if need is urgent/acute to report to the nearest ED for immediate assistance.   Meds: grief and sadness about how her life has changed. 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 At night these thoughts become overwhelming and interfere with sleep. Sleep quality also poor with talking, bad dreams and fighting. Panic attacks related to medical visits. Possible PTSD related to her health condition. Zoloft  ineffective and also +40lb weight gain. Clonazepam  does nothing but also can't stop without taper. For now will cross taper Zoloft  to Wellbutrin , cont clonazepam  at hs (consider taper and switch to another agent), and start minipress  ramp for NM/sleep disturbance. Discussed r/b/ase and she consents. Knows to stop ramp if has ASE or finds effective dose.   Labs: none  Therapy: encouraged her to ramp visits up to at least every other week (ideally weekly). Pt protects those around her so doesn't share her distress with anyone so it comes out full force after dark when all are sleeping except for her.   Lifestyle encouraged brief moments of self-care when they can and how frequent small bouts of this can be powerful even when too busy for more.  Follow up: 66mo    Helmut Hennon, MD

## 2024-02-08 ENCOUNTER — Encounter (INDEPENDENT_AMBULATORY_CARE_PROVIDER_SITE_OTHER): Payer: Self-pay | Admitting: Psychiatry

## 2024-02-11 ENCOUNTER — Encounter (INDEPENDENT_AMBULATORY_CARE_PROVIDER_SITE_OTHER): Payer: Self-pay | Admitting: Family

## 2024-02-14 MED ORDER — PROCHLORPERAZINE MALEATE 10 MG TABLET
10.0000 mg | ORAL_TABLET | Freq: Four times a day (QID) | ORAL | 3 refills | Status: AC | PRN
Start: 2024-02-14 — End: ?

## 2024-02-19 ENCOUNTER — Encounter (INDEPENDENT_AMBULATORY_CARE_PROVIDER_SITE_OTHER): Payer: Self-pay | Admitting: Neurology

## 2024-02-22 ENCOUNTER — Ambulatory Visit (INDEPENDENT_AMBULATORY_CARE_PROVIDER_SITE_OTHER): Payer: PRIVATE HEALTH INSURANCE | Admitting: Psychiatry

## 2024-02-23 ENCOUNTER — Ambulatory Visit (INDEPENDENT_AMBULATORY_CARE_PROVIDER_SITE_OTHER): Payer: Self-pay

## 2024-02-23 ENCOUNTER — Encounter (INDEPENDENT_AMBULATORY_CARE_PROVIDER_SITE_OTHER): Payer: Self-pay | Admitting: Neurology

## 2024-02-23 NOTE — Telephone Encounter (Addendum)
 Message sent to provider regarding IV access. Consider [place] vs SQ > port placement since goal is to eventually D/C once stable on Cellcept .   Nevelyn Roof, PHARMD  02/23/2024, 14:17      ----- Message from Nevelyn DEL, PHARMD sent at 02/23/2024  2:15 PM EDT -----  Regarding: FW: [facility]  ----- Message from Arland HERO sent at 02/22/2024 12:00 PM EDT -----  Copied From CRM #[id number].[facility] / [facility] (Pharmacy) called with a clinical question.     [facility] is calling and states pt missed her IVIG and this is the 2nd time she missed it.   She said that the nurse goes to start the infusion but they are unable to get a line started.   Can you please call her.    Thanks,  Arland

## 2024-02-29 ENCOUNTER — Encounter (INDEPENDENT_AMBULATORY_CARE_PROVIDER_SITE_OTHER): Payer: Self-pay

## 2024-02-29 ENCOUNTER — Encounter (INDEPENDENT_AMBULATORY_CARE_PROVIDER_SITE_OTHER): Payer: PRIVATE HEALTH INSURANCE | Admitting: Psychiatry

## 2024-02-29 NOTE — Progress Notes (Signed)
 Switching IVIG to SCIG given venous access difficulties. Dose will be approximately 23 g every 14 days. Orders faxed to [facility].  Nevelyn Roof, PHARMD  02/29/2024, 12:29

## 2024-03-01 ENCOUNTER — Encounter (INDEPENDENT_AMBULATORY_CARE_PROVIDER_SITE_OTHER): Payer: Self-pay | Admitting: Psychiatry

## 2024-03-03 ENCOUNTER — Ambulatory Visit (INDEPENDENT_AMBULATORY_CARE_PROVIDER_SITE_OTHER): Payer: Self-pay

## 2024-03-03 NOTE — Telephone Encounter (Addendum)
 Returned call.   Received order for SCIG but needs confirmation okay to round to nearest vial size. Advised we assumed this was automatically going to be done per protocol since order states this will happen. Representative confirmed it is rounded, however they have to get the provider's signature confirming it is acceptable before moving forward. Will attempt to submit for 20 grams of one of the 20% SCIG formulations once updated order received. Faxing over updated order.  Nevelyn Roof, [place]  03/03/2024, 15:23    ----- Message from Camellia BIRCH, NURSE COORDINATOR sent at 03/03/2024  7:26 AM EDT -----  Regarding: FWBETHA Cross  ----- Message from [name] at 03/02/2024 11:48 AM EDT -----  Copied From CRM [phone].[name] (Self) called to request Cross prescription refill.     [facility] calling on patients behalf  Please clarify dose of this medication --  immun glob G,IgG,-pro-IgA 0-50 (PRIVIGEN ) 10 % Intravenous Solution infusion      CB: [phone]

## 2024-03-08 ENCOUNTER — Telehealth: Payer: Self-pay | Admitting: Psychiatry

## 2024-03-08 DIAGNOSIS — F411 Generalized anxiety disorder: Secondary | ICD-10-CM

## 2024-03-08 DIAGNOSIS — F418 Other specified anxiety disorders: Secondary | ICD-10-CM

## 2024-03-08 DIAGNOSIS — F32A Depression, unspecified: Secondary | ICD-10-CM

## 2024-03-08 MED ORDER — BUPROPION HCL XL 300 MG 24 HR TABLET, EXTENDED RELEASE: 300.0000 mg | ORAL_TABLET | Freq: Every morning | ORAL | 2 refills | Status: DC

## 2024-03-08 MED ORDER — SERTRALINE 50 MG TABLET: 50.0000 mg | ORAL_TABLET | Freq: Every day | ORAL | 3 refills | Status: DC

## 2024-03-08 NOTE — Progress Notes (Signed)
 Psychiatry Outpatient F/U  [facility] MEDICAL  [address]  [place] [address]       Name: [name] MRN:  [id number]   Date: 03/08/2024 Age: 53 y.o.       Time Spent for Patient Encounter: 25 min     TELEMEDICINE DOCUMENTATION:    Patient Location:  MyChart video visit from home address: [address]  [place] [address]    Patient/family aware of provider location:  yes  Patient/family consent for telemedicine:  yes  Examination observed and performed by:  Lindsey Frater, MD        Chief Complaint/Reason for consult: follow up      ID/Subjective:  [name] is a 53 y.o. year-old female presenting for med management follow up. Last visit cross tapered Zoloft  (ineffective/weight gain) to Wellbutrin , minipress  ramp for PTSD related NM and cont clonazepam . Today reports:    Mood/anxiety -tried twice with Wellbutrin  felt over medicated and had a lot of GI symptoms. At this time taking both Zoloft  50 mg and Wellbutrin . By 5pm is more in the devil feels it's wearing off. Finds herself ruminating on her diagnosis and situation more. Late for her IVIG (was due 8/26) which was switched to SQ due to new nurse unable to get IV access. Notices a little lift to her mood with the addn of Wellbutrin . Says is trying hard to stay active -goes out with husband after he gets home from work and sometimes just goes outside at their own house. Tries to walk with walker as much as she can but it's nothing like before obviously. Notes that being in nature and out of the house is helpful for her mood. Encouraged her to continue this.    Sleep - reports that minipress  caused incontinence. Still taking her clonazepam  at hs.    Appetite - stable    Taking meds as prescribed. Denies missed or extra doses. Tolerating well and denies ASE.     Denies HI/SI intent or plan. No other safety concerns voiced.      Current Outpatient Medications   Medication Sig    acetaminophen  (TYLENOL ) 500 mg Oral Tablet Take 2  Tablets (1,000 mg total) by mouth Every 6 hours as needed for Pain Max 4,000 mg daily Indications: headache, pain    buPROPion  (WELLBUTRIN  XL) 150 mg extended release 24 hr tablet Take 1 Tablet (150 mg total) by mouth Every morning    cetirizine (ZYRTEC) 10 mg Oral Tablet Take 1 Tablet (10 mg total) by mouth Daily    clonazePAM  (KLONOPIN ) 1 mg Oral Tablet Take 1.5 Tablets (1.5 mg total) by mouth Every night    Dalfampridine  (AMPYRA ) 10 mg Oral Tablet Sustained Release 12 hr Take 1 Tablet (10 mg total) by mouth Every 12 hours    hydrOXYzine  pamoate (VISTARIL ) 25 mg Oral Capsule Take 1 Capsule (25 mg total) by mouth Three times a day as needed for Anxiety (or headache.)    immun glob G,IgG,-pro-IgA 0-50 (PRIVIGEN ) 10 % Intravenous Solution infusion Infuse 250 mL (25 g total) into a venous catheter Every 21 days    pneumo 21-val conj-dip crm,PF, (CAPVAXIVE ) 0.5 mL IntraMUSCULAR Syringe Inject 0.5 mL IM once prior to starting immunosuppressive therapy Indications: Streptococcus pneumoniae vaccination, prior to starting immunosuppressant    prochlorperazine  (COMPAZINE ) 10 mg Oral Tablet Take 1 Tablet (10 mg total) by mouth Every 6 hours as needed for Nausea/Vomiting    sertraline  (ZOLOFT ) 50 mg Oral Tablet Take 100 mg  Zoloft  x 1 week then take 50 mg Zoloft  x 1 week then take 25 mg Zoloft  x 1 week then stop Zoloft  and continue Wellbutrin .    varicella-zoster, PF, (SHINGRIX , PF,) 50 mcg/0.5 mL IntraMUSCULAR Suspension for Reconstitution Inject 0.5 mL into the muscle. Administer second dose at least 4 weeks after dose 1 (package insert 2.3) Indications: shingles vaccination, prior to starting immunosuppressant     Review of Systems:  General: (-) fever, (-) chills, (-)night sweats, (-) fatigue, (-)weakness, (-) changes in appetite  Skin:  (-) rashes  HEENT: (-) H/A  Cardiac: (-) CP   Respiratory: (-) cough, (-) SOB  Gastrointestinal: (-) N/V or diarrhea  Musculoskeletal: (-) pain  Neurologic: (-) headaches, (-)  dizziness  Psychiatric: see HPI  All other systems are negative      Objective:    Exam:  There were no vitals taken for this visit.    Physical Exam:  General: No acute distress  HEENT: Normocephalic atraumatic  Heart: No perioral cyanosis, no peripheral edema  Lungs: No labored breathing  Abdomen: Non-distended  Extremities: full AROM in all extremities, no deformity  Neurologic: Cranial nerves II-XII grossly intact, gait not observed d/t video  Skin: No rashes or lesions on exposed skin    Mental Status Examination  Appearance: well groomed. Well dressed. Good hygeine  Behavior: Pleasant and cooperative  Level of consciousness: Alert  Orientation: Grossly normal  Motor: No movement abnormalities   Speech: Regular rate, rhythm, and volume  Attention: Adequate for conversation, no excessive distractibility  Eye contact: Good  Mood: good until the evening  Affect: euthymic overall  Thought process: Linear and goal-directed  Thought content: Appropriate, no paranoia, no delusions  Perception: No audiovisual hallucinations  Suicide ideation: see HPI  Homicide ideation: see HPI  Fund of knowledge: average  Insight: Fair  Judgment: Fair      Labs:   Urine drug screen: none    BOP Reviewed: yes    Diagnoses:  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 Pt/guardian aware that calls are returned up to 24 hours after message left so if need is urgent/acute to report to the nearest ED for immediate assistance.   Meds: increase Wellbutrin  XL to 300 mg. Cont Zoloft  50 mg. D/c prazocin. Cont clonazepam  (has refills on file)  Labs: none  Therapy: prn/cont  Lifestyle: encouraged brief moments of self-care when they can and how frequent small bouts of this can be powerful even when too busy for more.  Follow up: 54mo

## 2024-03-10 ENCOUNTER — Ambulatory Visit (INDEPENDENT_AMBULATORY_CARE_PROVIDER_SITE_OTHER): Payer: Self-pay

## 2024-03-10 NOTE — Telephone Encounter (Addendum)
 Discussing with patient via MyChart messages. Insurance changing to [employer] Cross/B + new secondary insurance. Highly recommend updating [facility] with new insurance details ASAP. If [facility] is unable to bill, will have to receive IVIG in infusion center setting.   Has Cross dose for 10/2. Would need next dose (either SCIG or IVIG through infusion center) coordinated by 10/16.  [name], [place]  03/10/2024, 16:35      ----- Message from Andrea Sink, MD sent at 03/10/2024  1:51 PM EDT -----  Regarding: RE: ward  ----- Message from [name] at 03/08/2024 10:48 AM EDT -----  Copied From CRM #[id number].  [name] (Self) called with Cross clinical question.       This medication is only approved up to 9/30 - immun glob G,IgG,-pro-IgA 0-50 (PRIVIGEN ) 10 % Intravenous Solution infusion    But patient doesn't have insurance past 9/30  Please advise if you want to cancel request    [employer]  CB: [phone] ext [phone]

## 2024-03-13 ENCOUNTER — Encounter (INDEPENDENT_AMBULATORY_CARE_PROVIDER_SITE_OTHER): Payer: Self-pay

## 2024-03-15 ENCOUNTER — Encounter (INDEPENDENT_AMBULATORY_CARE_PROVIDER_SITE_OTHER): Payer: Self-pay | Admitting: Family

## 2024-03-15 MED ORDER — CLONAZEPAM 2 MG TABLET
1.5000 mg | ORAL_TABLET | Freq: Every evening | ORAL | 0 refills | Status: DC
Start: 2024-03-15 — End: 2024-04-12

## 2024-03-20 ENCOUNTER — Encounter (INDEPENDENT_AMBULATORY_CARE_PROVIDER_SITE_OTHER): Payer: Self-pay

## 2024-04-05 ENCOUNTER — Other Ambulatory Visit (INDEPENDENT_AMBULATORY_CARE_PROVIDER_SITE_OTHER): Payer: Self-pay | Admitting: Neurology

## 2024-04-05 ENCOUNTER — Encounter (INDEPENDENT_AMBULATORY_CARE_PROVIDER_SITE_OTHER): Payer: Self-pay | Admitting: Neurology

## 2024-04-05 MED ORDER — DIAZEPAM 5 MG TABLET: 5.0000 mg | ORAL_TABLET | ORAL | 0 refills | Status: DC

## 2024-04-06 ENCOUNTER — Encounter (INDEPENDENT_AMBULATORY_CARE_PROVIDER_SITE_OTHER): Payer: Self-pay

## 2024-04-11 ENCOUNTER — Ambulatory Visit (INDEPENDENT_AMBULATORY_CARE_PROVIDER_SITE_OTHER): Payer: Self-pay

## 2024-04-11 NOTE — Telephone Encounter (Addendum)
 Updated medication list to reflect switch. Received first dose 03/09/24.  Nevelyn Roof, [place]  04/11/2024, 16:35      ----- Message from Camellia BIRCH, NURSE COORDINATOR sent at 04/11/2024  9:55 AM EDT -----  Regarding: FW: Referral Prior Auth - [facility]  ----- Message from Cindia DEL sent at 04/11/2024  9:55 AM EDT -----  Copied From CRM #[id number].  Erica with [employer] (Other) called about a prior authorization.       Medication Hizentra has been approved     PA # [id number]    Dates 10.8.25 thru 10.8.26    Call back # [phone] ext [phone]

## 2024-04-12 ENCOUNTER — Telehealth: Payer: Self-pay | Admitting: Psychiatry

## 2024-04-12 ENCOUNTER — Encounter (INDEPENDENT_AMBULATORY_CARE_PROVIDER_SITE_OTHER): Payer: Self-pay | Admitting: Family

## 2024-04-12 DIAGNOSIS — F411 Generalized anxiety disorder: Secondary | ICD-10-CM

## 2024-04-12 DIAGNOSIS — F32A Depression, unspecified: Secondary | ICD-10-CM

## 2024-04-12 DIAGNOSIS — F419 Anxiety disorder, unspecified: Secondary | ICD-10-CM

## 2024-04-12 MED ORDER — CLONAZEPAM 1 MG TABLET: 1.5000 mg | ORAL_TABLET | Freq: Every evening | ORAL | 2 refills | Status: DC

## 2024-04-12 NOTE — Progress Notes (Signed)
 Psychiatry Outpatient F/U  [facility]  [address]  [place] [address]       Name: [name] MRN:  [id number]   Date: 04/12/2024 Age: 53 y.o.       Time Spent for Patient Encounter: 25 min     TELEMEDICINE DOCUMENTATION:    Patient Location:  MyChart video visit from home address: [address]  [place] [address]    Patient/family aware of provider location:  yes  Patient/family consent for telemedicine:  yes  Examination observed and performed by:  Elliott Frater, MD        Chief Complaint/Reason for consult: follow up      ID/Subjective:  [name] is a 53 y.o. year-old female presenting for med management follow up. Last visit increased Wellbutrin  XL to 300 mg, cont Zoloft  and PRN clonazepam . Today reports:    Mood/anxiety - is over 6 weeks behind with her infusion. This was due to review/PA taking all this time. Experiencing a lot of physical weakness, don't feel normal, had to go back to using a walker. Finds that evening 4pm through the evening feels more upset. Husband does a good job taking care of her, but finds it upsetting to lose her role making dinner etc.     Sleep - takes clonazepam  1.5mg  qhs    Appetite - a lot of diarrhea    Taking meds as prescribed. Denies missed or extra doses. Tolerating well and denies ASE.     Denies HI/SI intent or plan. No other safety concerns voiced.      Current Outpatient Medications   Medication Sig    acetaminophen  (TYLENOL ) 500 mg Oral Tablet Take 2 Tablets (1,000 mg total) by mouth Every 6 hours as needed for Pain Max 4,000 mg daily Indications: headache, pain    buPROPion  (WELLBUTRIN  XL) 300 mg extended release 24 hr tablet Take 1 Tablet (300 mg total) by mouth Every morning    cetirizine (ZYRTEC) 10 mg Oral Tablet Take 1 Tablet (10 mg total) by mouth Daily    clonazePAM  (KLONOPIN ) 2 mg Oral Tablet Take 0.75 Tablets (1.5 mg total) by mouth Every night    Dalfampridine  (AMPYRA ) 10 mg Oral Tablet Sustained Release 12 hr Take  1 Tablet (10 mg total) by mouth Every 12 hours    diazePAM  (VALIUM ) 5 mg Oral Tablet Take 1 Tablet (5 mg total) by mouth Per instructions . Take one pill 60 min before scan. Take another 15 min before if needed.    hydrOXYzine  pamoate (VISTARIL ) 25 mg Oral Capsule Take 1 Capsule (25 mg total) by mouth Three times a day as needed for Anxiety (or headache.)    immun glob G,IgG,/pro/IgA 0-50 (HIZENTRA SUBQ) Inject 20 g under the skin Every 14 days    pneumo 21-val conj-dip crm,PF, (CAPVAXIVE ) 0.5 mL IntraMUSCULAR Syringe Inject 0.5 mL IM once prior to starting immunosuppressive therapy Indications: Streptococcus pneumoniae vaccination, prior to starting immunosuppressant    prochlorperazine  (COMPAZINE ) 10 mg Oral Tablet Take 1 Tablet (10 mg total) by mouth Every 6 hours as needed for Nausea/Vomiting    sertraline  (ZOLOFT ) 50 mg Oral Tablet Take 1 Tablet (50 mg total) by mouth Daily    varicella-zoster, PF, (SHINGRIX , PF,) 50 mcg/0.5 mL IntraMUSCULAR Suspension for Reconstitution Inject 0.5 mL into the muscle. Administer second dose at least 4 weeks after dose 1 (package insert 2.3) Indications: shingles vaccination, prior to starting immunosuppressant     Review of Systems:  General: (-)  fever, (-) chills, (-)night sweats, (-) fatigue, (-)weakness, (-) changes in appetite  Skin:  (-) rashes  HEENT: (-) H/A  Cardiac: (-) CP   Respiratory: (-) cough, (-) SOB  Gastrointestinal: (-) N/V or diarrhea  Musculoskeletal: (-) pain  Neurologic: (-) headaches, (-) dizziness  Psychiatric: see HPI  All other systems are negative      Objective:    Exam:  There were no vitals taken for this visit.    Physical Exam:  General: No acute distress  HEENT: Normocephalic atraumatic  Heart: No perioral cyanosis, no peripheral edema  Lungs: No labored breathing  Abdomen: Non-distended  Extremities: full AROM in all extremities, no deformity  Neurologic: Cranial nerves II-XII grossly intact, gait not observed d/t video  Skin: No rashes or  lesions on exposed skin    Mental Status Examination  Appearance: fair grooming and hygeine  Behavior: Pleasant and cooperative  Level of consciousness: Alert  Orientation: Grossly normal  Motor: No movement abnormalities   Speech: Regular rate, rhythm, and volume  Attention: Adequate for conversation, no excessive distractibility  Eye contact: Good  Mood: struggling  Affect: full range. Dysphoric at times.  Thought process: Linear and goal-directed  Thought content: Appropriate, no paranoia, no delusions  Perception: No audiovisual hallucinations  Suicide ideation: see HPI  Homicide ideation: see HPI  Fund of knowledge: average  Insight: Fair  Judgment: Fair      Labs:   Urine drug screen: none    BOP Reviewed: yes    Diagnoses:  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 Pt/guardian aware that calls are returned up to 24 hours after message left so if need is urgent/acute to report to the nearest ED for immediate assistance.   Meds: significant symptoms as she is about 6 weeks overdue for her monthly infusion. Physically weak, back to using walker, in bathroom constantly. These symptoms along with waiting on PA have caused a significant increase to anxiety and low mood understandably. Given that she is not a her baseline physically it's difficult to assess effect of Wellbutrin  increase. Will cont current for now. Restored clonazepam  script back to old strength. Discussed provider leaving [facility] 06/22/24 and TOC plan.  Labs: none  Therapy: PRN  Lifestyle: encouraged brief moments of self-care when they can and how frequent small bouts of this can be powerful even when too busy for more.  Follow up: 6-8wks

## 2024-04-18 ENCOUNTER — Encounter (INDEPENDENT_AMBULATORY_CARE_PROVIDER_SITE_OTHER): Payer: Self-pay

## 2024-04-18 LAB — COLOGUARD® COLON CANCER SCREEN: COLOGUARD RESULT: NEGATIVE

## 2024-04-21 ENCOUNTER — Encounter (INDEPENDENT_AMBULATORY_CARE_PROVIDER_SITE_OTHER): Payer: Self-pay

## 2024-04-21 ENCOUNTER — Encounter (INDEPENDENT_AMBULATORY_CARE_PROVIDER_SITE_OTHER): Payer: Self-pay | Admitting: Neurology

## 2024-04-28 ENCOUNTER — Encounter (INDEPENDENT_AMBULATORY_CARE_PROVIDER_SITE_OTHER): Payer: Self-pay | Admitting: Neurology

## 2024-04-28 NOTE — Telephone Encounter (Signed)
 ----- Message from Shawn P sent at 04/28/2024  2:03 PM EST -----  Regarding: RE: 3rd Denial - PET RU:ANIB (HEAD TO THIGH) WO IV CONTRAST!  Geni Belton, the appeal will not be completed by 05/04/2024 they may want to reschedule.    Shawn  ----- Message -----  From: Clydell Geni Fabry, APRN, CNP  Sent: 04/28/2024   1:57 PM EST  To: Elouise CHRISTELLA Pan  Subject: RE: 3rd Denial - PET RU:ANIB (HEAD TO THIGH)#    Dr. Neomi placed an appeal letter.     Geni Fabry Clydell, APRN, CNP  ----- Message -----  From: Pan Elouise CHRISTELLA  Sent: 04/28/2024   1:39 PM EST  To: Geni Fabry Clydell, APRN, CNP  Subject: 3rd Denial - PET RU:ANIB (HEAD TO THIGH) WO #    Denzil Mceachron,      Test is scheduled on 05/04/2024. How do you want to proceed? Cancel/ Reschedule    Detailed Denial Reason Further imaging can be done for any of these reasons:  -The patient has and abnormality on prior imaging that is located in a region that is hard to get to in order to biopsy.  -Results of prior imaging did not show the doctor what they needed to see.  -Proteins that may be related to a tumor were noted on lab testing when standard imaging (CT/MRI) did not show a primary site.  The notes sent to us  do not describe any of these reasons for imaging.    Peer to Peer Availability:  Peer to Peer is available, but is not able to be scheduled with this patient's insurance plan. Please call this phone number [phone] to complete P2P as soon as you are able. Please let us  know the insurance plans final decision once the P2P has been completed.    Thank you!  Shawn  ----- Message -----  From: Pan Elouise CHRISTELLA  Sent: 04/26/2024   3:31 PM EST  To: Geni Fabry Clydell, APRN, CNP  Subject: 2nd Denial - PET RU:ANIB (HEAD TO THIGH) WO #    Earley Grobe,      Test is scheduled on 05/04/2024.    Detailed Denial Reason Further imaging can be done for any of these reasons:  -The patient has and abnormality on prior imaging that is located in a region that is hard to get to in  order to biopsy.  -Results of prior imaging did not show the doctor what they needed to see.  -Proteins that may be related to a tumor were noted on lab testing when standard imaging (CT/MRI) did not show a primary site.  The notes sent to us  do not describe any of these reasons for imaging.    Peer to Peer Availability:  Peer to Peer is available, but is not able to be scheduled with this patient's insurance plan. Please call this phone number [phone] to complete P2P as soon as you are able. Please let us  know the insurance plans final decision once the P2P has been completed.    Thank you!  Shawn  ----- Message -----  From: Pan Elouise CHRISTELLA  Sent: 04/21/2024  12:19 PM EST  To: Geni Fabry Clydell, APRN, CNP  Subject: Denial - PET RU:ANIB (HEAD TO THIGH) WO IV C#    Fronie Holstein,      Detailed Denial Reason Further imaging can be done for any of these reasons:  -The patient has and abnormality on prior imaging that is located in a region that is  hard to get to in order to biopsy.  -Results of prior imaging did not show the doctor what they needed to see.  -Proteins that may be related to a tumor were noted on lab testing when standard imaging (CT/MRI) did not show a primary site.  The notes sent to us  do not describe any of these reasons for imaging.    Peer to Peer Availability:  Peer to Peer is available, but is not able to be scheduled with this patient's insurance plan. Please call this phone number [phone] to complete P2P as soon as you are able. Please let us  know the insurance plans final decision once the P2P has been completed.      DENIAL MESSAGE TYPE:[id number]]- PLEASE RESPOND WITH YOUR DECISION AS SOON AS POSSIBLE  PLEASE ADVISE: IF YOU WISH TO HAVE THE PATIENT'S UPCOMING APPOINTMENT CANCELED OR RESCHEDULED TO ALLOW FOR MORE TIME, PLEASE REACH OUT TO THE SCHEDULING DEPARTMENT BELOW ASAP AND CONTACT THE PATIENT ON THE NEXT STEPS.    Patient: [name]  MRN: [id number]    Yes - See appointment  information below    Schedule Status  Clinic: Imaging Services, [facility] Phone: [phone]  Provider: Alban PET Appt Date/Time: Thursday May 04, 2024  5:15 PM       Date Authorization Was Submitted:   04/17/24   INITIAL DENIAL NOTICE- PLEASE RESPOND WITH YOUR DECISION AS SOON AS POSSIBLE  PLEASE ADVISE: IF YOU WISH TO HAVE THE PATIENT'S UPCOMING APPOINTMENT CANCELED OR RESCHEDULED TO ALLOW FOR MORE TIME, PLEASE REACH OUT TO THE SCHEDULING DEPARTMENT BELOW ASAP AND CONTACT THE PATIENT ON THE NEXT STEPS.    Insurance Plan    [employer] ID # [id number]  Ordering Physician:  Geni Ponciano Browner  Denied Test with Ordered Date:    PET RU:ANIB (HEAD TO THIGH) WO IV CONTRAST [[id number]] ordered on - 12/13/23  PB PET, WITH CT, SKL BSE TO MID THIGH [[id number]] ordered on - 12/13/23      Additional Notes if Available:    Reference/ Case [id number]    Detailed Denial Reason Further imaging can be done for any of these reasons:  -The patient has and abnormality on prior imaging that is located in a region that is hard to get to in order to biopsy.  -Results of prior imaging did not show the doctor what they needed to see.  -Proteins that may be related to a tumor were noted on lab testing when standard imaging (CT/MRI) did not show a primary site.  The notes sent to us  do not describe any of these reasons for imaging.    Peer to Peer Availability:  Peer to Peer is available, but is not able to be scheduled with this patient's insurance plan. Please call this phone number [phone] to complete P2P as soon as you are able. Please let us  know the insurance plans final decision once the P2P has been completed.      Attachment: Denial Letter     MyChart Quick Action message sent to patient Yes - MyChart message sent to patient      Thank you!

## 2024-04-30 ENCOUNTER — Encounter (INDEPENDENT_AMBULATORY_CARE_PROVIDER_SITE_OTHER): Payer: Self-pay | Admitting: Neurology

## 2024-05-04 ENCOUNTER — Ambulatory Visit

## 2024-05-11 ENCOUNTER — Ambulatory Visit

## 2024-05-30 ENCOUNTER — Encounter (INDEPENDENT_AMBULATORY_CARE_PROVIDER_SITE_OTHER): Payer: Self-pay | Admitting: Psychiatry

## 2024-05-30 ENCOUNTER — Encounter (INDEPENDENT_AMBULATORY_CARE_PROVIDER_SITE_OTHER): Payer: Self-pay | Admitting: Neurology

## 2024-05-31 ENCOUNTER — Encounter (INDEPENDENT_AMBULATORY_CARE_PROVIDER_SITE_OTHER): Payer: Self-pay | Admitting: Psychiatry

## 2024-05-31 ENCOUNTER — Encounter (INDEPENDENT_AMBULATORY_CARE_PROVIDER_SITE_OTHER): Payer: Self-pay | Admitting: Neurology

## 2024-05-31 ENCOUNTER — Other Ambulatory Visit (INDEPENDENT_AMBULATORY_CARE_PROVIDER_SITE_OTHER): Payer: Self-pay | Admitting: Neurology

## 2024-05-31 MED ORDER — BACLOFEN 10 MG TABLET: 10.0000 mg | ORAL_TABLET | Freq: Two times a day (BID) | ORAL | 5 refills | Status: AC | PRN

## 2024-05-31 MED ORDER — BUPROPION HCL XL 300 MG 24 HR TABLET, EXTENDED RELEASE: 300.0000 mg | ORAL_TABLET | Freq: Every morning | ORAL | 2 refills | Status: AC

## 2024-05-31 NOTE — Telephone Encounter (Signed)
 Wellbutrin  refilled. Refill sent to preferred pharmacy. See orders for more detail. Follow up as scheduled.  Alexandra Lipps, MD

## 2024-06-05 NOTE — Progress Notes (Unsigned)
  Department of Neurology  Multiple Sclerosis and Clinical Neuroimmunology Clinic      Date: 06/06/2024  Name: [name]  Age:  53 y.o.  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 Progressively worsening.  -01/2022: Admitted for further workup including LP and likely trial of IVIG and steroids. She declined all three and left the hospital    Pertinent studies:  --MRI brain 11/2021: Per personal review on synapse. There is significant cerebellar atrophy with diffuse hyperintensity on Flair. Review of prior MRI in 05/2021 reveals similar atrophy of the cerebellum.   --CT chest/abd/pelvis 01/2022 (outside): Read as without signs of malignancy  --MRI 02/16/22: Predominantly cerebellar parenchymal volume loss. Otherwise unremarkable supratentorial brain. No acute intracranial process.  I personally reviewed this MRI - I agree that there is cerebellar volume loss. I also do still think there are FLAIR changes in her cerebellum and bilateral cerebellar peduncles.  --Paraneoplastic panel ([facility]) negative in 12/2021. Vitamin E normal.  --Multiple labs in 03/2022 available in Epic  --MRI brain 12/2022: Read as stable, need to get outside pictures.  -- [facility] ab panel was unremarkable on 05/12/2023  -- GAD65 ab positive 12/13/23, not remarkably- 54    -----------------------    [name] is a 53 y.o. female who returns in follow up for   Chief Complaint   Patient presents with    Follow Up 6  Months      Since last visit:  - Has been doing subq IG treatment q 2 weeks now 6 treatments so far, they feel like it has not been helpful at all, no side affects endorsed. Cognitionwise she is doing well but she if just not feeling good.    - Symtoms have been getting worse, increased falls has to use walker again now, emotional outbursts, urinary incontience. Increased choking with food. 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    MDD (major depressive disorder)     Restless leg syndrome          Current Outpatient Medications   Medication Sig    acetaminophen  (TYLENOL ) 500 mg Oral Tablet Take 2 Tablets (1,000 mg total) by mouth Every 6 hours as needed for Pain Max 4,000 mg daily Indications: headache, pain    baclofen  (LIORESAL ) 10 mg Oral Tablet Take 1 Tablet (10 mg total) by mouth Twice per day as needed (Back pain) (Patient not taking: Reported on 06/06/2024)    buPROPion  (WELLBUTRIN  XL) 300 mg extended release 24 hr tablet Take 1 Tablet (300 mg total) by mouth Every morning    cetirizine (ZYRTEC) 10 mg Oral Tablet Take 1 Tablet (10 mg total) by mouth Daily    clonazePAM  (KLONOPIN ) 1 mg Oral Tablet Take 1.5 Tablets (1.5 mg total) by mouth Every night    Dalfampridine  (AMPYRA ) 10 mg Oral Tablet Sustained Release 12 hr Take 1 Tablet (10 mg  total) by mouth Every 12 hours    diazePAM  (VALIUM ) 5 mg Oral Tablet Take 1 Tablet (5 mg total) by mouth Per instructions . Take one pill 60 min before scan. Take another 15 min before if needed.    hydrOXYzine  pamoate (VISTARIL ) 25 mg Oral Capsule Take 1 Capsule (25 mg total) by mouth Three times a day as needed for Anxiety (or headache.)    immun glob G,IgG,/pro/IgA 0-50 (HIZENTRA SUBQ) Inject 20 g under the skin Every 14 days    pneumo 21-val conj-dip crm,PF, (CAPVAXIVE ) 0.5 mL IntraMUSCULAR Syringe Inject 0.5 mL IM once prior to starting immunosuppressive therapy Indications: Streptococcus pneumoniae  vaccination, prior to starting immunosuppressant    prochlorperazine  (COMPAZINE ) 10 mg Oral Tablet Take 1 Tablet (10 mg total) by mouth Every 6 hours as needed for Nausea/Vomiting    varicella-zoster, PF, (SHINGRIX , PF,) 50 mcg/0.5 mL IntraMUSCULAR Suspension for Reconstitution Inject 0.5 mL into the muscle. Administer second dose at least 4 weeks after dose 1 (package insert 2.3) Indications: shingles vaccination, prior to starting immunosuppressant     Allergies   Allergen Reactions    Penicillins Rash, Itching and Swelling     Was a baby     Family Medical History:    None         Social History     Socioeconomic History    Marital status: Married   Occupational History    Occupation: Ecologist   Tobacco Use    Smoking status: Never    Smokeless tobacco: Never   Vaping Use    Vaping status: Never Used   Substance and Sexual Activity    Alcohol use: Never    Drug use: Never     Social Determinants of Health     Financial Resource Strain: Low Risk (04/03/2022)    Financial Resource Strain     SDOH Financial: No   Transportation Needs: Low Risk (04/03/2022)    Transportation Needs     SDOH Transportation: No   Social Connections: Low Risk (05/12/2023)    Social Connections     SDOH Social Isolation: 5 or more times a week   Housing Stability: Low Risk (04/03/2022)    Housing Stability     SDOH Housing Situation: I have housing.     SDOH Housing Worry: No       Review of Systems:  Other than above, no pertinent positives.    Examination:    Vitals: BP 138/76   Pulse 84   Ht 1.651 m (5' 5)   Wt 97.8 kg (215 lb 9.8 oz)   SpO2 99%   BMI 35.88 kg/m       General Exam:  General: No acute distress.  HEENT: No scleral icterus.  Psychiatric: Tearful.    Neurologic Exam:  Orientation: Awake, alert, appropriately oriented.  Memory: Recent and remote memory appear intact. Formal memory testing not completed today.  Attention: Normal in conversation.  Knowledge: Appropriate.  Language: Fluent with  intact comprehension.  Speech: Normal.  Cranial nerves: Extraocular movements are intact. Face activates symmetrically. Hearing intact to conversation.   Sensory: Intact to light, touch, vibration, and proprioception in bilateral great toes  Muscle tone: Normal.    Muscle exam:  Arm Right Left Leg Right Left   Deltoid 5/5 5/5 Iliopsoas 5/5 5/5   Biceps 5/5 5/5 Quads 5/5 5/5   Triceps 5/5 5/5 Hamstrings 5/5 5/5      Ankle Dorsi Flexion 5/5 5/5  Reflexes: Toes downgoing bilaterally.    Coordination: Finger-nose without dysmetria bilaterally.  Gait: Wide-based, ataxic.    Assessment and Plan:    Assessment/Plan   1. 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 Continue amitriptyline  to 25 mg nightly for headaches, sleep, pain, and mood. Can increase as needed. She declined LP previously. Updated labs; GAD65 ab positive  [facility] ab panel was unremarkable. Ordered PET scan to exclude underlying malignancy even though no clear autoantibody has been determined yet; however insurance declined. Repeat MRI brain ordered to see progression of degeneration. Genetics referral placed to have an opinion regarding pathophysiology of cerebellar degeneration- genetic vs. Autoimmune. Patient was offered to be started on Rituximab while continuing SQIG, as immunoglobulin therapy seems to provide minimal benefit. Cellcept  was a different agent that we offered with slower onset of action, per previous discussions patient would like to defer cellcept  and will consider rituximab therapy.     Orders Placed This Encounter    MRI BRAIN W/WO CONTRAST    Refer to [facility]    diazePAM  (VALIUM ) 5 mg Oral Tablet     Patient is scheduled to see us  back in March 2026.     Elise Simmer, MD 06/06/2024

## 2024-06-06 ENCOUNTER — Other Ambulatory Visit: Payer: Self-pay

## 2024-06-06 ENCOUNTER — Ambulatory Visit: Attending: Neurology | Admitting: Neurology

## 2024-06-06 ENCOUNTER — Encounter (INDEPENDENT_AMBULATORY_CARE_PROVIDER_SITE_OTHER): Payer: Self-pay | Admitting: Neurology

## 2024-06-06 VITALS — BP 138/76 | HR 84 | Ht 65.0 in | Wt 215.6 lb

## 2024-06-06 DIAGNOSIS — G0481 Other encephalitis and encephalomyelitis: Secondary | ICD-10-CM | POA: Insufficient documentation

## 2024-06-06 DIAGNOSIS — G3189 Other specified degenerative diseases of nervous system: Secondary | ICD-10-CM

## 2024-06-06 DIAGNOSIS — Z8661 Personal history of infections of the central nervous system: Secondary | ICD-10-CM

## 2024-06-06 MED ORDER — DIAZEPAM 5 MG TABLET: 5.0000 mg | ORAL_TABLET | ORAL | 0 refills | Status: AC

## 2024-06-13 ENCOUNTER — Encounter (HOSPITAL_COMMUNITY): Payer: Self-pay | Admitting: Neurology

## 2024-06-13 ENCOUNTER — Encounter (INDEPENDENT_AMBULATORY_CARE_PROVIDER_SITE_OTHER): Payer: Self-pay | Admitting: Neurology

## 2024-06-13 ENCOUNTER — Encounter (INDEPENDENT_AMBULATORY_CARE_PROVIDER_SITE_OTHER): Payer: Self-pay

## 2024-06-13 ENCOUNTER — Other Ambulatory Visit (INDEPENDENT_AMBULATORY_CARE_PROVIDER_SITE_OTHER): Payer: Self-pay | Admitting: Neurology

## 2024-06-26 ENCOUNTER — Encounter (INDEPENDENT_AMBULATORY_CARE_PROVIDER_SITE_OTHER): Payer: Self-pay | Admitting: Neurology

## 2024-06-27 ENCOUNTER — Encounter (HOSPITAL_COMMUNITY): Payer: Self-pay | Admitting: Neurology

## 2024-06-28 ENCOUNTER — Encounter (HOSPITAL_COMMUNITY): Payer: Self-pay | Admitting: Neurology

## 2024-06-28 ENCOUNTER — Encounter (INDEPENDENT_AMBULATORY_CARE_PROVIDER_SITE_OTHER): Payer: Self-pay | Admitting: Neurology

## 2024-06-29 ENCOUNTER — Encounter (HOSPITAL_COMMUNITY): Payer: Self-pay | Admitting: Neurology

## 2024-06-29 ENCOUNTER — Ambulatory Visit (INDEPENDENT_AMBULATORY_CARE_PROVIDER_SITE_OTHER): Payer: Self-pay | Admitting: Neurology

## 2024-06-29 NOTE — Telephone Encounter (Addendum)
 Faxed    Regarding: Clinical Question  ----- Message from Amy W sent at 06/29/2024 10:36 AM EST -----  Copied From CRM #[id number].Susan//Home Health called to request most recent visit notes. Fax [phone]  Ph [phone]

## 2024-06-30 ENCOUNTER — Other Ambulatory Visit (INDEPENDENT_AMBULATORY_CARE_PROVIDER_SITE_OTHER): Payer: Self-pay | Admitting: Neurology

## 2024-06-30 ENCOUNTER — Encounter (HOSPITAL_COMMUNITY): Payer: Self-pay | Admitting: Neurology

## 2024-06-30 MED ORDER — CLONAZEPAM 1 MG TABLET: 1.5000 mg | ORAL_TABLET | Freq: Every evening | ORAL | 0 refills | Status: AC

## 2024-07-03 ENCOUNTER — Encounter (HOSPITAL_COMMUNITY): Payer: Self-pay | Admitting: Neurology

## 2024-07-04 ENCOUNTER — Ambulatory Visit (INDEPENDENT_AMBULATORY_CARE_PROVIDER_SITE_OTHER): Payer: Self-pay | Admitting: Neurology

## 2024-07-04 ENCOUNTER — Encounter (HOSPITAL_COMMUNITY): Payer: Self-pay | Admitting: Neurology

## 2024-07-04 NOTE — Telephone Encounter (Addendum)
 Verified ok to move it       Regarding: Clinical Question   Ward  ----- Message from Suzen D sent at 07/04/2024  1:03 PM EST -----  Copied From CRM #[id number].  [name] (Other) called with a clinical question.     [name] is calling from [facility] to ask if someone can give her a verbal order to move the first appt to next Friday so the family can be present please call her @ [phone]     you can leave a VM if you do not reach her

## 2024-07-05 ENCOUNTER — Encounter (HOSPITAL_COMMUNITY): Payer: Self-pay | Admitting: Neurology

## 2024-07-09 ENCOUNTER — Encounter (INDEPENDENT_AMBULATORY_CARE_PROVIDER_SITE_OTHER): Payer: Self-pay | Admitting: Neurology

## 2024-07-13 ENCOUNTER — Encounter (HOSPITAL_COMMUNITY): Payer: Self-pay | Admitting: Neurology

## 2024-07-13 ENCOUNTER — Telehealth: Payer: Self-pay

## 2024-07-13 ENCOUNTER — Encounter (INDEPENDENT_AMBULATORY_CARE_PROVIDER_SITE_OTHER): Payer: Self-pay

## 2024-07-13 DIAGNOSIS — F419 Anxiety disorder, unspecified: Secondary | ICD-10-CM

## 2024-07-13 DIAGNOSIS — F331 Major depressive disorder, recurrent, moderate: Secondary | ICD-10-CM

## 2024-07-13 MED ORDER — VENLAFAXINE ER 37.5 MG CAPSULE,EXTENDED RELEASE 24 HR: ORAL_CAPSULE | ORAL | 1 refills | Status: AC

## 2024-07-21 ENCOUNTER — Other Ambulatory Visit (INDEPENDENT_AMBULATORY_CARE_PROVIDER_SITE_OTHER): Payer: Self-pay | Admitting: Neurology

## 2024-07-21 DIAGNOSIS — F331 Major depressive disorder, recurrent, moderate: Secondary | ICD-10-CM | POA: Insufficient documentation

## 2024-07-21 DIAGNOSIS — R270 Ataxia, unspecified: Secondary | ICD-10-CM

## 2024-07-21 DIAGNOSIS — F419 Anxiety disorder, unspecified: Secondary | ICD-10-CM | POA: Insufficient documentation

## 2024-07-24 ENCOUNTER — Ambulatory Visit (INDEPENDENT_AMBULATORY_CARE_PROVIDER_SITE_OTHER): Payer: Self-pay | Admitting: Neurology

## 2024-07-24 NOTE — Telephone Encounter (Addendum)
 Verbal order given and Rollator/Walker order faxed    Camellia LITTIE Sallies, NURSE COORDINATOR         ----- Message from Andrea Sink, MD sent at 07/21/2024  1:28 PM EST -----  Regarding: RE: Clinical Question   Clydell  ----- Message from Englewood D sent at 07/14/2024  2:29 PM EST -----  Copied From CRM #[id number].[name] (Self) called with a clinical question.     Landry is calling from PT she is wanting a verbal order for OT and also a rolling walker please call her @ [phone] you can leave a voicemail for her also

## 2024-08-04 ENCOUNTER — Encounter (INDEPENDENT_AMBULATORY_CARE_PROVIDER_SITE_OTHER): Payer: Self-pay

## 2024-09-07 ENCOUNTER — Ambulatory Visit (INDEPENDENT_AMBULATORY_CARE_PROVIDER_SITE_OTHER): Payer: Self-pay | Admitting: Neurology

## 2024-09-21 ENCOUNTER — Ambulatory Visit (INDEPENDENT_AMBULATORY_CARE_PROVIDER_SITE_OTHER): Admitting: Neurology

## 2025-02-01 ENCOUNTER — Ambulatory Visit (INDEPENDENT_AMBULATORY_CARE_PROVIDER_SITE_OTHER): Admitting: Neurology
